# Patient Record
Sex: Female | Born: 1945
Health system: Southern US, Community
[De-identification: ages and names within clinical notes are randomized; demographics above are authoritative.]

## PROBLEM LIST (undated history)

## (undated) ENCOUNTER — Emergency Department (HOSPITAL_COMMUNITY): Admission: EM | Discharge status: Against Medical Advice | Payer: PPO | Source: Home / Self Care

## (undated) DIAGNOSIS — I7121 Aneurysm of the ascending aorta, without rupture: Secondary | ICD-10-CM

## (undated) DIAGNOSIS — E039 Hypothyroidism, unspecified: Secondary | ICD-10-CM

## (undated) DIAGNOSIS — I739 Peripheral vascular disease, unspecified: Secondary | ICD-10-CM

## (undated) DIAGNOSIS — I712 Thoracic aortic aneurysm, without rupture: Secondary | ICD-10-CM

## (undated) DIAGNOSIS — N289 Disorder of kidney and ureter, unspecified: Secondary | ICD-10-CM

## (undated) DIAGNOSIS — I2510 Atherosclerotic heart disease of native coronary artery without angina pectoris: Secondary | ICD-10-CM

## (undated) DIAGNOSIS — I779 Disorder of arteries and arterioles, unspecified: Secondary | ICD-10-CM

## (undated) DIAGNOSIS — I4510 Unspecified right bundle-branch block: Secondary | ICD-10-CM

## (undated) DIAGNOSIS — E785 Hyperlipidemia, unspecified: Secondary | ICD-10-CM

## (undated) DIAGNOSIS — Z789 Other specified health status: Secondary | ICD-10-CM

## (undated) DIAGNOSIS — I351 Nonrheumatic aortic (valve) insufficiency: Secondary | ICD-10-CM

## (undated) DIAGNOSIS — R011 Cardiac murmur, unspecified: Secondary | ICD-10-CM

## (undated) HISTORY — PX: ABDOMINAL HYSTERECTOMY: SHX81

## (undated) HISTORY — DX: Nonrheumatic aortic (valve) insufficiency: I35.1

## (undated) HISTORY — DX: Disorder of kidney and ureter, unspecified: N28.9

## (undated) HISTORY — PX: TONSILLECTOMY: SUR1361

## (undated) HISTORY — DX: Other specified health status: Z78.9

## (undated) HISTORY — PX: BREAST EXCISIONAL BIOPSY: SUR124

## (undated) HISTORY — DX: Thoracic aortic aneurysm, without rupture: I71.2

## (undated) HISTORY — DX: Disorder of arteries and arterioles, unspecified: I77.9

## (undated) HISTORY — DX: Peripheral vascular disease, unspecified: I73.9

## (undated) HISTORY — DX: Unspecified right bundle-branch block: I45.10

## (undated) HISTORY — DX: Aneurysm of the ascending aorta, without rupture: I71.21

## (undated) HISTORY — DX: Hyperlipidemia, unspecified: E78.5

---

## 1999-11-13 ENCOUNTER — Emergency Department (HOSPITAL_COMMUNITY): Admission: EM | Admit: 1999-11-13 | Discharge: 1999-11-14 | Payer: Self-pay | Admitting: Emergency Medicine

## 2000-05-26 ENCOUNTER — Other Ambulatory Visit: Admission: RE | Admit: 2000-05-26 | Discharge: 2000-05-26 | Payer: Self-pay | Admitting: Gynecology

## 2001-06-22 ENCOUNTER — Ambulatory Visit (HOSPITAL_COMMUNITY): Admission: RE | Admit: 2001-06-22 | Discharge: 2001-06-22 | Payer: Self-pay | Admitting: *Deleted

## 2001-06-22 ENCOUNTER — Encounter: Payer: Self-pay | Admitting: *Deleted

## 2002-11-07 ENCOUNTER — Encounter: Payer: Self-pay | Admitting: Family Medicine

## 2002-11-07 ENCOUNTER — Ambulatory Visit (HOSPITAL_COMMUNITY): Admission: RE | Admit: 2002-11-07 | Discharge: 2002-11-07 | Payer: Self-pay | Admitting: Family Medicine

## 2002-12-13 ENCOUNTER — Encounter: Payer: Self-pay | Admitting: Family Medicine

## 2002-12-13 ENCOUNTER — Ambulatory Visit (HOSPITAL_COMMUNITY): Admission: RE | Admit: 2002-12-13 | Discharge: 2002-12-13 | Payer: Self-pay | Admitting: Family Medicine

## 2011-01-20 ENCOUNTER — Encounter: Payer: Self-pay | Admitting: Internal Medicine

## 2011-01-21 ENCOUNTER — Encounter (HOSPITAL_COMMUNITY): Admission: RE | Admit: 2011-01-21 | Payer: Self-pay | Source: Home / Self Care | Admitting: Internal Medicine

## 2011-01-29 ENCOUNTER — Encounter: Payer: Self-pay | Admitting: Internal Medicine

## 2011-01-29 ENCOUNTER — Other Ambulatory Visit (HOSPITAL_COMMUNITY): Payer: Self-pay | Admitting: Internal Medicine

## 2011-01-29 DIAGNOSIS — E0511 Thyrotoxicosis with toxic single thyroid nodule with thyrotoxic crisis or storm: Secondary | ICD-10-CM

## 2011-02-13 ENCOUNTER — Ambulatory Visit (HOSPITAL_COMMUNITY)
Admission: RE | Admit: 2011-02-13 | Discharge: 2011-02-13 | Disposition: A | Discharge status: Home / Self Care | Payer: Medicare Other | Source: Ambulatory Visit | Attending: Internal Medicine | Admitting: Internal Medicine

## 2011-02-13 DIAGNOSIS — E0511 Thyrotoxicosis with toxic single thyroid nodule with thyrotoxic crisis or storm: Secondary | ICD-10-CM

## 2011-02-14 ENCOUNTER — Ambulatory Visit (HOSPITAL_COMMUNITY)
Admission: RE | Admit: 2011-02-14 | Discharge: 2011-02-14 | Disposition: A | Discharge status: Home / Self Care | Payer: Medicare Other | Source: Ambulatory Visit | Attending: Internal Medicine | Admitting: Internal Medicine

## 2011-02-14 DIAGNOSIS — E0501 Thyrotoxicosis with diffuse goiter with thyrotoxic crisis or storm: Secondary | ICD-10-CM | POA: Insufficient documentation

## 2011-02-14 MED ORDER — SODIUM PERTECHNETATE TC 99M INJECTION
10.0000 | Freq: Once | INTRAVENOUS | Status: AC | PRN
  Administered 2011-02-14: 10 via INTRAVENOUS

## 2011-02-14 MED ORDER — SODIUM IODIDE I 131 CAPSULE: 0.0100 | Freq: Once | INTRAVENOUS | Status: AC | PRN

## 2011-07-12 ENCOUNTER — Emergency Department (HOSPITAL_COMMUNITY)
Admission: EM | Admit: 2011-07-12 | Discharge: 2011-07-12 | Disposition: A | Discharge status: Home / Self Care | Payer: Medicare Other | Attending: Emergency Medicine | Admitting: Emergency Medicine

## 2011-07-12 ENCOUNTER — Emergency Department (HOSPITAL_COMMUNITY): Payer: Medicare Other

## 2011-07-12 ENCOUNTER — Other Ambulatory Visit (HOSPITAL_COMMUNITY): Payer: Medicare Other

## 2011-07-12 DIAGNOSIS — Z79899 Other long term (current) drug therapy: Secondary | ICD-10-CM | POA: Insufficient documentation

## 2011-07-12 DIAGNOSIS — M542 Cervicalgia: Secondary | ICD-10-CM | POA: Insufficient documentation

## 2011-07-12 DIAGNOSIS — E7800 Pure hypercholesterolemia, unspecified: Secondary | ICD-10-CM | POA: Insufficient documentation

## 2011-07-12 DIAGNOSIS — J029 Acute pharyngitis, unspecified: Secondary | ICD-10-CM | POA: Insufficient documentation

## 2011-07-12 LAB — CBC
Hemoglobin: 12.8 g/dL (ref 12.0–15.0)
MCV: 87.1 fL (ref 78.0–100.0)
Platelets: 314 10*3/uL (ref 150–400)
RBC: 4.35 MIL/uL (ref 3.87–5.11)
WBC: 9.7 10*3/uL (ref 4.0–10.5)

## 2011-07-12 LAB — DIFFERENTIAL
Eosinophils Absolute: 0.2 10*3/uL (ref 0.0–0.7)
Lymphocytes Relative: 23 % (ref 12–46)
Lymphs Abs: 2.2 10*3/uL (ref 0.7–4.0)
Neutro Abs: 6.2 10*3/uL (ref 1.7–7.7)
Neutrophils Relative %: 63 % (ref 43–77)

## 2011-07-12 LAB — POCT I-STAT, CHEM 8
Chloride: 105 mEq/L (ref 96–112)
HCT: 39 % (ref 36.0–46.0)
Potassium: 4.3 mEq/L (ref 3.5–5.1)
Sodium: 141 mEq/L (ref 135–145)

## 2011-07-12 MED ORDER — IOHEXOL 300 MG/ML  SOLN
75.0000 mL | Freq: Once | INTRAMUSCULAR | Status: AC | PRN
  Administered 2011-07-12: 75 mL via INTRAVENOUS

## 2011-07-12 MED ORDER — GADOBENATE DIMEGLUMINE 529 MG/ML IV SOLN: 20.0000 mL | Freq: Once | INTRAVENOUS | Status: DC | PRN

## 2012-07-30 ENCOUNTER — Other Ambulatory Visit: Payer: Self-pay | Admitting: Cardiology

## 2012-08-02 ENCOUNTER — Other Ambulatory Visit: Payer: Self-pay | Admitting: Cardiology

## 2012-08-02 ENCOUNTER — Encounter: Payer: Self-pay | Admitting: Cardiology

## 2012-08-02 NOTE — H&P (Signed)
Office Visit     Patient: Miranda Hutchinson, Miranda Hutchinson Provider: Armanda Magic, MD  DOB: 05-May-1946   Age: 66 Y   Sex: Female Date: 07/30/2012  Phone: [phone]  Address: [address] [place], [id number]  Pcp: DAVID SWAYNE    --------------------------------------------------------------------------------  Subjective:    CC:      1. TT/Stress Test F/u.      HPI:     General:           The patient presents today for followup of her stress test. She was having exertional chest pain and underwent nuclear stress test which showed anterolateral and inferolateral ischemia. She says she has not had any further pain but has not been exercising. She also complains of exertional fatigue..      ROS:      See HPI, A twelve system review was perfomed at today's visit. For pertinent positives and negatives see HPI.     Medical History: Hyperlipidemia, Heart Murmur - mild AI and dilated aortic root by echo 2010, Hyperthyroidism.      Family History:  Father: deceased Unknown Mother: deceased Lung Cancer, high cholesterol Paternal Grand Father: deceased Unknown Paternal Grand Mother: deceased Unknown Maternal Grand Father: deceased Unknown Maternal Grand Mother: deceased Stroke Maternal uncle: deceased Diabetes Mellitus, high cholesterol Maternal aunt: alive Diabetes Mellitus, high cholesterol Daughter(s): Hypothyroidism      Social History:      General:          History of smoking  cigarettes: Former smoker, Quit in year 1963.           no Smoking.          no Tobacco Exposure.          no Alcohol.          Caffeine: yes, very little.          no Recreational drug use.          no Exercise.          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          Marital Status: widowed.          Children: 1.      Medications: MVI 1 tablet Once a day, Biotin 5000 5 MG Capsule 1 capsule Once a day, Crestor 20 MG Tablet 1 tablet Once a day, Cod Liver Oil 1250-135 UNIT Capsule 1 capsule Once  a day, Medication List reviewed and reconciled with the patient     Allergies: Aspirin: Hurts Stomach: Side Effects.      Objective:    Vitals: Wt 217.4, Wt change -.8 lb, Ht 66, BMI 35.09, Pulse sitting 80, BP sitting 164/74.     Examination:     Cardiology, General:         GENERAL APPEARANCE: pleasant, NAD.          HEENT: unremarkable.          CAROTID UPSTROKE: normal, no bruit.          JVD: flat.          HEART SOUNDS: regular, normal S1, S2, no S3 or S4.          MURMUR: absent.          LUNGS: no rales or wheezes.          ABDOMEN: soft, non tender, positive bowel sounds, no masses felt.          EXTREMITIES: no leg edema.  PERIPHERAL PULSES: 2 plus bilateral.            Assessment:    Assessment:  1. Chest pain - 786.50 (Primary)   2. Abnormal cardiovascular function study - 794.30     Plan:    1. Chest pain   I have recommended since she is still having exertional fatigue and SOB and an abnormal nuclear stress test that we proceed with cardiac cath. , Risks and benefits of cardiac catheterization have been reviewed including risk of stroke, heart attack, death, bleeding, renal impariment and arterial damage. There was ample oppurtuny to answer questions. Alternatives were discussed. Patient understands and wishes to proceed.  The entire OV was used to discuss the results of the stress test, review recommendations and discuss heart cath.       2. Abnormal cardiovascular function study        LAB: PT (Prothrombin Time) (161096) (Ordered for 07/30/2012)  Normal     Prothrombin Time 10.8 9.1-12.0 - SEC        INR 1.0 0.8-1.2 -               Evynn Boutelle M 08/01/2012 09:33:15 PM > Corson,Danielle 08/02/2012 09:55:02 AM > PT aware Via VM per DPR.        LAB: Basic Metabolic (Ordered for 07/30/2012)  Normal     GLUCOSE 94 70-99 - mg/dL        BUN 15 0-45 - mg/dL        CREATININE 4.09 0.60-1.30 - mg/dl        eGFR (NON-AFRICAN AMERICAN) 65 >60 - calc         eGFR (AFRICAN AMERICAN) 78 >60 - calc        SODIUM 141 136-145 - mmol/L        POTASSIUM 4.3 3.5-5.5 - mmol/L        CHLORIDE 106 98-107 - mmol/L        C02 27 22-32 - mg/dL        ANION GAP 81.1 9.1-47.8 - mmol/L        CALCIUM 9.7 8.6-10.3 - mg/dL               Analis Distler M 07/30/2012 11:06:39 AM > Corson,Danielle 07/30/2012 11:54:32 AM > Pt aware Via VM Per pts DPR.        LAB: CBC with Diff (Ordered for 07/30/2012)  Normal     WBC 5.3 4.0-11.0 - K/ul        RBC 4.40 4.20-5.40 - M/uL        HGB 12.9 12.0-16.0 - g/dL        HCT 29.5 62.1-30.8 - %        MCH 29.4 27.0-33.0 - pg        MPV 9.4 7.5-10.7 - fL        MCV 88.5 81.0-99.0 - fL        MCHC 33.2 32.0-36.0 - g/dL        RDW 65.7 84.6-96.2 - %        NRBC# 0.00 -        PLT 287 150-400 - K/uL        NEUT % 47.0 43.3-71.9 - %        NRBC% 0.00 - %        LYMPH% 37.9 16.8-43.5 - %        MONO % 9.5 4.6-12.4 - %        EOS % 4.5 0.0-7.8 - %  BASO % 1.1 0.0-1.0 - % H       NEUT # 2.5 1.9-7.2 - K/uL        LYMPH# 2.00 1.10-2.70 - K/uL        MONO # 0.5 0.3-0.8 - K/uL        EOS # 0.2 0.0-0.6 - K/uL        BASO # 0.1 0.0-0.1 - K/uL               Faryn Sieg M 07/30/2012 10:38:48 AM > Corson,Danielle 07/30/2012 11:54:32 AM > Pt aware Via VM Per pts DPR        LAB: PT (Prothrombin Time) (409811) (Ordered for 07/30/2012)  Normal     Prothrombin Time 10.8 9.1-12.0 - SEC        INR 1.0 0.8-1.2 -               Laneisha Mino M 08/01/2012 09:33:15 PM > Corson,Danielle 08/02/2012 09:55:02 AM > PT aware Via VM per DPR.        LAB: Basic Metabolic (Ordered for 07/30/2012)  Normal     GLUCOSE 94 70-99 - mg/dL        BUN 15 9-14 - mg/dL        CREATININE 7.82 0.60-1.30 - mg/dl        eGFR (NON-AFRICAN AMERICAN) 65 >60 - calc        eGFR (AFRICAN AMERICAN) 78 >60 - calc        SODIUM 141 136-145 - mmol/L        POTASSIUM 4.3 3.5-5.5 - mmol/L        CHLORIDE 106 98-107 - mmol/L        C02 27 22-32 - mg/dL        ANION GAP  95.6 6.0-20.0 - mmol/L        CALCIUM 9.7 8.6-10.3 - mg/dL               Xavius Spadafore M 07/30/2012 11:06:39 AM > Corson,Danielle 07/30/2012 11:54:32 AM > Pt aware Via VM Per pts DPR.        LAB: CBC with Diff (Ordered for 07/30/2012)  Normal     WBC 5.3 4.0-11.0 - K/ul        RBC 4.40 4.20-5.40 - M/uL        HGB 12.9 12.0-16.0 - g/dL        HCT 21.3 08.6-57.8 - %        MCH 29.4 27.0-33.0 - pg        MPV 9.4 7.5-10.7 - fL        MCV 88.5 81.0-99.0 - fL        MCHC 33.2 32.0-36.0 - g/dL        RDW 46.9 62.9-52.8 - %        NRBC# 0.00 -        PLT 287 150-400 - K/uL        NEUT % 47.0 43.3-71.9 - %        NRBC% 0.00 - %        LYMPH% 37.9 16.8-43.5 - %         % 9.5 4.6-12.4 - %        EOS % 4.5 0.0-7.8 - %         BASO % 1.1 0.0-1.0 - % H       NEUT # 2.5 1.9-7.2 - K/uL        LYMPH# 2.00 1.10-2.70 - K/uL  MONO # 0.5 0.3-0.8 - K/uL        EOS # 0.2 0.0-0.6 - K/uL        BASO # 0.1 0.0-0.1 - K/uL               Kurtis Anastasia M 07/30/2012 10:38:48 AM > Corson,Danielle 07/30/2012 11:54:32 AM > Pt aware Via VM Per pts DPR         Immunizations:       Labs:      Procedure Codes: 16109 BLOOD COLLECTION ROUTINE VENIPUNCTURE, 80048 ECL BMP, 85025 ECL CBC PLATELET DIFF     Preventive:           Follow Up: cath        Provider: Armanda Magic, MD  Patient: Miranda Hutchinson, Miranda Hutchinson  DOB: September 04, 1946  Date: 07/30/2012

## 2012-08-04 ENCOUNTER — Encounter (HOSPITAL_BASED_OUTPATIENT_CLINIC_OR_DEPARTMENT_OTHER)
Admission: RE | Disposition: A | Discharge status: Hospice | Payer: Self-pay | Source: Ambulatory Visit | Attending: Cardiology

## 2012-08-04 ENCOUNTER — Inpatient Hospital Stay (HOSPITAL_BASED_OUTPATIENT_CLINIC_OR_DEPARTMENT_OTHER)
Admission: RE | Admit: 2012-08-04 | Discharge: 2012-08-04 | Disposition: A | Discharge status: Hospice | Payer: Medicare Other | Source: Ambulatory Visit | Attending: Cardiology | Admitting: Cardiology

## 2012-08-04 ENCOUNTER — Other Ambulatory Visit: Payer: Self-pay

## 2012-08-04 ENCOUNTER — Encounter (HOSPITAL_COMMUNITY): Payer: Self-pay | Admitting: General Practice

## 2012-08-04 ENCOUNTER — Inpatient Hospital Stay (HOSPITAL_COMMUNITY)
Admission: AD | Admit: 2012-08-04 | Discharge: 2012-08-17 | DRG: 217 | Disposition: A | Discharge status: Home / Self Care | Payer: Medicare Other | Source: Ambulatory Visit | Attending: Cardiothoracic Surgery | Admitting: Cardiothoracic Surgery

## 2012-08-04 DIAGNOSIS — B3749 Other urogenital candidiasis: Secondary | ICD-10-CM | POA: Diagnosis not present

## 2012-08-04 DIAGNOSIS — Z951 Presence of aortocoronary bypass graft: Secondary | ICD-10-CM

## 2012-08-04 DIAGNOSIS — I712 Thoracic aortic aneurysm, without rupture, unspecified: Secondary | ICD-10-CM | POA: Diagnosis present

## 2012-08-04 DIAGNOSIS — I2510 Atherosclerotic heart disease of native coronary artery without angina pectoris: Secondary | ICD-10-CM

## 2012-08-04 DIAGNOSIS — E785 Hyperlipidemia, unspecified: Secondary | ICD-10-CM | POA: Diagnosis present

## 2012-08-04 DIAGNOSIS — D62 Acute posthemorrhagic anemia: Secondary | ICD-10-CM | POA: Diagnosis not present

## 2012-08-04 DIAGNOSIS — I200 Unstable angina: Secondary | ICD-10-CM | POA: Diagnosis present

## 2012-08-04 DIAGNOSIS — I77810 Thoracic aortic ectasia: Secondary | ICD-10-CM | POA: Diagnosis present

## 2012-08-04 DIAGNOSIS — I10 Essential (primary) hypertension: Secondary | ICD-10-CM | POA: Diagnosis present

## 2012-08-04 DIAGNOSIS — E039 Hypothyroidism, unspecified: Secondary | ICD-10-CM | POA: Diagnosis present

## 2012-08-04 DIAGNOSIS — E7800 Pure hypercholesterolemia, unspecified: Secondary | ICD-10-CM | POA: Diagnosis present

## 2012-08-04 DIAGNOSIS — Y9210 Unspecified residential institution as the place of occurrence of the external cause: Secondary | ICD-10-CM | POA: Diagnosis not present

## 2012-08-04 DIAGNOSIS — T40605A Adverse effect of unspecified narcotics, initial encounter: Secondary | ICD-10-CM | POA: Diagnosis not present

## 2012-08-04 DIAGNOSIS — E669 Obesity, unspecified: Secondary | ICD-10-CM | POA: Diagnosis present

## 2012-08-04 DIAGNOSIS — Z952 Presence of prosthetic heart valve: Secondary | ICD-10-CM

## 2012-08-04 DIAGNOSIS — Z95828 Presence of other vascular implants and grafts: Secondary | ICD-10-CM

## 2012-08-04 DIAGNOSIS — R51 Headache: Secondary | ICD-10-CM | POA: Diagnosis not present

## 2012-08-04 DIAGNOSIS — D696 Thrombocytopenia, unspecified: Secondary | ICD-10-CM | POA: Diagnosis not present

## 2012-08-04 DIAGNOSIS — R519 Headache, unspecified: Secondary | ICD-10-CM | POA: Diagnosis not present

## 2012-08-04 DIAGNOSIS — Z7982 Long term (current) use of aspirin: Secondary | ICD-10-CM

## 2012-08-04 DIAGNOSIS — I351 Nonrheumatic aortic (valve) insufficiency: Secondary | ICD-10-CM | POA: Diagnosis present

## 2012-08-04 DIAGNOSIS — E8779 Other fluid overload: Secondary | ICD-10-CM | POA: Diagnosis not present

## 2012-08-04 DIAGNOSIS — I359 Nonrheumatic aortic valve disorder, unspecified: Principal | ICD-10-CM | POA: Diagnosis present

## 2012-08-04 DIAGNOSIS — Z79899 Other long term (current) drug therapy: Secondary | ICD-10-CM

## 2012-08-04 DIAGNOSIS — L270 Generalized skin eruption due to drugs and medicaments taken internally: Secondary | ICD-10-CM | POA: Diagnosis not present

## 2012-08-04 HISTORY — PX: CARDIAC CATHETERIZATION: SHX172

## 2012-08-04 HISTORY — DX: Hypothyroidism, unspecified: E03.9

## 2012-08-04 HISTORY — DX: Cardiac murmur, unspecified: R01.1

## 2012-08-04 LAB — COMPREHENSIVE METABOLIC PANEL
ALT: 13 U/L (ref 0–35)
AST: 15 U/L (ref 0–37)
Alkaline Phosphatase: 58 U/L (ref 39–117)
CO2: 27 mEq/L (ref 19–32)
Calcium: 9.2 mg/dL (ref 8.4–10.5)
Chloride: 107 mEq/L (ref 96–112)
GFR calc Af Amer: 73 mL/min — ABNORMAL LOW (ref >90)
GFR calc non Af Amer: 63 mL/min — ABNORMAL LOW (ref >90)
Glucose, Bld: 143 mg/dL — ABNORMAL HIGH (ref 70–99)
Sodium: 141 mEq/L (ref 135–145)
Total Bilirubin: 0.3 mg/dL (ref 0.3–1.2)

## 2012-08-04 LAB — CBC WITH DIFFERENTIAL/PLATELET
Eosinophils Absolute: 0.2 10*3/uL (ref 0.0–0.7)
Hemoglobin: 12.2 g/dL (ref 12.0–15.0)
Lymphocytes Relative: 36 % (ref 12–46)
Lymphs Abs: 2.1 10*3/uL (ref 0.7–4.0)
MCH: 28.7 pg (ref 26.0–34.0)
MCV: 87.5 fL (ref 78.0–100.0)
Monocytes Relative: 8 % (ref 3–12)
Neutrophils Relative %: 52 % (ref 43–77)
RBC: 4.25 MIL/uL (ref 3.87–5.11)
WBC: 5.9 10*3/uL (ref 4.0–10.5)

## 2012-08-04 LAB — PROTIME-INR: INR: 1.05 (ref 0.00–1.49)

## 2012-08-04 SURGERY — JV LEFT HEART CATHETERIZATION WITH CORONARY ANGIOGRAM
Anesthesia: Moderate Sedation

## 2012-08-04 MED ORDER — ONDANSETRON HCL 4 MG/2ML IJ SOLN: 4.0000 mg | Freq: Four times a day (QID) | INTRAMUSCULAR | Status: DC | PRN

## 2012-08-04 MED ORDER — ASPIRIN 81 MG PO CHEW: 324.0000 mg | CHEWABLE_TABLET | ORAL | Status: DC

## 2012-08-04 MED ORDER — ATORVASTATIN CALCIUM 40 MG PO TABS
40.0000 mg | ORAL_TABLET | Freq: Every day | ORAL | Status: DC
  Administered 2012-08-04 – 2012-08-16 (×12): 40 mg via ORAL
  Filled 2012-08-04 (×15): qty 1

## 2012-08-04 MED ORDER — NITROGLYCERIN IN D5W 200-5 MCG/ML-% IV SOLN
3.0000 ug/min | INTRAVENOUS | Status: DC
  Administered 2012-08-04: 10 ug/min via INTRAVENOUS

## 2012-08-04 MED ORDER — SODIUM CHLORIDE 0.9 % IV SOLN: 1.0000 mL/kg/h | INTRAVENOUS | Status: DC

## 2012-08-04 MED ORDER — SODIUM CHLORIDE 0.9 % IJ SOLN: 3.0000 mL | INTRAMUSCULAR | Status: DC | PRN

## 2012-08-04 MED ORDER — SODIUM CHLORIDE 0.9 % IV SOLN: INTRAVENOUS | Status: DC

## 2012-08-04 MED ORDER — SODIUM CHLORIDE 0.9 % IV SOLN: 250.0000 mL | INTRAVENOUS | Status: DC | PRN

## 2012-08-04 MED ORDER — SODIUM CHLORIDE 0.9 % IJ SOLN: 3.0000 mL | Freq: Two times a day (BID) | INTRAMUSCULAR | Status: DC

## 2012-08-04 MED ORDER — ASPIRIN EC 81 MG PO TBEC
81.0000 mg | DELAYED_RELEASE_TABLET | Freq: Every day | ORAL | Status: DC
  Administered 2012-08-05 – 2012-08-09 (×5): 81 mg via ORAL
  Filled 2012-08-04 (×6): qty 1

## 2012-08-04 MED ORDER — ACETAMINOPHEN 325 MG PO TABS
650.0000 mg | ORAL_TABLET | ORAL | Status: DC | PRN
  Administered 2012-08-04 – 2012-08-05 (×2): 650 mg via ORAL
  Filled 2012-08-04 (×2): qty 2

## 2012-08-04 MED ORDER — METOPROLOL TARTRATE 25 MG PO TABS
25.0000 mg | ORAL_TABLET | Freq: Two times a day (BID) | ORAL | Status: DC
  Administered 2012-08-04 – 2012-08-09 (×11): 25 mg via ORAL
  Filled 2012-08-04 (×13): qty 1

## 2012-08-04 MED ORDER — ACETAMINOPHEN 325 MG PO TABS: 650.0000 mg | ORAL_TABLET | ORAL | Status: DC | PRN

## 2012-08-04 MED ORDER — HEPARIN (PORCINE) IN NACL 100-0.45 UNIT/ML-% IJ SOLN
1350.0000 [IU]/h | INTRAMUSCULAR | Status: DC
  Administered 2012-08-04: 1000 [IU]/h via INTRAVENOUS
  Administered 2012-08-05: 1350 [IU]/h via INTRAVENOUS
  Filled 2012-08-04 (×2): qty 250

## 2012-08-04 MED ORDER — NITROGLYCERIN IN D5W 200-5 MCG/ML-% IV SOLN
10.0000 ug/min | INTRAVENOUS | Status: DC
  Administered 2012-08-04: 5 ug/min via INTRAVENOUS

## 2012-08-04 MED ORDER — NITROGLYCERIN 0.4 MG SL SUBL: 0.4000 mg | SUBLINGUAL_TABLET | SUBLINGUAL | Status: DC | PRN

## 2012-08-04 MED ORDER — DIAZEPAM 5 MG PO TABS
5.0000 mg | ORAL_TABLET | ORAL | Status: AC
  Administered 2012-08-04: 5 mg via ORAL

## 2012-08-04 NOTE — Progress Notes (Signed)
Patient ID: Miranda Hutchinson, female   DOB: 14-Apr-1946, 66 y.o.   MRN: [id number]                    [address]            [place] [address]          [phone]       ANNESSA SATRE [facility] Medical Record #[id number] Date of Birth: February 07, 1946  Referring: Dr Armanda Magic Primary Care: Dr Leitha Bleak Chief Complaint:   exersional chest pain positive stress test   History of Present Illness:      The patient presents today for cardiac cath as outpatient in followup of her stress test. She has  having exertional chest pain, denies rest pain. A nuclear stress test in [place] office   showed anterolateral and inferolateral ischemia. She also complains of exertional fatigue. No previous history of known CAD or MI. Has history of dilated aortic root since 2010 by echo but do not have further information about this evaluation follow up, patient unaware of DX.    Current Activity/ Functional Status: Patient is  independent with mobility/ambulation, transfers, ADL's, IADL's.   Past Medical History  Diagnosis Date  . Dyslipidemia   . Aortic insufficiency   . Hyperthyroidism     Past Surgical History: Hysterectomy  History  Smoking status   Quit smoking in 1963  Smokeless tobacco  . Not used    History  Alcohol Use: none    History   Social History  . Marital Status: widowed    Spouse Name: N/A    Number of Children: 1  . Years of Education: N/A   Occupational History  . Retired worked as Firefighter in [employer]                     Allergies  Allergen Reactions  . Asa (Aspirin) Nausea Only    Current Facility-Administered Medications  Medication Dose Route Frequency Provider Last Rate Last Dose  . acetaminophen (TYLENOL) tablet 650 mg  650 mg Oral Q4H PRN Quintella Reichert, MD   650 mg at 08/04/12 1905  . aspirin EC tablet 81 mg  81 mg Oral Daily Quintella Reichert, MD      . atorvastatin (LIPITOR) tablet 40 mg  40 mg Oral q1800 Quintella Reichert, MD   40 mg at 08/04/12 1732  . heparin ADULT infusion 100 units/mL (25000 units/250 mL)  1,000 Units/hr Intravenous Continuous Riki Rusk, PHARMD 10 mL/hr at 08/04/12 1901 1,000 Units/hr at 08/04/12 1901  . metoprolol tartrate (LOPRESSOR) tablet 25 mg  25 mg Oral BID Quintella Reichert, MD      . nitroGLYCERIN (NITROSTAT) SL tablet 0.4 mg  0.4 mg Sublingual Q5 Min x 3 PRN Quintella Reichert, MD      . nitroGLYCERIN 0.2 mg/mL in dextrose 5 % infusion  3-30 mcg/min Intravenous Titrated Quintella Reichert, MD 3 mL/hr at 08/04/12 1601 10 mcg/min at 08/04/12 1601  . ondansetron (ZOFRAN) injection 4 mg  4 mg Intravenous Q6H PRN Quintella Reichert, MD         Prescriptions prior to admission  Medication Sig Dispense Refill  . Biotin (BIOTIN 5000) 5 MG CAPS Take 1 capsule by mouth daily.      Marland Kitchen Cod Liver Oil 1250-135 UNITS CAPS Take 1 capsule by mouth daily.      . rosuvastatin (CRESTOR) 20 MG  tablet Take 20 mg by mouth every morning.        Family History: Father's medical history unknown, Mother lung cancer and DM, Daughter DM No family history of sudden death or dissection  Review of Systems:     Cardiac Review of Systems: Y or N  Chest Pain [ y   ]  Resting SOB [ n  ] Exertional SOB  [ y ]  Pollyann Kennedy Milo.Brash  ]   Pedal Edema [ y  ]    Palpitations [ n ] Syncope  [n  ]   Presyncope [n   ]  General Review of Systems: [Y] = yes [  ]=no Constitional: recent weight change [ n ]; anorexia [  ]; fatigue [ y ]; nausea [ n ]; night sweats [ n ]; fever [  n]; or chills [n  ];                                                                                                                                         Dental: poor dentition[y upper plate native teeth lower  ]; Last Dentist visit: 7 years ago, patient unaware of dental prophylaxis and need for dental care with abnormal valve  Eye : blurred vision [  ]; diplopia [   ]; vision changes [  ];  Amaurosis fugax[ n ]; Resp: cough [  ];  wheezing[n  ];   hemoptysis[  ]; shortness of breath[ n ]; paroxysmal nocturnal dyspnea[  ]; dyspnea on exertion[  ]; or orthopnea[  ];  GI:  gallstones[  ], vomiting[  ];  dysphagia[  ]; melena[  ];  hematochezia [  ]; heartburn[  ];   Hx of  Colonoscopy[ yes two years ago one small pylop ]; GU: kidney stones [  ]; hematuria[  ];   dysuria [  ];  nocturia[  ];  history of     obstruction [  ];             Skin: rash, swelling[  ];, hair loss[  ];  peripheral edema[  ];  or itching[  ]; Musculosketetal: myalgias[  ];  joint swelling[  ];  joint erythema[  ];  joint pain[  ];  back pain[  ];  Heme/Lymph: bruising[ n ];  bleeding[ n ];  anemia[n  ];  Neuro: TIA[ n ];  headaches[n  ];  stroke[n  ];  vertigo[n  ];  seizures[  ];   paresthesias[  ];  difficulty walking[  ];  Psych:depression[  ]; anxiety[  ];  Endocrine: diabetes[  ];  thyroid dysfunction[y  ];  Immunizations: Flu [ y ]; Pneumococcal[ n ];  Other:  Physical Exam: BP 161/61  Pulse 70  Temp 98.5 F (36.9 C) (Oral)  Resp 18  Ht 5\' 6"  (1.676 m)  Wt 217 lb (98.431 kg)  BMI 35.02 kg/m2  SpO2  100%  General appearance: alert, cooperative, appears stated age, no distress and moderately obese Neurologic: intact Heart: regular rate and rhythm, diastolic murmur: holodiastolic 3/6, blowing at lower left sternal border, no click and no rub Lungs: clear to auscultation bilaterally and normal percussion bilaterally Abdomen: soft, non-tender; bowel sounds normal; no masses,  no organomegaly Extremities: extremities normal, atraumatic, no cyanosis or edema and Homans sign is negative, no sign of DVT Wound: cath site ok no carotid bruits   Diagnostic Studies & Laboratory data:     Recent Radiology Findings:   No results found.  Have order CTA of chest and aortic root   Recent Lab Findings: Lab Results  Component Value Date   WBC 5.9 08/04/2012   HGB 12.2 08/04/2012   HCT 37.2 08/04/2012   PLT 289 08/04/2012   GLUCOSE 143* 08/04/2012   ALT 13 08/04/2012     AST 15 08/04/2012   NA 141 08/04/2012   K 4.1 08/04/2012   CL 107 08/04/2012   CREATININE 0.91 08/04/2012   BUN 12 08/04/2012   CO2 27 08/04/2012   INR 1.05 08/04/2012   Cath: PROCEDURE: Left heart catheterization with selective coronary angiography, left ventriculogram.  INDICATIONS:  The risks, benefits, and details of the procedure were explained to the patient. The patient verbalized understanding and wanted to proceed. Informed written consent was obtained.  PROCEDURE TECHNIQUE: After Xylocaine anesthesia a 55F sheath was placed in the right femoral artery with a single anterior needle wall stick. Left coronary angiography was done using a Judkins L4 guide catheter. Right coronary angiography was done using a Judkins R4 guide catheter. Left ventriculography was done using a pigtail catheter.  CONTRAST: Total of 140cc.  COMPLICATIONS: None.  HEMODYNAMICS: Aortic pressure was 162/30mmHg; LV pressure was 168/19mmHg; LVEDP . There was no significant gradient between the left ventricle and aorta.  ANGIOGRAPHIC DATA: The left main coronary artery is widely patent and bifurcates into an LAD and left circumflex arteries.  The left anterior descending artery is patent with a 50-60% stenosis in the mid vessel and gives rise to a first diagonal branch which has an ostial stenosis of 90% It then give rise to a second diagonal branch which is patent. This distal LAD has a 70-80% stenosis.  The left circumflex artery is is widely patent and gives rise to a large OM1 which has an 80% stenosis in the mid portion of the vessel.  The right coronary artery is diffusely diseased throughout the vessel. There is an aneurysm in the proximal portion and just distal to that there is an 80% stenosis. In the mid vessel there is a 70-80% stenosis. Distally it bifurcates in to a PDA and Pl branches which are patent.  LEFT VENTRICULOGRAM: Left ventricular angiogram was done in the 30 RAO projection and revealed normal left  ventricular wall motion and systolic function with an estimated ejection fraction of 60%. LVEDP was 22 mmHg.  ASCENDING AORTOGRAM: There is aneurysmal dilatation of the proximal aorta above the aortic valve. It measures cm in widest diameter.  IMPRESSIONS:  1. Normal left main coronary artery. 2. 90% ostial stenosis of first diagonal and 50-60% mid and 70-80% distal stenosis of the LAD 3. Normal left circumflex artery with 80% mid stenosis of large OM1 4. Diffusely diseased RCA with an aneurysm in the proximal portion followed by an 80% stenosis and a 70-80% stenosis in the mid vessel.  5. Normal left ventricular systolic function. LVEDP 22 mmHg. Ejection fraction 60%. RECOMMENDATION:  1.  Admit to telemtery bed.  2. IV Heparin gtt  3. IV NTG gtt for BP control  4. CVTS consult for 3 vessel ASCAD with ascending aortic aneurysm measuring 4.4cm by echo  5. Continue home meds    Assessment / Plan:   Symptomic  Coronary Artery Disease with positive stress test Aneurysmal  aortic root with normal appearing ascending aorta, with  moderate AI Need Cardiology to provide echo disk and load into cone system for review I have ordered a CTA/cardiac study to evaluate the size and morphology of the dilated  aortic root  Discussed with the patient CABG, aortic root replacement, possible aortic valve replacement next week  pending further workup    Delight Ovens MD  Beeper [phone] Office [phone] 08/04/2012 9:59 PM

## 2012-08-04 NOTE — OR Nursing (Signed)
Pt transported to 4738 via stretcher on monitor

## 2012-08-04 NOTE — OR Nursing (Signed)
Dr Turner at bedside to discuss results and treatment plan with pt and family 

## 2012-08-04 NOTE — Progress Notes (Signed)
ANTICOAGULATION CONSULT NOTE - Initial Consult  Pharmacy Consult for heparin Indication: chest pain/ACS  Allergies  Allergen Reactions  . Asa (Aspirin) Nausea Only    Patient Measurements: Height: 5\' 6"  (167.6 cm) Weight: 217 lb (98.431 kg) IBW/kg (Calculated) : 59.3  Heparin Dosing Weight: 82 kg  Vital Signs: BP: 184/69 mmHg (08/07 1456) Pulse Rate: 74  (08/07 1500)  Labs:  Basename 08/04/12 1619  HGB 12.2  HCT 37.2  PLT 289  APTT 35  LABPROT 13.9  INR 1.05  HEPARINUNFRC --  CREATININE 0.91  CKTOTAL --  CKMB --  TROPONINI --    Estimated Creatinine Clearance: 70 ml/min (by C-G formula based on Cr of 0.91).   Medical History: Past Medical History  Diagnosis Date  . Dyslipidemia   . Aortic insufficiency   . Hyperthyroidism     Medications:  Scheduled:    . aspirin EC  81 mg Oral Daily  . atorvastatin  40 mg Oral q1800  . metoprolol tartrate  25 mg Oral BID    Assessment: Miranda Hutchinson s/p cath with 3V CAD with ascending aortic aneurysm. Pharmacy is consulted to start heparin without bolus 5 hrs after sheath removal (1300). Pending CVTS consult. hgb 12.2, plts 289, baseline INR 1.05, aPTT 35  Goal of Therapy:  Heparin level 0.3-0.7 units/ml Monitor platelets by anticoagulation protocol: Yes   Plan:  - Start heparin infusion 1000 units/hr without bolus at 1800 - f/u 6 hr heparin level at midnight - daily heparin level and cbc with am labs.  Bayard Hugger, PharmD, BCPS  Clinical Pharmacist  Pager: [phone]  08/04/2012,5:24 PM

## 2012-08-04 NOTE — OR Nursing (Signed)
Meal served 

## 2012-08-04 NOTE — OR Nursing (Signed)
Report called to [place], RN on [phone]

## 2012-08-04 NOTE — CV Procedure (Signed)
PROCEDURE:  Left heart catheterization with selective coronary angiography, left ventriculogram.  INDICATIONS:    The risks, benefits, and details of the procedure were explained to the patient.  The patient verbalized understanding and wanted to proceed.  Informed written consent was obtained.  PROCEDURE TECHNIQUE:  After Xylocaine anesthesia a 72F sheath was placed in the right femoral artery with a single anterior needle wall stick.   Left coronary angiography was done using a Judkins L4 guide catheter.  Right coronary angiography was done using a Judkins R4 guide catheter.  Left ventriculography was done using a pigtail catheter.    CONTRAST:  Total of 140cc.  COMPLICATIONS:  None.    HEMODYNAMICS:  Aortic pressure was 162/24mmHg; LV pressure was 168/62mmHg; LVEDP .  There was no significant gradient between the left ventricle and aorta.    ANGIOGRAPHIC DATA:   The left main coronary artery is widely patent and bifurcates into an LAD and left circumflex arteries.  The left anterior descending artery is patent with a 50-60% stenosis in the mid vessel and gives rise to a first diagonal branch which has an ostial stenosis of 90%  It then give rise to a second diagonal branch which is patent.  This distal LAD has a 70-80% stenosis.    The left circumflex artery is is widely patent and gives rise to a large OM1 which has an 80% stenosis in the mid portion of the vessel.  The right coronary artery is diffusely diseased throughout the vessel.  There is an aneurysm in the proximal portion and just distal to that there is an 80% stenosis.  In the mid vessel there is a 70-80% stenosis.  Distally it bifurcates in to a PDA and Pl branches which are patent.  LEFT VENTRICULOGRAM:  Left ventricular angiogram was done in the 30 RAO projection and revealed normal left ventricular wall motion and systolic function with an estimated ejection fraction of 60%.  LVEDP was 22 mmHg.  ASCENDING AORTOGRAM:   There is aneurysmal dilatation of the proximal aorta above the aortic valve.  It measures cm in widest diameter.  IMPRESSIONS:  1. Normal left main coronary artery. 2. 90% ostial stenosis of first diagonal and 50-60% mid and 70-80% distal stenosis of the LAD 3. Normal left circumflex artery with 80% mid stenosis of large OM1 4. Diffusely diseased RCA with an aneurysm in the proximal portion followed by an 80% stenosis and a 70-80% stenosis in the mid vessel.   5. Normal left ventricular systolic function.  LVEDP 22 mmHg.  Ejection fraction 60%.  RECOMMENDATION:   1.  Admit to telemtery bed. 2.  IV Heparin gtt 3.  IV NTG gtt for BP control 4.  CVTS consult for 3 vessel ASCAD with ascending aortic aneurysm measuring 4.4cm by echo 5.  Continue home meds

## 2012-08-04 NOTE — OR Nursing (Signed)
Tegaderm dressing applied, site level 0, bedrest begins at 1320 

## 2012-08-05 ENCOUNTER — Encounter (HOSPITAL_COMMUNITY): Payer: Medicare Other

## 2012-08-05 ENCOUNTER — Inpatient Hospital Stay (HOSPITAL_COMMUNITY): Payer: Medicare Other

## 2012-08-05 DIAGNOSIS — E7800 Pure hypercholesterolemia, unspecified: Secondary | ICD-10-CM | POA: Diagnosis present

## 2012-08-05 DIAGNOSIS — E669 Obesity, unspecified: Secondary | ICD-10-CM | POA: Diagnosis present

## 2012-08-05 DIAGNOSIS — I10 Essential (primary) hypertension: Secondary | ICD-10-CM | POA: Diagnosis present

## 2012-08-05 DIAGNOSIS — Z01810 Encounter for preprocedural cardiovascular examination: Secondary | ICD-10-CM

## 2012-08-05 LAB — PULMONARY FUNCTION TEST

## 2012-08-05 LAB — CBC
HCT: 36.2 % (ref 36.0–46.0)
MCV: 88.1 fL (ref 78.0–100.0)
Platelets: 284 10*3/uL (ref 150–400)
RBC: 4.11 MIL/uL (ref 3.87–5.11)
WBC: 5.4 10*3/uL (ref 4.0–10.5)

## 2012-08-05 MED ORDER — AMLODIPINE BESYLATE 5 MG PO TABS
5.0000 mg | ORAL_TABLET | Freq: Every day | ORAL | Status: DC
  Administered 2012-08-05 – 2012-08-06 (×2): 5 mg via ORAL
  Filled 2012-08-05 (×2): qty 1

## 2012-08-05 MED ORDER — IOHEXOL 350 MG/ML SOLN
100.0000 mL | Freq: Once | INTRAVENOUS | Status: AC | PRN
  Administered 2012-08-05: 100 mL via INTRAVENOUS

## 2012-08-05 MED ORDER — TRAMADOL HCL 50 MG PO TABS
50.0000 mg | ORAL_TABLET | Freq: Four times a day (QID) | ORAL | Status: DC | PRN
  Administered 2012-08-06: 50 mg via ORAL
  Filled 2012-08-05: qty 1

## 2012-08-05 MED ORDER — HEPARIN (PORCINE) IN NACL 100-0.45 UNIT/ML-% IJ SOLN
1600.0000 [IU]/h | INTRAMUSCULAR | Status: DC
  Administered 2012-08-05 – 2012-08-06 (×2): 1600 [IU]/h via INTRAVENOUS
  Filled 2012-08-05 (×4): qty 250

## 2012-08-05 NOTE — Progress Notes (Signed)
Subjective:  Sitting comfortably in bed, no chest pain on heparin drip. No shortness of breath. Her main symptom was dyspnea on exertion which prompted cardiac catheterization which revealed three-vessel coronary artery disease. Aortic root 4.4 cm on echocardiogram. Awaiting CT scan.  Objective:  Vital Signs in the last 24 hours: Temp:  [98.2 F (36.8 C)-98.6 F (37 C)] 98.2 F (36.8 C) (08/08 0855) Pulse Rate:  [70-95] 74  (08/08 1034) Resp:  [16-20] 16  (08/08 0855) BP: (129-184)/(41-79) 162/51 mmHg (08/08 1034) SpO2:  [90 %-100 %] 98 % (08/08 0855) Weight:  [98.431 kg (217 lb)-98.44 kg (217 lb 0.3 oz)] 98.44 kg (217 lb 0.3 oz) (08/08 0500)  Intake/Output from previous day: 08/07 0701 - 08/08 0700 In: -  Out: 1350 [Urine:1350]   Physical Exam: General: Well developed, well nourished, in no acute distress. Head:  Normocephalic and atraumatic. Lungs: Clear to auscultation and percussion. Heart: Normal S1 and S2.  2/6 diastolic murmur left lower sternal border, no rubs or gallops.  Abdomen: soft, non-tender, positive bowel sounds. Obese Extremities: No clubbing or cyanosis. No edema. Neurologic: Alert and oriented x 3.    Lab Results:  Basename 08/05/12 1000 08/04/12 1619  WBC 5.4 5.9  HGB 12.1 12.2  PLT 284 289    Basename 08/04/12 1619  NA 141  K 4.1  CL 107  CO2 27  GLUCOSE 143*  BUN 12  CREATININE 0.91   No results found for this basename: TROPONINI:2,CK,MB:2 in the last 72 hours Hepatic Function Panel  Basename 08/04/12 1619  PROT 6.7  ALBUMIN 3.4*  AST 15  ALT 13  ALKPHOS 58  BILITOT 0.3  BILIDIR --  IBILI --   Telemetry: No adverse arrhythmias Personally viewed.   EKG:  Normal sinus rhythm, 65, nonspecific ST-T wave changes  Cardiac Studies:  CATH: 1. Normal left main coronary artery. 2. 90% ostial stenosis of first diagonal and 50-60% mid and 70-80% distal stenosis of the LAD 3. Normal left circumflex artery with 80% mid stenosis of large  OM1 4. Diffusely diseased RCA with an aneurysm in the proximal portion followed by an 80% stenosis and a 70-80% stenosis in the mid vessel.  5. Normal left ventricular systolic function. LVEDP 22 mmHg. Ejection fraction 60%  Previously documented mild aortic insufficiency and dilated aortic root by echocardiogram (44 mm by Dr. Norris Cross report).  Assessment/Plan:  Active Problems:  Coronary artery disease  Aortic valve insufficiency  Dilated aortic root  1. Severe coronary artery disease-currently on heparin IV, awaiting CT scan of aorta. Dr. Tyrone Sage. Bypass, aortic root replacement, possible aortic valve replacement next week pending workup. I reviewed his notes.  2. Dilated aortic root/aortic insufficiency-4.4 cm documented in Dr. Norris Cross catheterization report. We need to supply images for Dr. Dennie Maizes review.  3. HTN - currently on IV nitroglycerin. I do not see any antihypertensives on her medication list as an outpatient. Currently on metoprolol 25 mg twice a day. Starting amlodipine 5 mg a day.  4. Hyperlipidemia-continue Crestor. LDL goal 70.  5. Obesity-encourage weight loss. Low carbohydrate diet.   Andreana Klingerman 08/05/2012, 11:11 AM

## 2012-08-05 NOTE — Progress Notes (Signed)
ANTICOAGULATION CONSULT NOTE - Follow Up Consult  Pharmacy Consult for heparin Indication: chest pain/ACS  Labs:  [facility] 08/04/12 2315 08/04/12 1619  HGB -- 12.2  HCT -- 37.2  PLT -- 289  APTT -- 35  LABPROT -- 13.9  INR -- 1.05  HEPARINUNFRC <0.10* --  CREATININE -- 0.91  CKTOTAL -- --  CKMB -- --  TROPONINI -- --    Assessment: 66yo female undetectable on heparin started post cath for CAD; gtt started late and lab drawn early but likely needs higher rate.  Goal of Therapy:  Heparin level 0.3-0.7 units/ml   Plan:  Will increase heparin gtt by 4 units/kg/hr to 1350 units/hr and check level in 6hr.  Colleen Can PharmD BCPS 08/05/2012,2:10 AM

## 2012-08-05 NOTE — Progress Notes (Signed)
Pre-op Cardiac Surgery  Carotid Findings:  40-59% Distal Right ICA stenosis and 40-59% Mid Left ICA stenosis, by velocities only. Unable to clearly evaluate plaque morphology.   Pre CABG limbs still to be done.  Upper Extremity Right Left  Brachial Pressures    Radial Waveforms    Ulnar Waveforms    Palmar Arch (Allen's Test)     Findings:      Lower  Extremity Right Left  Dorsalis Pedis    Anterior Tibial    Posterior Tibial    Ankle/Brachial Indices      Findings:     Farrel Demark, RDMS, RVT

## 2012-08-05 NOTE — Progress Notes (Signed)
Heparin per Pharmacy Indication: chest pain/ACS  Heparin level = 0.34 units/hr on 1600 units/hr Goal heparin level = 0.3-0.7  Heparin level  is within desired therapeutic range. Continue the same rate. Continue daily AM heparin level and CBC.  Cardell Peach, PharmD

## 2012-08-05 NOTE — Progress Notes (Signed)
ANTICOAGULATION CONSULT NOTE - Follow-up  Pharmacy Consult for heparin Indication: chest pain/ACS  Patient Measurements: Height: 5\' 6"  (167.6 cm) Weight: 217 lb 0.3 oz (98.44 kg) (scale b) IBW/kg (Calculated) : 59.3  Heparin Dosing Weight: 82 kg  Vital Signs: Temp: 98.2 F (36.8 C) (08/08 0855) Temp src: Oral (08/08 0855) BP: 162/51 mmHg (08/08 1034) Pulse Rate: 74  (08/08 1034)  Labs:  Basename 08/05/12 1000 08/04/12 2315 08/04/12 1619  HGB 12.1 -- 12.2  HCT 36.2 -- 37.2  PLT 284 -- 289  APTT -- -- 35  LABPROT -- -- 13.9  INR -- -- 1.05  HEPARINUNFRC <0.10* <0.10* --  CREATININE -- -- 0.91  CKTOTAL -- -- --  CKMB -- -- --  TROPONINI -- -- --    Estimated Creatinine Clearance: 70 ml/min (by C-G formula based on Cr of 0.91).  Assessment: 67 YOF s/p cath with 3V CAD with ascending aortic aneurysm. Heparin gtt was started after cath yesterday. Initial heparin level was undetectable. A repeat heparin level this AM was also undetectable. Per RN, heparin has been infusing without any problems. CBC remains stable, no bleeding noted.   Goal of Therapy:  Heparin level 0.3-0.7 units/ml Monitor platelets by anticoagulation protocol: Yes   Plan:  1. Increase heparin gtt to 1600 units/hr (increase of ~3units/kg/hr) 2. Check an 8 hour heparin level 3. F/u surgery plan  Lysle Pearl, PharmD, BCPS Pager # [phone] 08/05/2012 11:03 AM

## 2012-08-05 NOTE — Progress Notes (Signed)
Nurse accompanied client to Respiratory for PFT's.  Client to go to CT after completion.  Thanks, [employer]

## 2012-08-05 NOTE — Progress Notes (Signed)
CSW provided patient with AD packet. Patient reported having no interest in filling out the paperwork but reported that she would look over it. CSW will f/u to see if patient has any further questions.   Sabino Niemann, MSW, [employer] [phone]

## 2012-08-05 NOTE — Plan of Care (Signed)
Problem: Phase I Progression Outcomes Goal: Aspirin unless contraindicated Outcome: Not Progressing Pt refused

## 2012-08-05 NOTE — Progress Notes (Signed)
Received order however noted pt needs OHS. Please specify ambulation pre-surg if warranted. Thx. Ethelda Chick CES, ACSM

## 2012-08-06 ENCOUNTER — Encounter (HOSPITAL_COMMUNITY): Payer: Medicare Other

## 2012-08-06 DIAGNOSIS — R519 Headache, unspecified: Secondary | ICD-10-CM | POA: Diagnosis not present

## 2012-08-06 DIAGNOSIS — R51 Headache: Secondary | ICD-10-CM | POA: Diagnosis not present

## 2012-08-06 LAB — CBC
Hemoglobin: 12.5 g/dL (ref 12.0–15.0)
MCHC: 34.2 g/dL (ref 30.0–36.0)
RBC: 4.22 MIL/uL (ref 3.87–5.11)

## 2012-08-06 LAB — HEPARIN LEVEL (UNFRACTIONATED): Heparin Unfractionated: 0.49 IU/mL (ref 0.30–0.70)

## 2012-08-06 MED ORDER — AMLODIPINE BESYLATE 10 MG PO TABS
10.0000 mg | ORAL_TABLET | Freq: Every day | ORAL | Status: DC
  Administered 2012-08-07 – 2012-08-09 (×3): 10 mg via ORAL
  Filled 2012-08-06 (×4): qty 1

## 2012-08-06 MED ORDER — ENOXAPARIN SODIUM 100 MG/ML ~~LOC~~ SOLN
100.0000 mg | Freq: Two times a day (BID) | SUBCUTANEOUS | Status: DC
  Administered 2012-08-06 – 2012-08-08 (×5): 100 mg via SUBCUTANEOUS
  Filled 2012-08-06 (×11): qty 1

## 2012-08-06 MED ORDER — LISINOPRIL 20 MG PO TABS
20.0000 mg | ORAL_TABLET | Freq: Every day | ORAL | Status: DC
  Administered 2012-08-06 – 2012-08-09 (×4): 20 mg via ORAL
  Filled 2012-08-06 (×6): qty 1

## 2012-08-06 MED ORDER — AMLODIPINE BESYLATE 5 MG PO TABS
5.0000 mg | ORAL_TABLET | Freq: Once | ORAL | Status: AC
  Administered 2012-08-06: 5 mg via ORAL
  Filled 2012-08-06 (×2): qty 1

## 2012-08-06 NOTE — Progress Notes (Signed)
Subjective:  NTG HA. No other neuro signs, no visual changes, weakness, speech issues.   Objective:  Vital Signs in the last 24 hours: Temp:  [97.2 F (36.2 C)-97.9 F (36.6 C)] 97.9 F (36.6 C) (08/09 1429) Pulse Rate:  [65-73] 69  (08/09 1429) Resp:  [18-19] 19  (08/09 1429) BP: (162-178)/(52-68) 172/68 mmHg (08/09 1429) SpO2:  [97 %-99 %] 97 % (08/09 1429) Weight:  [98.884 kg (218 lb)] 98.884 kg (218 lb) (08/09 1610)  Intake/Output from previous day: 08/08 0701 - 08/09 0700 In: 1309.1 [P.O.:760; I.V.:549.1] Out: 550 [Urine:550]   Physical Exam: General: Well developed, well nourished, in no acute distress. Head:  Normocephalic and atraumatic. Lungs: Clear to auscultation and percussion. Heart: Normal S1 and S2.  2/6 diastolic murmur RUSB,no rubs or gallops.  Abdomen: soft, non-tender, positive bowel sounds. Obese Extremities: No clubbing or cyanosis. No edema. Neurologic: Alert and oriented x 3. CN 2-12 intact. MAE x 4    Lab Results:  Basename 08/06/12 1100 08/05/12 1000  WBC 6.0 5.4  HGB 12.5 12.1  PLT 275 284    Basename 08/04/12 1619  NA 141  K 4.1  CL 107  CO2 27  GLUCOSE 143*  BUN 12  CREATININE 0.91   No results found for this basename: TROPONINI:2,CK,MB:2 in the last 72 hours Hepatic Function Panel  Basename 08/04/12 1619  PROT 6.7  ALBUMIN 3.4*  AST 15  ALT 13  ALKPHOS 58  BILITOT 0.3  BILIDIR --  IBILI --   No results found for this basename: CHOL in the last 72 hours No results found for this basename: PROTIME in the last 72 hours  Imaging: 47mm ascending Ao.  Personally viewed.   Telemetry: no adverse rhythms Personally viewed.    Assessment/Plan:  Active Problems:  Coronary artery disease  Aortic valve insufficiency  Dilated aortic root  Essential hypertension, benign  Pure hypercholesterolemia  Obesity, unspecified  -I will stop NTG. Likely cause of HA. If not resolved consider further eval. No evidence of neurologic  sequellae.  - Change heparin IV to Lovenox - Increase amlodipine to 10mg  (give 5 now) -Add lisinopril 20mg  for HTN, Creat 0.9 -Spoke to Dr. Tyrone Sage earlier tonight as well as Dr. Mayford Knife -Spoke to family about plan.    SKAINS, MARK 08/06/2012, 8:06 PM

## 2012-08-06 NOTE — Progress Notes (Signed)
Patient not interested in filling out AD paperwork at this time. Clinical Social Worker will sign off for now as social work intervention is no longer needed. Please consult [place] again if new need arises.   Sabino Niemann, MSW, [employer] [phone]

## 2012-08-06 NOTE — Progress Notes (Signed)
ANTICOAGULATION CONSULT NOTE - Follow Up Consult  Pharmacy Consult for lovenox Indication: CAD, awaiting surgery  Allergies  Allergen Reactions  . Asa (Aspirin) Nausea Only and Other (See Comments)    "hurts my stomach"    Patient Measurements: Height: 5\' 6"  (167.6 cm) Weight: 218 lb (98.884 kg) IBW/kg (Calculated) : 59.3   Vital Signs: Temp: 97.9 F (36.6 C) (08/09 1429) Temp src: Oral (08/09 1429) BP: 172/68 mmHg (08/09 1429) Pulse Rate: 69  (08/09 1429)  Labs:  Basename 08/06/12 1100 08/05/12 1859 08/05/12 1000 08/04/12 1619  HGB 12.5 -- 12.1 --  HCT 36.6 -- 36.2 37.2  PLT 275 -- 284 289  APTT -- -- -- 35  LABPROT -- -- -- 13.9  INR -- -- -- 1.05  HEPARINUNFRC 0.49 0.34 <0.10* --  CREATININE -- -- -- 0.91  CKTOTAL -- -- -- --  CKMB -- -- -- --  TROPONINI -- -- -- --    Estimated Creatinine Clearance: 70.1 ml/min (by C-G formula based on Cr of 0.91).   Medications:  Scheduled:     . amLODipine  5 mg Oral Daily  . aspirin EC  81 mg Oral Daily  . atorvastatin  40 mg Oral q1800  . enoxaparin (LOVENOX) injection  100 mg Subcutaneous Q12H  . metoprolol tartrate  25 mg Oral BID    Assessment: 66yo female with 3V CAD & ascending aortic aneurysm awaiting OHS next week to transition from IV heparin to lovenox tonight. CBC is stable no bleeding noted. Patient has good renal function.   Goal of Therapy:  Monitor platelets by anticoagulation protocol: Yes   Plan:  1. Lovenox 100mg  SQ Q12H - start 1 hour after heparin drip discontinued 2. CBC Q72H while on lovenox  Lysle Pearl, PharmD, BCPS Pager # [phone] 08/06/2012 8:08 PM

## 2012-08-06 NOTE — Progress Notes (Signed)
Patient complained of a headache and also refused to take tylenol per PRN order. Offered her PRN Ultram and explained how the medication worked and that it might make her a little sleepy. Patient decided to try Ultram and it was effective. Patient reassessment of pain was done and patient was asleep while grand son in the room. Lucila Maine stated that patient stated the the Ultram had worked very well. Will continue to monitor to end of shift and oncoming nurse has been notified.

## 2012-08-06 NOTE — Progress Notes (Signed)
Patient ID: Miranda Hutchinson, female   DOB: [birth year], 66 y.o.   MRN: [id number]                   [address]            [place] [address]          [phone]       Procedure(s) (LRB): CORONARY ARTERY BYPASS GRAFTING (CABG) (N/A) THORACIC ASCENDING ANEURYSM REPAIR (AAA) (N/A) AORTIC VALVE REPLACEMENT (AVR) (N/A)  LOS: 2 days   Subjective: No chest pain, co of headache  Objective: Vital signs in last 24 hours: Patient Vitals for the past 24 hrs:  BP Temp Temp src Pulse Resp SpO2 Weight  08/06/12 1429 172/68 mmHg 97.9 F (36.6 C) Oral 69  19  97 % -  08/06/12 1134 162/57 mmHg - - 70  - - -  08/06/12 0638 176/53 mmHg 97.2 F (36.2 C) Oral 65  18  99 % 218 lb (98.884 kg)  08/05/12 2159 178/52 mmHg 97.8 F (36.6 C) Oral 73  18  98 % -  08/05/12 1900 169/53 mmHg 97.8 F (36.6 C) Oral 68  18  99 % -    Filed Weights   08/04/12 1700 08/05/12 0500 08/06/12 0638  Weight: 217 lb (98.431 kg) 217 lb 0.3 oz (98.44 kg) 218 lb (98.884 kg)    Hemodynamic parameters for last 24 hours:    Intake/Output from previous day: 08/08 0701 - 08/09 0700 In: 1309.1 [P.O.:760; I.V.:549.1] Out: 550 [Urine:550] Intake/Output this shift: Total I/O In: 720 [P.O.:720] Out: 100 [Urine:100]  Scheduled Meds:   . amLODipine  5 mg Oral Daily  . aspirin EC  81 mg Oral Daily  . atorvastatin  40 mg Oral q1800  . metoprolol tartrate  25 mg Oral BID   Continuous Infusions:   . heparin 1,600 Units/hr (08/06/12 2130)  . nitroGLYCERIN 10 mcg/min (08/04/12 1601)   PRN Meds:.acetaminophen, nitroGLYCERIN, ondansetron (ZOFRAN) IV, traMADol  General appearance: alert and cooperative Neurologic: intact Heart: diastolic murmur: holodiastolic 3/6, blowing at 2nd left intercostal space Lungs: clear to auscultation bilaterally and normal percussion bilaterally Abdomen: soft, non-tender; bowel sounds normal; no masses,  no organomegaly  Lab Results: CBC: Basename 08/06/12 1100 08/05/12  1000  WBC 6.0 5.4  HGB 12.5 12.1  HCT 36.6 36.2  PLT 275 284   BMET:  Basename 08/04/12 1619  NA 141  K 4.1  CL 107  CO2 27  GLUCOSE 143*  BUN 12  CREATININE 0.91  CALCIUM 9.2    PT/INR:  Basename 08/04/12 1619  LABPROT 13.9  INR 1.05     Radiology Ct Coronary Morp W/cta Cor W/score W/ca W/cm &/or Wo/cm  08/05/2012  *RADIOLOGY REPORT*  Clinical Data: Dilated aortic root by echo and catheterization. Recent coronary Catheterization.  Evaluate aortic root size.  CT HEAR MORPH WITH CTA COR WITH SCORE WITH CA WITH CONTRAST AND OR WITHOUT CONTRAST  Contrast: OMNIPAQUE IOHEXOL 350 MG/ML SOLN  Comparison: None.  Findings: Dilated proximal and mid descending aorta extending from the sinotubular junction to the mid descending aorta.  AORTA AND PULMONARY MEASUREMENTS: Aortic root (21 - 40 mm):             23 mm  at the annulus             35 mm  at the sinuses of Valsalva             37 mm  at  the sinotubular junction Ascending aorta ( <  40 mm):  47 mm Aortic arch (< 40 mm): 29 mm Descending aorta ( <  40 mm):  25 mm Main pulmonary artery:  ( <  30 mm):  26 mm  EXTRACARDIAC FINDINGS: The great vessels are widely patent.  Calcifications in the dilated ascending aorta as well as in the aortic arch and descending thoracic aorta.  Calcifications within the coronary arteries. Recommend correlation with recent catheterization results.  There is distortion of the aortic root and sinotubular region due to the dilated proximal descending aorta.  The left main coronary artery arises from the left coronary sinus.  The right coronary artery arises from the anterior sinus.  Mild cardiomegaly.  Minimal dependent atelectasis in the visualized lungs.  Otherwise the visualized lungs are clear.  No pleural or pericardial effusion.  No adenopathy in the visualized mediastinum or hila.  IMPRESSION:  1.  Ascending aortic aneurysm beginning at the aortic root and continuing into the mid ascending aorta.  Aortic root  structures are distorted due to the dilated ascending aorta.  Great vessels normal caliber and patent.  Coronary artery calcifications.  Original Report Authenticated By: Cyndie Chime, M.D.     Assessment/Plan:  CORONARY ARTERY BYPASS GRAFTING (CABG) (N/A) THORACIC ASCENDING ANEURYSM REPAIR (AAA) (N/A) AORTIC VALVE REPLACEMENT (AVR) (N/A) poss ECHO from  [facility] office obtained and reviewed If requires valve replacement patient prefers tissue valve Plan to proceed with surgery on Tuesday Patient agreeable. She now complains of headache waiting for Dr Mayford Knife to discuss changing NTG drip    Delight Ovens MD 08/06/2012 6:50 PM

## 2012-08-06 NOTE — Progress Notes (Signed)
ANTICOAGULATION CONSULT NOTE - Follow Up Consult  Pharmacy Consult for Heparin Indication: CAD, awaiting surgery  Allergies  Allergen Reactions  . Asa (Aspirin) Nausea Only and Other (See Comments)    "hurts my stomach"    Patient Measurements: Height: 5\' 6"  (167.6 cm) Weight: 218 lb (98.884 kg) IBW/kg (Calculated) : 59.3  Heparin Dosing Weight:   Vital Signs: Temp: 97.2 F (36.2 C) (08/09 0638) Temp src: Oral (08/09 0638) BP: 162/57 mmHg (08/09 1134) Pulse Rate: 70  (08/09 1134)  Labs:  Basename 08/06/12 1100 08/05/12 1859 08/05/12 1000 08/04/12 1619  HGB 12.5 -- 12.1 --  HCT 36.6 -- 36.2 37.2  PLT 275 -- 284 289  APTT -- -- -- 35  LABPROT -- -- -- 13.9  INR -- -- -- 1.05  HEPARINUNFRC 0.49 0.34 <0.10* --  CREATININE -- -- -- 0.91  CKTOTAL -- -- -- --  CKMB -- -- -- --  TROPONINI -- -- -- --    Estimated Creatinine Clearance: 70.1 ml/min (by C-G formula based on Cr of 0.91).   Medications:  Scheduled:    . amLODipine  5 mg Oral Daily  . aspirin EC  81 mg Oral Daily  . atorvastatin  40 mg Oral q1800  . metoprolol tartrate  25 mg Oral BID    Assessment: 66yo female with 3V CAD & ascending aortic aneurysm, for surgery next week.  Heparin level is therapeutic on 1600 units/hr, with HL = 0.49, and CBC is stable.  No bleeding problems noted.  FOB is negative.  Goal of Therapy:  Heparin level 0.3-0.7 units/ml Monitor platelets by anticoagulation protocol: Yes   Plan:  1.  Continue current heparin rate 2.  F/U in AM  Marisue Humble, PharmD Clinical Pharmacist [facility]

## 2012-08-07 LAB — BASIC METABOLIC PANEL
BUN: 12 mg/dL (ref 6–23)
CO2: 26 mEq/L (ref 19–32)
Chloride: 107 mEq/L (ref 96–112)
Creatinine, Ser: 0.84 mg/dL (ref 0.50–1.10)
GFR calc Af Amer: 81 mL/min — ABNORMAL LOW (ref >90)
Glucose, Bld: 97 mg/dL (ref 70–99)
Potassium: 4.1 mEq/L (ref 3.5–5.1)

## 2012-08-07 LAB — CBC
HCT: 35.1 % — ABNORMAL LOW (ref 36.0–46.0)
Hemoglobin: 11.7 g/dL — ABNORMAL LOW (ref 12.0–15.0)
MCV: 87.8 fL (ref 78.0–100.0)
RBC: 4 MIL/uL (ref 3.87–5.11)
WBC: 5.6 10*3/uL (ref 4.0–10.5)

## 2012-08-07 LAB — OCCULT BLOOD X 1 CARD TO LAB, STOOL: Fecal Occult Bld: NEGATIVE

## 2012-08-07 NOTE — Progress Notes (Signed)
[id number] Cardiac Rehab Noted that pt still has order for bedrest. Please advice if you would like pt to ambulate.

## 2012-08-07 NOTE — Progress Notes (Signed)
CARDIAC REHAB PHASE I   PRE:  Rate/Rhythm: 67 SR  BP:  Supine:   Sitting: 146/46  Standing:    SaO2: 100 RA  MODE:  Ambulation: 460 ft   POST:  Rate/Rhythem: 70  BP:  Supine:   Sitting: 155/64  Standing:    SaO2: 99 RA 1505-1535 On arrival pt up in bathroom, she denies having any cp or SOB today. Assisted X 1 to ambulate. Gait steady pt denies any symptoms with walking. VS stable. Pt to recliner after walk with call light in reach.  Miranda Hutchinson

## 2012-08-07 NOTE — Progress Notes (Signed)
Subjective:  Doing much better now that she is off NTG. No SOB, no CP.  Objective:  In the last 24 hours: Temp:  [97.9 F (36.6 C)-98.9 F (37.2 C)] 98.1 F (36.7 C) (08/10 0459) Pulse Rate:  [62-70] 62  (08/10 0459) Resp:  [18-19] 18  (08/10 0459) BP: (109-172)/(45-68) 109/45 mmHg (08/10 0459) SpO2:  [97 %-100 %] 97 % (08/10 0459) Weight:  [96.253 kg (212 lb 3.2 oz)] 96.253 kg (212 lb 3.2 oz) (08/10 0459)  Intake/Output from previous day: 08/09 0701 - 08/10 0700 In: 1070 [P.O.:1070] Out: 600 [Urine:600]   Physical Exam: General: Well developed, well nourished, in no acute distress. Head:  Normocephalic and atraumatic. Lungs: Clear to auscultation and percussion. Heart: Normal S1 and S2.  2/6 early systolic, 2/6 mild diastolic murmur, rubs or gallops.  Abdomen: soft, non-tender, positive bowel sounds. Extremities: No clubbing or cyanosis. No edema. Neurologic: Alert and oriented x 3.    Lab Results:  Basename 08/07/12 0605 08/06/12 1100  WBC 5.6 6.0  HGB 11.7* 12.5  PLT 271 275    Basename 08/07/12 0605 08/04/12 1619  NA 143 141  K 4.1 4.1  CL 107 107  CO2 26 27  GLUCOSE 97 143*  BUN 12 12  CREATININE 0.84 0.91   No results found for this basename: TROPONINI:2,CK,MB:2 in the last 72 hours Hepatic Function Panel  Basename 08/04/12 1619  PROT 6.7  ALBUMIN 3.4*  AST 15  ALT 13  ALKPHOS 58  BILITOT 0.3  BILIDIR --  IBILI --  Telemetry: NSR Personally viewed.     Assessment/Plan:  Active Problems:  Coronary artery disease  Aortic valve insufficiency  Dilated aortic root  Essential hypertension, benign  Pure hypercholesterolemia  Obesity, unspecified  Headache   1. CAD - Severe. Await CABG. She may get out of bed, mild ambulation but nothing significant. She has had exertional angina.  2. Dilated aortic root - 47mm. Root repair pending 3. AI - moderate. TEE intraoperative, decision will be made about valve at that time.  4. HTN - stable 5. HA-  resolved.    SKAINS, MARK 08/07/2012, 10:04 AM

## 2012-08-08 LAB — CBC
HCT: 37.8 % (ref 36.0–46.0)
Hemoglobin: 12.5 g/dL (ref 12.0–15.0)
MCH: 29.1 pg (ref 26.0–34.0)
MCHC: 33.1 g/dL (ref 30.0–36.0)
MCV: 88.1 fL (ref 78.0–100.0)

## 2012-08-08 NOTE — Progress Notes (Signed)
Subjective:  Overall looks well. Sitting in chair. She had some shortness of breath with minimal to moderate activity. No angina. She is off nitroglycerin. No longer has headache. No bleeding with Lovenox.  Objective:  Vital Signs in the last 24 hours: Temp:  [97.9 F (36.6 C)-98.2 F (36.8 C)] 98.2 F (36.8 C) (08/11 0515) Pulse Rate:  [58-65] 58  (08/11 0515) Resp:  [18-20] 18  (08/11 0515) BP: (111-137)/(45-55) 130/55 mmHg (08/11 0515) SpO2:  [96 %-100 %] 96 % (08/11 0515) Weight:  [98.113 kg (216 lb 4.8 oz)] 98.113 kg (216 lb 4.8 oz) (08/11 0515)  Intake/Output from previous day: 08/10 0701 - 08/11 0700 In: 960 [P.O.:960] Out: 1651 [Urine:1650; Stool:1]   Physical Exam: General: Well developed, well nourished, in no acute distress. Head:  Normocephalic and atraumatic. Lungs: Clear to auscultation and percussion. Heart: Normal S1 and S2.  Soft diastolic murmur, brief systolic murmur.  Abdomen: soft, non-tender, positive bowel sounds. Obese Extremities: No clubbing or cyanosis. No edema. Neurologic: Alert and oriented x 3.    Lab Results:  Basename 08/08/12 0550 08/07/12 0605  WBC 5.8 5.6  HGB 12.5 11.7*  PLT 271 271    Basename 08/07/12 0605  NA 143  K 4.1  CL 107  CO2 26  GLUCOSE 97  BUN 12  CREATININE 0.84   No results found for this basename: TROPONINI:2,CK,MB:2 in the last 72 hours Hepatic Function Panel   Telemetry: Sinus rhythm Personally viewed.   Cardiac Studies:  Catheterization with triple vessel disease, aortic root 47 mm on CT scan. Moderate aortic insufficiency.  Assessment/Plan:  Active Problems:  Coronary artery disease  Aortic valve insufficiency  Dilated aortic root  Essential hypertension, benign  Pure hypercholesterolemia  Obesity, unspecified  Headache   1. Severe coronary artery disease-await bypass surgery on Tuesday. Doing well. Continue with full dose anticoagulation.  2. Aortic insufficiency-at least moderate on recent  echocardiogram reviewed by Dr. Tyrone Sage. Transesophageal echocardiogram will take place in the operating room to make full decision on bioprosthetic aortic valve.  3. Aortic root dilatation-47 mm ascending root. Root graft pending.  4. Obesity-encourage weight loss  5. Hypertension-improved control with up titration of medications. Medications reviewed.  SKAINS, MARK 08/08/2012, 9:40 AM

## 2012-08-09 ENCOUNTER — Encounter (HOSPITAL_COMMUNITY): Payer: Self-pay | Admitting: Certified Registered Nurse Anesthetist

## 2012-08-09 DIAGNOSIS — I712 Thoracic aortic aneurysm, without rupture: Secondary | ICD-10-CM

## 2012-08-09 DIAGNOSIS — I2510 Atherosclerotic heart disease of native coronary artery without angina pectoris: Secondary | ICD-10-CM

## 2012-08-09 LAB — CBC
MCH: 28.8 pg (ref 26.0–34.0)
MCHC: 33.1 g/dL (ref 30.0–36.0)
MCV: 87.2 fL (ref 78.0–100.0)
Platelets: 292 10*3/uL (ref 150–400)
RBC: 4.44 MIL/uL (ref 3.87–5.11)

## 2012-08-09 MED ORDER — POTASSIUM CHLORIDE 2 MEQ/ML IV SOLN
80.0000 meq | INTRAVENOUS | Status: DC
  Filled 2012-08-09: qty 40

## 2012-08-09 MED ORDER — TRANEXAMIC ACID 100 MG/ML IV SOLN
1.5000 mg/kg/h | INTRAVENOUS | Status: AC
  Administered 2012-08-10: 1.5 mg/kg/h via INTRAVENOUS
  Filled 2012-08-09: qty 25

## 2012-08-09 MED ORDER — VANCOMYCIN HCL 1000 MG IV SOLR
1500.0000 mg | INTRAVENOUS | Status: AC
  Administered 2012-08-10: 1500 mg via INTRAVENOUS
  Filled 2012-08-09: qty 1500

## 2012-08-09 MED ORDER — MAGNESIUM SULFATE 50 % IJ SOLN
40.0000 meq | INTRAMUSCULAR | Status: DC
  Filled 2012-08-09: qty 10

## 2012-08-09 MED ORDER — TRANEXAMIC ACID (OHS) PUMP PRIME SOLUTION
2.0000 mg/kg | INTRAVENOUS | Status: DC
  Filled 2012-08-09: qty 1.94

## 2012-08-09 MED ORDER — TEMAZEPAM 15 MG PO CAPS: 15.0000 mg | ORAL_CAPSULE | Freq: Once | ORAL | Status: AC | PRN

## 2012-08-09 MED ORDER — DEXTROSE 5 % IV SOLN
1.5000 g | INTRAVENOUS | Status: AC
  Administered 2012-08-10: .75 g via INTRAVENOUS
  Administered 2012-08-10: 1.5 g via INTRAVENOUS
  Filled 2012-08-09: qty 1.5

## 2012-08-09 MED ORDER — CHLORHEXIDINE GLUCONATE 4 % EX LIQD
60.0000 mL | Freq: Once | CUTANEOUS | Status: AC
  Administered 2012-08-10: 4 via TOPICAL
  Filled 2012-08-09 (×2): qty 60

## 2012-08-09 MED ORDER — DEXMEDETOMIDINE HCL IN NACL 400 MCG/100ML IV SOLN
0.1000 ug/kg/h | INTRAVENOUS | Status: AC
  Administered 2012-08-10: 29 ug/h via INTRAVENOUS
  Filled 2012-08-09: qty 100

## 2012-08-09 MED ORDER — DEXTROSE 5 % IV SOLN
750.0000 mg | INTRAVENOUS | Status: DC
  Filled 2012-08-09: qty 750

## 2012-08-09 MED ORDER — DOPAMINE-DEXTROSE 3.2-5 MG/ML-% IV SOLN
2.0000 ug/kg/min | INTRAVENOUS | Status: DC
  Administered 2012-08-10: 3 ug/kg/min via INTRAVENOUS
  Filled 2012-08-09: qty 250

## 2012-08-09 MED ORDER — BISACODYL 5 MG PO TBEC
5.0000 mg | DELAYED_RELEASE_TABLET | Freq: Once | ORAL | Status: AC
  Administered 2012-08-09: 5 mg via ORAL
  Filled 2012-08-09: qty 1

## 2012-08-09 MED ORDER — METOPROLOL TARTRATE 12.5 MG HALF TABLET
12.5000 mg | ORAL_TABLET | Freq: Once | ORAL | Status: AC
  Administered 2012-08-10: 12.5 mg via ORAL
  Filled 2012-08-09: qty 1

## 2012-08-09 MED ORDER — SODIUM CHLORIDE 0.9 % IV SOLN
INTRAVENOUS | Status: AC
  Administered 2012-08-10: 1.3 [IU]/h via INTRAVENOUS
  Filled 2012-08-09: qty 1

## 2012-08-09 MED ORDER — NITROGLYCERIN IN D5W 200-5 MCG/ML-% IV SOLN
2.0000 ug/min | INTRAVENOUS | Status: DC
  Filled 2012-08-09: qty 250

## 2012-08-09 MED ORDER — EPINEPHRINE HCL 1 MG/ML IJ SOLN
0.5000 ug/min | INTRAVENOUS | Status: DC
  Filled 2012-08-09: qty 4

## 2012-08-09 MED ORDER — SODIUM BICARBONATE 8.4 % IV SOLN
INTRAVENOUS | Status: AC
  Administered 2012-08-10: 10:00:00
  Filled 2012-08-09 (×2): qty 2.5

## 2012-08-09 MED ORDER — PHENYLEPHRINE HCL 10 MG/ML IJ SOLN
30.0000 ug/min | INTRAVENOUS | Status: AC
  Administered 2012-08-10: 10 ug/min via INTRAVENOUS
  Filled 2012-08-09: qty 2

## 2012-08-09 MED ORDER — TRANEXAMIC ACID (OHS) BOLUS VIA INFUSION
15.0000 mg/kg | INTRAVENOUS | Status: AC
  Administered 2012-08-10: 1455 mg via INTRAVENOUS
  Filled 2012-08-09: qty 1455

## 2012-08-09 NOTE — Progress Notes (Signed)
Subjective:  Feels good. No complaints of CP, SOB. Ambulating well.   Objective:  Vital Signs in the last 24 hours: Temp:  [97.6 F (36.4 C)-97.8 F (36.6 C)] 97.6 F (36.4 C) (08/12 0505) Pulse Rate:  [61-76] 76  (08/12 0505) Resp:  [20] 20  (08/12 0505) BP: (102-149)/(47-62) 102/49 mmHg (08/12 0505) SpO2:  [95 %-96 %] 96 % (08/12 0505) Weight:  [97 kg (213 lb 13.5 oz)] 97 kg (213 lb 13.5 oz) (08/12 0650)  Intake/Output from previous day: 08/11 0701 - 08/12 0700 In: 600 [P.O.:600] Out: 2275 [Urine:2275]   Physical Exam: General: Well developed, well nourished, in no acute distress.  Head: Normocephalic and atraumatic.  Lungs: Clear to auscultation and percussion.  Heart: Normal S1 and S2. Soft diastolic murmur, brief systolic murmur.  Abdomen: soft, non-tender, positive bowel sounds. Obese  Extremities: No clubbing or cyanosis. No edema.  Neurologic: Alert and oriented x 3.     Lab Results:  Basename 08/09/12 0550 08/08/12 0550  WBC 6.3 5.8  HGB 12.8 12.5  PLT 292 271    Basename 08/07/12 0605  NA 143  K 4.1  CL 107  CO2 26  GLUCOSE 97  BUN 12  CREATININE 0.84  Telemetry: No adverse rhythms Personally viewed.    Assessment/Plan:  Active Problems:  Coronary artery disease  Aortic valve insufficiency  Dilated aortic root  Essential hypertension, benign  Pure hypercholesterolemia  Obesity, unspecified  Headache   1. Severe coronary artery disease-await bypass surgery on Tuesday. Doing well. Continue with full dose anticoagulation.  2. Aortic insufficiency-at least moderate on recent echocardiogram reviewed by Dr. Tyrone Sage. Transesophageal echocardiogram will take place in the operating room to make full decision on bioprosthetic aortic valve.  3. Aortic root dilatation-47 mm ascending root. Root graft pending.  4. Obesity-encourage weight loss  5. Hypertension-improved control with up titration of medications. Medications reviewed.   Miranda Hutchinson,  Miranda Hutchinson 08/09/2012, 10:15 AM

## 2012-08-09 NOTE — Progress Notes (Signed)
ANTICOAGULATION CONSULT NOTE - Follow Up Consult  Pharmacy Consult for Lovenox Indication: CAD, awaiting OHS  Allergies  Allergen Reactions  . Asa (Aspirin) Nausea Only and Other (See Comments)    "hurts my stomach"   Vital Signs: Temp: 97.6 F (36.4 C) (08/12 0505) Temp src: Oral (08/12 0505) BP: 102/49 mmHg (08/12 0505) Pulse Rate: 76  (08/12 0505)  Labs:  Basename 08/09/12 0550 08/08/12 0550 08/07/12 0605 08/06/12 1100  HGB 12.8 12.5 -- --  HCT 38.7 37.8 35.1* --  PLT 292 271 271 --  APTT -- -- -- --  LABPROT -- -- -- --  INR -- -- -- --  HEPARINUNFRC -- -- -- 0.49  CREATININE -- -- 0.84 --  CKTOTAL -- -- -- --  CKMB -- -- -- --  TROPONINI -- -- -- --    Estimated Creatinine Clearance: 75.3 ml/min (by C-G formula based on Cr of 0.84).  Medications:  Lovenox 100mg  q12  Assessment: 66yof continues on full dose lovenox awaiting CABG/Ao root replacement/poss AVR tomorrow. Last sCr on 8/10 = 0.84 (stable). Weight=97kg. CBC stable. No bleeding noted.  Goal of Therapy:  Anti-Xa level 0.6-1.2 units/ml 4hrs after LMWH dose given Monitor platelets by anticoagulation protocol: Yes   Plan:  1) Continue lovenox 100mg  q12 2) Follow up after OHS 8/13  Fredrik Rigger 08/09/2012,10:16 AM

## 2012-08-09 NOTE — Progress Notes (Signed)
Patient ID: Miranda Hutchinson, female   DOB: [birth year], 66 y.o.   MRN: [id number]                    [address]            [place] [address]          [phone]       Procedure(s) (LRB): CORONARY ARTERY BYPASS GRAFTING (CABG) (N/A) THORACIC ASCENDING ANEURYSM REPAIR (AAA) (N/A) AORTIC VALVE REPLACEMENT (AVR) (N/A)  Subjective: Stable no chest pain, question about surgery discussed  Objective: Vital signs in last 24 hours: Patient Vitals for the past 24 hrs:  BP Temp Temp src Pulse Resp SpO2 Weight  08/09/12 1330 107/52 mmHg 98.3 F (36.8 C) Oral 65  18  94 % -  08/09/12 1203 136/48 mmHg 98.1 F (36.7 C) Oral 80  20  97 % -  08/09/12 0650 - - - - - - 213 lb 13.5 oz (97 kg)  08/09/12 0505 102/49 mmHg 97.6 F (36.4 C) Oral 76  20  96 % -  08/08/12 2100 142/56 mmHg 97.6 F (36.4 C) Oral 73  20  95 % -   Current Weight  08/09/12 213 lb 13.5 oz (97 kg)     Intake/Output from previous day: 08/11 0701 - 08/12 0700 In: 600 [P.O.:600] Out: 2275 [Urine:2275]    PHYSICAL EXAM: BP 107/52  Pulse 65  Temp 98.3 F (36.8 C) (Oral)  Resp 18  Ht 5\' 6"  (1.676 m)  Wt 213 lb 13.5 oz (97 kg)  BMI 34.52 kg/m2  SpO2 94%  Heart: m of ai Lungs: clear  Extremities: mo edema   Lab Results: CBC: Basename 08/09/12 0550 08/08/12 0550  WBC 6.3 5.8  HGB 12.8 12.5  HCT 38.7 37.8  PLT 292 271   BMET:  Basename 08/07/12 0605  NA 143  K 4.1  CL 107  CO2 26  GLUCOSE 97  BUN 12  CREATININE 0.84  CALCIUM 9.4    PT/INR: No results found for this basename: LABPROT,INR in the last 72 hours    Assessment/Plan: PLAN TOMORROW: Procedure(s) (LRB): CORONARY ARTERY BYPASS GRAFTING (CABG) (N/A) THORACIC ASCENDING ANEURYSM REPAIR (AAA) (N/A) AORTIC VALVE REPLACEMENT (AVR) (N/A)  The goals risks and alternatives of the planned surgical procedure cabg, REPLACE ASCENDING AORTA AND POSS AORTIC VALVE,   have been discussed with the patient in detail. The risks of  the procedure including death, infection, stroke, myocardial infarction, bleeding, blood transfusion have all been discussed specifically. she preferres NOT TO HAVE MECHANICAL VALVE   I have quoted Charmian Muff a 5% of perioperative mortality and a complication rate as high as 25 %. The patient's questions have been answered.CLOVER FEEHAN is willing  to proceed with the planned procedure.   Navjot Loera B 08/09/2012

## 2012-08-09 NOTE — Progress Notes (Signed)
Page MD about Lov(vte) b4 surg. MD stated to hold 8-12 does.

## 2012-08-09 NOTE — Progress Notes (Signed)
CARDIAC REHAB PHASE I   PRE:  Rate/Rhythm: 75 SR  BP:  Supine:   Sitting: 126/44  Standing:    SaO2: 100 RA  MODE:  Ambulation: 460 ft   POST:  Rate/Rhythem: 80  BP:  Supine:   Sitting: 127/49  Standing:    SaO2: 99 RA 0830-0900 On arrival pt denies any cp or SOB. Pt walked 460 feet without c/o. VS stable. Pt back to side of bed after walk with call light in reach. Discussed post op routine with pt. Encouraged her to watch going for OHS video today.She has going for OHS booklet and has been using IS.  Beatrix Fetters

## 2012-08-10 ENCOUNTER — Encounter (HOSPITAL_COMMUNITY): Payer: Self-pay | Admitting: Certified Registered Nurse Anesthetist

## 2012-08-10 ENCOUNTER — Encounter (HOSPITAL_COMMUNITY)
Admission: AD | Disposition: A | Discharge status: Home / Self Care | Payer: Self-pay | Source: Ambulatory Visit | Attending: Cardiothoracic Surgery

## 2012-08-10 ENCOUNTER — Inpatient Hospital Stay (HOSPITAL_COMMUNITY): Payer: Medicare Other | Admitting: Certified Registered Nurse Anesthetist

## 2012-08-10 ENCOUNTER — Inpatient Hospital Stay (HOSPITAL_COMMUNITY): Payer: Medicare Other

## 2012-08-10 DIAGNOSIS — I2510 Atherosclerotic heart disease of native coronary artery without angina pectoris: Secondary | ICD-10-CM

## 2012-08-10 DIAGNOSIS — I712 Thoracic aortic aneurysm, without rupture: Secondary | ICD-10-CM

## 2012-08-10 HISTORY — PX: THORACIC AORTIC ANEURYSM REPAIR: SHX799

## 2012-08-10 HISTORY — PX: AORTIC VALVE REPLACEMENT: SHX41

## 2012-08-10 HISTORY — PX: CORONARY ARTERY BYPASS GRAFT: SHX141

## 2012-08-10 LAB — URINALYSIS, ROUTINE W REFLEX MICROSCOPIC
Bilirubin Urine: NEGATIVE
Ketones, ur: NEGATIVE mg/dL
Nitrite: NEGATIVE
Protein, ur: NEGATIVE mg/dL
Urobilinogen, UA: 0.2 mg/dL (ref 0.0–1.0)

## 2012-08-10 LAB — COMPREHENSIVE METABOLIC PANEL
AST: 51 U/L — ABNORMAL HIGH (ref 0–37)
Albumin: 3.7 g/dL (ref 3.5–5.2)
BUN: 24 mg/dL — ABNORMAL HIGH (ref 6–23)
Calcium: 10 mg/dL (ref 8.4–10.5)
Creatinine, Ser: 1.05 mg/dL (ref 0.50–1.10)
Total Bilirubin: 0.4 mg/dL (ref 0.3–1.2)
Total Protein: 7.6 g/dL (ref 6.0–8.3)

## 2012-08-10 LAB — POCT I-STAT 3, ART BLOOD GAS (G3+)
Acid-Base Excess: 1 mmol/L (ref 0.0–2.0)
Acid-base deficit: 5 mmol/L — ABNORMAL HIGH (ref 0.0–2.0)
Acid-base deficit: 4 mmol/L — ABNORMAL HIGH (ref 0.0–2.0)
Acid-base deficit: 2 mmol/L (ref 0.0–2.0)
Bicarbonate: 20.8 mEq/L (ref 20.0–24.0)
O2 Saturation: 96 %
O2 Saturation: 94 %
Patient temperature: 36.7
Patient temperature: 36.6
TCO2: 22 mmol/L (ref 0–100)
TCO2: 22 mmol/L (ref 0–100)
pCO2 arterial: 40.7 mmHg (ref 35.0–45.0)
pCO2 arterial: 38.2 mmHg (ref 35.0–45.0)
pH, Arterial: 7.456 — ABNORMAL HIGH (ref 7.350–7.450)
pH, Arterial: 7.322 — ABNORMAL LOW (ref 7.350–7.450)
pH, Arterial: 7.34 — ABNORMAL LOW (ref 7.350–7.450)
pO2, Arterial: 58 mmHg — ABNORMAL LOW (ref 80.0–100.0)

## 2012-08-10 LAB — SURGICAL PCR SCREEN
MRSA, PCR: NEGATIVE
Staphylococcus aureus: NEGATIVE

## 2012-08-10 LAB — BLOOD GAS, ARTERIAL
Acid-base deficit: 2.4 mmol/L — ABNORMAL HIGH (ref 0.0–2.0)
Drawn by: 35849
FIO2: 0.21 %
pCO2 arterial: 37.3 mmHg (ref 35.0–45.0)
pH, Arterial: 7.385 (ref 7.350–7.450)

## 2012-08-10 LAB — CBC
HCT: 39.9 % (ref 36.0–46.0)
HCT: 34.8 % — ABNORMAL LOW (ref 36.0–46.0)
HCT: 27.3 % — ABNORMAL LOW (ref 36.0–46.0)
Hemoglobin: 9.6 g/dL — ABNORMAL LOW (ref 12.0–15.0)
MCH: 30.3 pg (ref 26.0–34.0)
MCHC: 33.6 g/dL (ref 30.0–36.0)
MCHC: 34.8 g/dL (ref 30.0–36.0)
MCHC: 35.2 g/dL (ref 30.0–36.0)
MCV: 86.1 fL (ref 78.0–100.0)
Platelets: 144 10*3/uL — ABNORMAL LOW (ref 150–400)
Platelets: 121 10*3/uL — ABNORMAL LOW (ref 150–400)
RBC: 3.17 MIL/uL — ABNORMAL LOW (ref 3.87–5.11)
RDW: 13.5 % (ref 11.5–15.5)
RDW: 13.3 % (ref 11.5–15.5)
RDW: 13.2 % (ref 11.5–15.5)
WBC: 8 10*3/uL (ref 4.0–10.5)
WBC: 23.3 10*3/uL — ABNORMAL HIGH (ref 4.0–10.5)
WBC: 13.2 10*3/uL — ABNORMAL HIGH (ref 4.0–10.5)

## 2012-08-10 LAB — CREATININE, SERUM
Creatinine, Ser: 0.75 mg/dL (ref 0.50–1.10)
GFR calc Af Amer: >90 mL/min (ref >90)
GFR calc non Af Amer: 85 mL/min — ABNORMAL LOW (ref >90)

## 2012-08-10 LAB — POCT I-STAT 4, (NA,K, GLUC, HGB,HCT)
Glucose, Bld: 92 mg/dL (ref 70–99)
Glucose, Bld: 150 mg/dL — ABNORMAL HIGH (ref 70–99)
Glucose, Bld: 155 mg/dL — ABNORMAL HIGH (ref 70–99)
Glucose, Bld: 129 mg/dL — ABNORMAL HIGH (ref 70–99)
Glucose, Bld: 140 mg/dL — ABNORMAL HIGH (ref 70–99)
Glucose, Bld: 127 mg/dL — ABNORMAL HIGH (ref 70–99)
HCT: 27 % — ABNORMAL LOW (ref 36.0–46.0)
HCT: 25 % — ABNORMAL LOW (ref 36.0–46.0)
HCT: 35 % — ABNORMAL LOW (ref 36.0–46.0)
Hemoglobin: 12.2 g/dL (ref 12.0–15.0)
Hemoglobin: 8.5 g/dL — ABNORMAL LOW (ref 12.0–15.0)
Hemoglobin: 9.2 g/dL — ABNORMAL LOW (ref 12.0–15.0)
Hemoglobin: 8.8 g/dL — ABNORMAL LOW (ref 12.0–15.0)
Hemoglobin: 11.9 g/dL — ABNORMAL LOW (ref 12.0–15.0)
Potassium: 4.6 mEq/L (ref 3.5–5.1)
Potassium: 4.2 mEq/L (ref 3.5–5.1)
Potassium: 5.5 mEq/L — ABNORMAL HIGH (ref 3.5–5.1)
Potassium: 4.4 mEq/L (ref 3.5–5.1)
Sodium: 138 mEq/L (ref 135–145)
Sodium: 134 mEq/L — ABNORMAL LOW (ref 135–145)
Sodium: 134 mEq/L — ABNORMAL LOW (ref 135–145)
Sodium: 136 mEq/L (ref 135–145)
Sodium: 141 mEq/L (ref 135–145)

## 2012-08-10 LAB — URINE MICROSCOPIC-ADD ON

## 2012-08-10 LAB — HEMOGLOBIN A1C
Hgb A1c MFr Bld: 6.1 % — ABNORMAL HIGH (ref <5.7)
Mean Plasma Glucose: 128 mg/dL — ABNORMAL HIGH (ref <117)

## 2012-08-10 LAB — HEMOGLOBIN AND HEMATOCRIT, BLOOD
HCT: 24.7 % — ABNORMAL LOW (ref 36.0–46.0)
Hemoglobin: 8.5 g/dL — ABNORMAL LOW (ref 12.0–15.0)

## 2012-08-10 LAB — PLATELET COUNT: Platelets: 163 10*3/uL (ref 150–400)

## 2012-08-10 LAB — PROTIME-INR
INR: 1.8 — ABNORMAL HIGH (ref 0.00–1.49)
Prothrombin Time: 21.2 seconds — ABNORMAL HIGH (ref 11.6–15.2)

## 2012-08-10 LAB — MAGNESIUM: Magnesium: 3.3 mg/dL — ABNORMAL HIGH (ref 1.5–2.5)

## 2012-08-10 SURGERY — CORONARY ARTERY BYPASS GRAFTING (CABG)
Anesthesia: General | Site: Chest | Wound class: Clean

## 2012-08-10 MED ORDER — SODIUM CHLORIDE 0.9 % IV SOLN
INTRAVENOUS | Status: DC
  Administered 2012-08-10: 20 mL via INTRAVENOUS

## 2012-08-10 MED ORDER — BISACODYL 5 MG PO TBEC
10.0000 mg | DELAYED_RELEASE_TABLET | Freq: Every day | ORAL | Status: DC
  Administered 2012-08-11 – 2012-08-14 (×4): 10 mg via ORAL
  Filled 2012-08-10 (×4): qty 2

## 2012-08-10 MED ORDER — LACTATED RINGERS IV SOLN
INTRAVENOUS | Status: DC
  Administered 2012-08-10: 20 mL via INTRAVENOUS

## 2012-08-10 MED ORDER — ACETAMINOPHEN 500 MG PO TABS
1000.0000 mg | ORAL_TABLET | Freq: Four times a day (QID) | ORAL | Status: DC
  Administered 2012-08-11 – 2012-08-14 (×12): 1000 mg via ORAL
  Filled 2012-08-10 (×16): qty 2

## 2012-08-10 MED ORDER — DEXMEDETOMIDINE HCL IN NACL 200 MCG/50ML IV SOLN
0.1000 ug/kg/h | INTRAVENOUS | Status: DC
  Administered 2012-08-10: 0.5 ug/kg/h via INTRAVENOUS
  Filled 2012-08-10 (×2): qty 50

## 2012-08-10 MED ORDER — PROTAMINE SULFATE 10 MG/ML IV SOLN
INTRAVENOUS | Status: DC | PRN
  Administered 2012-08-10 (×3): 100 mg via INTRAVENOUS
  Administered 2012-08-10: 10 mg via INTRAVENOUS
  Administered 2012-08-10: 40 mg via INTRAVENOUS

## 2012-08-10 MED ORDER — HEPARIN SODIUM (PORCINE) 1000 UNIT/ML IJ SOLN
INTRAMUSCULAR | Status: AC
  Filled 2012-08-10: qty 1

## 2012-08-10 MED ORDER — METOPROLOL TARTRATE 12.5 MG HALF TABLET
12.5000 mg | ORAL_TABLET | Freq: Two times a day (BID) | ORAL | Status: DC
  Filled 2012-08-10 (×3): qty 1

## 2012-08-10 MED ORDER — BISACODYL 10 MG RE SUPP: 10.0000 mg | Freq: Every day | RECTAL | Status: DC

## 2012-08-10 MED ORDER — ACETAMINOPHEN 650 MG RE SUPP
650.0000 mg | RECTAL | Status: AC
  Administered 2012-08-10: 650 mg via RECTAL

## 2012-08-10 MED ORDER — OXYCODONE HCL 5 MG PO TABS
5.0000 mg | ORAL_TABLET | ORAL | Status: DC | PRN
  Administered 2012-08-11 (×2): 5 mg via ORAL
  Administered 2012-08-12 – 2012-08-13 (×4): 10 mg via ORAL
  Administered 2012-08-14: 5 mg via ORAL
  Filled 2012-08-10: qty 2
  Filled 2012-08-10: qty 1
  Filled 2012-08-10 (×5): qty 2

## 2012-08-10 MED ORDER — SODIUM CHLORIDE 0.9 % IJ SOLN
OROMUCOSAL | Status: DC | PRN
  Administered 2012-08-10: 15:00:00 via TOPICAL

## 2012-08-10 MED ORDER — ACETAMINOPHEN 160 MG/5ML PO SOLN: 650.0000 mg | ORAL | Status: AC

## 2012-08-10 MED ORDER — POTASSIUM CHLORIDE 10 MEQ/50ML IV SOLN: 10.0000 meq | INTRAVENOUS | Status: AC

## 2012-08-10 MED ORDER — MORPHINE SULFATE 2 MG/ML IJ SOLN
1.0000 mg | INTRAMUSCULAR | Status: AC | PRN
  Administered 2012-08-10: 2 mg via INTRAVENOUS
  Filled 2012-08-10: qty 1

## 2012-08-10 MED ORDER — PHENYLEPHRINE HCL 10 MG/ML IJ SOLN
0.0000 ug/min | INTRAMUSCULAR | Status: DC
  Administered 2012-08-10: 30 ug/min via INTRAVENOUS
  Administered 2012-08-11: 40 ug/min via INTRAVENOUS
  Filled 2012-08-10 (×3): qty 2

## 2012-08-10 MED ORDER — LACTATED RINGERS IV SOLN
INTRAVENOUS | Status: DC | PRN
  Administered 2012-08-10: 06:00:00 via INTRAVENOUS

## 2012-08-10 MED ORDER — ALBUMIN HUMAN 5 % IV SOLN
250.0000 mL | INTRAVENOUS | Status: AC | PRN
  Administered 2012-08-10 – 2012-08-11 (×3): 250 mL via INTRAVENOUS
  Filled 2012-08-10 (×3): qty 250

## 2012-08-10 MED ORDER — INSULIN REGULAR BOLUS VIA INFUSION
0.0000 [IU] | Freq: Three times a day (TID) | INTRAVENOUS | Status: DC
  Filled 2012-08-10: qty 10

## 2012-08-10 MED ORDER — MIDAZOLAM HCL 2 MG/2ML IJ SOLN: 2.0000 mg | INTRAMUSCULAR | Status: DC | PRN

## 2012-08-10 MED ORDER — NITROGLYCERIN IN D5W 200-5 MCG/ML-% IV SOLN: 0.0000 ug/min | INTRAVENOUS | Status: DC

## 2012-08-10 MED ORDER — ACETAMINOPHEN 160 MG/5ML PO SOLN
975.0000 mg | Freq: Four times a day (QID) | ORAL | Status: DC
  Administered 2012-08-11: 975 mg
  Filled 2012-08-10: qty 40.6

## 2012-08-10 MED ORDER — LEVALBUTEROL HCL 0.63 MG/3ML IN NEBU
0.6300 mg | INHALATION_SOLUTION | Freq: Four times a day (QID) | RESPIRATORY_TRACT | Status: DC
  Filled 2012-08-10 (×3): qty 3

## 2012-08-10 MED ORDER — MIDAZOLAM HCL 5 MG/5ML IJ SOLN
INTRAMUSCULAR | Status: DC | PRN
  Administered 2012-08-10: 6 mg via INTRAVENOUS
  Administered 2012-08-10: 3 mg via INTRAVENOUS
  Administered 2012-08-10 (×3): 2 mg via INTRAVENOUS

## 2012-08-10 MED ORDER — METOPROLOL TARTRATE 25 MG/10 ML ORAL SUSPENSION
12.5000 mg | Freq: Two times a day (BID) | ORAL | Status: DC
  Filled 2012-08-10 (×3): qty 5

## 2012-08-10 MED ORDER — HEPARIN SODIUM (PORCINE) 1000 UNIT/ML IJ SOLN
INTRAMUSCULAR | Status: DC | PRN
  Administered 2012-08-10: 40000 [IU] via INTRAVENOUS

## 2012-08-10 MED ORDER — NITROGLYCERIN IN D5W 200-5 MCG/ML-% IV SOLN
INTRAVENOUS | Status: DC | PRN
  Administered 2012-08-10: 5 ug/min via INTRAVENOUS

## 2012-08-10 MED ORDER — HEMOSTATIC AGENTS (NO CHARGE) OPTIME
TOPICAL | Status: DC | PRN
  Administered 2012-08-10: 1 via TOPICAL

## 2012-08-10 MED ORDER — ROCURONIUM BROMIDE 100 MG/10ML IV SOLN
INTRAVENOUS | Status: DC | PRN
  Administered 2012-08-10: 30 mg via INTRAVENOUS
  Administered 2012-08-10: 70 mg via INTRAVENOUS
  Administered 2012-08-10: 50 mg via INTRAVENOUS
  Administered 2012-08-10: 30 mg via INTRAVENOUS
  Administered 2012-08-10: 50 mg via INTRAVENOUS
  Administered 2012-08-10: 30 mg via INTRAVENOUS

## 2012-08-10 MED ORDER — VANCOMYCIN HCL IN DEXTROSE 1-5 GM/200ML-% IV SOLN
1000.0000 mg | Freq: Once | INTRAVENOUS | Status: AC
  Administered 2012-08-10: 1000 mg via INTRAVENOUS
  Filled 2012-08-10: qty 200

## 2012-08-10 MED ORDER — LACTATED RINGERS IV SOLN
500.0000 mL | Freq: Once | INTRAVENOUS | Status: AC | PRN
  Administered 2012-08-10: 500 mL via INTRAVENOUS

## 2012-08-10 MED ORDER — ONDANSETRON HCL 4 MG/2ML IJ SOLN: 4.0000 mg | Freq: Four times a day (QID) | INTRAMUSCULAR | Status: DC | PRN

## 2012-08-10 MED ORDER — SODIUM CHLORIDE 0.9 % IV SOLN: 250.0000 mL | INTRAVENOUS | Status: DC

## 2012-08-10 MED ORDER — MORPHINE SULFATE 2 MG/ML IJ SOLN
2.0000 mg | INTRAMUSCULAR | Status: DC | PRN
  Administered 2012-08-11 (×2): 2 mg via INTRAVENOUS
  Filled 2012-08-10 (×2): qty 1

## 2012-08-10 MED ORDER — LIDOCAINE HCL (CARDIAC) 20 MG/ML IV SOLN
INTRAVENOUS | Status: DC | PRN
  Administered 2012-08-10: 100 mg via INTRAVENOUS

## 2012-08-10 MED ORDER — ASPIRIN 81 MG PO CHEW: 324.0000 mg | CHEWABLE_TABLET | Freq: Every day | ORAL | Status: DC

## 2012-08-10 MED ORDER — DEXMEDETOMIDINE HCL IN NACL 200 MCG/50ML IV SOLN
0.4000 ug/kg/h | INTRAVENOUS | Status: DC
  Filled 2012-08-10: qty 50

## 2012-08-10 MED ORDER — METOPROLOL TARTRATE 1 MG/ML IV SOLN: 2.5000 mg | INTRAVENOUS | Status: DC | PRN

## 2012-08-10 MED ORDER — SODIUM CHLORIDE 0.9 % IJ SOLN
3.0000 mL | Freq: Two times a day (BID) | INTRAMUSCULAR | Status: DC
  Administered 2012-08-11 – 2012-08-14 (×7): 3 mL via INTRAVENOUS

## 2012-08-10 MED ORDER — ALBUMIN HUMAN 5 % IV SOLN
INTRAVENOUS | Status: AC
  Filled 2012-08-10: qty 250

## 2012-08-10 MED ORDER — SODIUM BICARBONATE 8.4 % IV SOLN
INTRAVENOUS | Status: AC
  Filled 2012-08-10: qty 50

## 2012-08-10 MED ORDER — DOCUSATE SODIUM 100 MG PO CAPS
200.0000 mg | ORAL_CAPSULE | Freq: Every day | ORAL | Status: DC
  Administered 2012-08-11 – 2012-08-14 (×4): 200 mg via ORAL
  Filled 2012-08-10 (×4): qty 2

## 2012-08-10 MED ORDER — DEXTROSE 5 % IV SOLN
1.5000 g | Freq: Two times a day (BID) | INTRAVENOUS | Status: AC
  Administered 2012-08-10 – 2012-08-12 (×4): 1.5 g via INTRAVENOUS
  Filled 2012-08-10 (×5): qty 1.5

## 2012-08-10 MED ORDER — PROPOFOL 10 MG/ML IV EMUL
INTRAVENOUS | Status: DC | PRN
  Administered 2012-08-10: 30 mg via INTRAVENOUS

## 2012-08-10 MED ORDER — LACTATED RINGERS IV SOLN
INTRAVENOUS | Status: DC | PRN
  Administered 2012-08-10 (×2): via INTRAVENOUS

## 2012-08-10 MED ORDER — FENTANYL CITRATE 0.05 MG/ML IJ SOLN
INTRAMUSCULAR | Status: DC | PRN
  Administered 2012-08-10: 50 ug via INTRAVENOUS
  Administered 2012-08-10 (×2): 500 ug via INTRAVENOUS
  Administered 2012-08-10: 100 ug via INTRAVENOUS
  Administered 2012-08-10 (×2): 50 ug via INTRAVENOUS
  Administered 2012-08-10: 150 ug via INTRAVENOUS
  Administered 2012-08-10: 250 ug via INTRAVENOUS
  Administered 2012-08-10 (×2): 50 ug via INTRAVENOUS

## 2012-08-10 MED ORDER — SODIUM CHLORIDE 0.45 % IV SOLN: INTRAVENOUS | Status: DC

## 2012-08-10 MED ORDER — MAGNESIUM SULFATE 40 MG/ML IJ SOLN
4.0000 g | Freq: Once | INTRAMUSCULAR | Status: AC
Start: 2012-08-10 — End: 2012-08-10
  Administered 2012-08-10: 4 g via INTRAVENOUS
  Filled 2012-08-10: qty 100

## 2012-08-10 MED ORDER — LEVALBUTEROL HCL 0.63 MG/3ML IN NEBU
0.6300 mg | INHALATION_SOLUTION | Freq: Four times a day (QID) | RESPIRATORY_TRACT | Status: DC
  Administered 2012-08-10 – 2012-08-12 (×9): 0.63 mg via RESPIRATORY_TRACT
  Filled 2012-08-10 (×11): qty 3

## 2012-08-10 MED ORDER — FAMOTIDINE IN NACL 20-0.9 MG/50ML-% IV SOLN
20.0000 mg | Freq: Two times a day (BID) | INTRAVENOUS | Status: AC
  Administered 2012-08-10 (×2): 20 mg via INTRAVENOUS
  Filled 2012-08-10: qty 50

## 2012-08-10 MED ORDER — DOPAMINE-DEXTROSE 3.2-5 MG/ML-% IV SOLN
2.0000 ug/kg/min | INTRAVENOUS | Status: DC
Start: 2012-08-10 — End: 2012-08-11
  Administered 2012-08-10: 3 ug/kg/min via INTRAVENOUS
  Filled 2012-08-10: qty 250

## 2012-08-10 MED ORDER — ASPIRIN EC 325 MG PO TBEC
325.0000 mg | DELAYED_RELEASE_TABLET | Freq: Every day | ORAL | Status: DC
  Administered 2012-08-11: 325 mg via ORAL
  Filled 2012-08-10 (×2): qty 1

## 2012-08-10 MED ORDER — PANTOPRAZOLE SODIUM 40 MG PO TBEC
40.0000 mg | DELAYED_RELEASE_TABLET | Freq: Every day | ORAL | Status: DC
  Administered 2012-08-12 – 2012-08-14 (×3): 40 mg via ORAL
  Filled 2012-08-10 (×3): qty 1

## 2012-08-10 MED ORDER — LIDOCAINE HCL (CARDIAC) 20 MG/ML IV SOLN
INTRAVENOUS | Status: AC
  Filled 2012-08-10: qty 5

## 2012-08-10 MED ORDER — SODIUM CHLORIDE 0.9 % IV SOLN
INTRAVENOUS | Status: DC
  Administered 2012-08-11: 3.9 [IU]/h via INTRAVENOUS
  Filled 2012-08-10 (×2): qty 1

## 2012-08-10 MED ORDER — ALBUMIN HUMAN 5 % IV SOLN
INTRAVENOUS | Status: DC | PRN
  Administered 2012-08-10 (×2): via INTRAVENOUS

## 2012-08-10 MED ORDER — SODIUM CHLORIDE 0.9 % IJ SOLN: 3.0000 mL | INTRAMUSCULAR | Status: DC | PRN

## 2012-08-10 SURGICAL SUPPLY — 181 items
ADAPTER CARDIO PERF ANTE/RETRO (ADAPTER) ×2 IMPLANT
APPLICATOR COTTON TIP 6IN STRL (MISCELLANEOUS) IMPLANT
ATTRACTOMAT 16X20 MAGNETIC DRP (DRAPES) ×2 IMPLANT
BAG DECANTER FOR FLEXI CONT (MISCELLANEOUS) ×2 IMPLANT
BANDAGE ELASTIC 4 VELCRO ST LF (GAUZE/BANDAGES/DRESSINGS) ×2 IMPLANT
BANDAGE ELASTIC 6 VELCRO ST LF (GAUZE/BANDAGES/DRESSINGS) ×2 IMPLANT
BANDAGE GAUZE ELAST BULKY 4 IN (GAUZE/BANDAGES/DRESSINGS) ×2 IMPLANT
BENZOIN TINCTURE PRP APPL 2/3 (GAUZE/BANDAGES/DRESSINGS) IMPLANT
BLADE STERNUM SYSTEM 6 (BLADE) ×2 IMPLANT
BLADE SURG 11 STRL SS (BLADE) ×2 IMPLANT
BLADE SURG 15 STRL LF DISP TIS (BLADE) ×1 IMPLANT
BLADE SURG 15 STRL SS (BLADE) ×1
BLADE SURG ROTATE 9660 (MISCELLANEOUS) ×2 IMPLANT
BOOT SUTURE AID YELLOW STND (SUTURE) ×2 IMPLANT
CANISTER SUCTION 2500CC (MISCELLANEOUS) ×2 IMPLANT
CANN PRFSN .5XCNCT 15X34-48 (MISCELLANEOUS) ×1
CANNULA AORTIC HI-FLOW 6.5M20F (CANNULA) ×2 IMPLANT
CANNULA ARTERIAL 007325 (MISCELLANEOUS) IMPLANT
CANNULA ARTERIAL 14F 007324 (MISCELLANEOUS) IMPLANT
CANNULA ARTERIAL 18F 007308 (MISCELLANEOUS) IMPLANT
CANNULA ARTERIAL 20F L7309 (MISCELLANEOUS) IMPLANT
CANNULA ARTERIAL 22F 007310 (MISCELLANEOUS) IMPLANT
CANNULA ARTERIAL 24F 007311 (MISCELLANEOUS) IMPLANT
CANNULA GUNDRY RCSP 15FR (MISCELLANEOUS) ×2 IMPLANT
CANNULA PRFSN .5XCNCT 15X34-48 (MISCELLANEOUS) ×1 IMPLANT
CANNULA VEN 2 STAGE (MISCELLANEOUS) ×3 IMPLANT
CANNULA VENOUS DUAL 32/40 (CANNULA) IMPLANT
CATH CPB KIT GERHARDT (MISCELLANEOUS) ×2 IMPLANT
CATH HEART VENT LEFT (CATHETERS) ×1 IMPLANT
CATH RETROPLEGIA CORONARY 14FR (CATHETERS) ×2 IMPLANT
CATH THORACIC 28FR (CATHETERS) ×2 IMPLANT
CATH THORACIC 36FR (CATHETERS) IMPLANT
CATH THORACIC 36FR RT ANG (CATHETERS) IMPLANT
CATH/SQUID NICHOLS JEHLE COR (CATHETERS) IMPLANT
CAUTERY EYE LOW TEMP 1300F FIN (OPHTHALMIC RELATED) ×2 IMPLANT
CLIP RETRACTION 3.0MM CORONARY (MISCELLANEOUS) ×2 IMPLANT
CLIP TI MEDIUM 24 (CLIP) IMPLANT
CLIP TI MEDIUM 6 (CLIP) ×2 IMPLANT
CLIP TI WIDE RED SMALL 24 (CLIP) IMPLANT
CLIP TI WIDE RED SMALL 6 (CLIP) IMPLANT
CLOTH BEACON ORANGE TIMEOUT ST (SAFETY) ×2 IMPLANT
CONN 1/2X1/2X1/2  BEN (MISCELLANEOUS)
CONN 1/2X1/2X1/2 BEN (MISCELLANEOUS) IMPLANT
CONN 3/8X3/8 GISH STERILE (MISCELLANEOUS) IMPLANT
CONN ST 1/4X3/8  BEN (MISCELLANEOUS) ×1
CONN ST 1/4X3/8 BEN (MISCELLANEOUS) ×1 IMPLANT
CONN Y 3/8X3/8X3/8  BEN (MISCELLANEOUS)
CONN Y 3/8X3/8X3/8 BEN (MISCELLANEOUS) IMPLANT
CONT SPEC STER OR (MISCELLANEOUS) ×2 IMPLANT
COVER SURGICAL LIGHT HANDLE (MISCELLANEOUS) ×4 IMPLANT
CRADLE DONUT ADULT HEAD (MISCELLANEOUS) ×2 IMPLANT
DRAIN CHANNEL 15F RND FF W/TCR (WOUND CARE) IMPLANT
DRAIN CHANNEL 19F RND (DRAIN) IMPLANT
DRAIN CHANNEL 28F RND 3/8 FF (WOUND CARE) ×2 IMPLANT
DRAIN CHANNEL 32F RND 10.7 FF (WOUND CARE) IMPLANT
DRAIN SNY 10X20 3/4 PERF (WOUND CARE) IMPLANT
DRAIN WOUND SNY 15 RND (WOUND CARE) IMPLANT
DRAPE CARDIOVASCULAR INCISE (DRAPES) ×1
DRAPE SLUSH MACHINE 52X66 (DRAPES) IMPLANT
DRAPE SLUSH/WARMER DISC (DRAPES) IMPLANT
DRAPE SRG 135X102X78XABS (DRAPES) ×1 IMPLANT
DRSG COVADERM 4X14 (GAUZE/BANDAGES/DRESSINGS) ×2 IMPLANT
ELECT BLADE 4.0 EZ CLEAN MEGAD (MISCELLANEOUS) ×2
ELECT CAUTERY BLADE 6.4 (BLADE) ×2 IMPLANT
ELECT REM PT RETURN 9FT ADLT (ELECTROSURGICAL) ×4
ELECTRODE BLDE 4.0 EZ CLN MEGD (MISCELLANEOUS) ×1 IMPLANT
ELECTRODE REM PT RTRN 9FT ADLT (ELECTROSURGICAL) ×2 IMPLANT
EVACUATOR SILICONE 100CC (DRAIN) IMPLANT
FELT TEFLON 6X6 (MISCELLANEOUS) ×2 IMPLANT
GLOVE BIO SURGEON STRL SZ 6 (GLOVE) ×6 IMPLANT
GLOVE BIO SURGEON STRL SZ 6.5 (GLOVE) ×6 IMPLANT
GLOVE BIO SURGEON STRL SZ7 (GLOVE) ×4 IMPLANT
GLOVE BIO SURGEON STRL SZ7.5 (GLOVE) IMPLANT
GLOVE BIOGEL M STER SZ 6 (GLOVE) ×2 IMPLANT
GLOVE BIOGEL PI IND STRL 6 (GLOVE) IMPLANT
GLOVE BIOGEL PI IND STRL 6.5 (GLOVE) IMPLANT
GLOVE BIOGEL PI IND STRL 7.0 (GLOVE) ×4 IMPLANT
GLOVE BIOGEL PI INDICATOR 6 (GLOVE)
GLOVE BIOGEL PI INDICATOR 6.5 (GLOVE)
GLOVE BIOGEL PI INDICATOR 7.0 (GLOVE) ×4
GLOVE EUDERMIC 7 POWDERFREE (GLOVE) IMPLANT
GLOVE ORTHO TXT STRL SZ7.5 (GLOVE) IMPLANT
GOWN STRL NON-REIN LRG LVL3 (GOWN DISPOSABLE) ×14 IMPLANT
GRAFT GELWEAVE VALSALVA 26CM (Prosthesis & Implant Heart) ×2 IMPLANT
HEMOSTAT POWDER SURGIFOAM 1G (HEMOSTASIS) ×6 IMPLANT
HEMOSTAT SURGICEL 2X14 (HEMOSTASIS) ×2 IMPLANT
INSERT FOGARTY 61MM (MISCELLANEOUS) IMPLANT
INSERT FOGARTY SM (MISCELLANEOUS) ×2 IMPLANT
INSERT FOGARTY XLG (MISCELLANEOUS) IMPLANT
KIT BASIN OR (CUSTOM PROCEDURE TRAY) ×2 IMPLANT
KIT PAIN CUSTOM (MISCELLANEOUS) IMPLANT
KIT ROOM TURNOVER OR (KITS) ×2 IMPLANT
KIT SUCTION CATH 14FR (SUCTIONS) ×4 IMPLANT
KIT VASOVIEW W/TROCAR VH 2000 (KITS) ×2 IMPLANT
LEAD PACING MYOCARDI (MISCELLANEOUS) ×2 IMPLANT
LINE VENT (MISCELLANEOUS) ×2 IMPLANT
LOOP VESSEL SUPERMAXI WHITE (MISCELLANEOUS) ×2 IMPLANT
MARKER GRAFT CORONARY BYPASS (MISCELLANEOUS) ×6 IMPLANT
NEEDLE 18GX1X1/2 (RX/OR ONLY) (NEEDLE) ×2 IMPLANT
NEEDLE AORTIC AIR ASPIRATING (NEEDLE) IMPLANT
NS IRRIG 1000ML POUR BTL (IV SOLUTION) ×12 IMPLANT
PACK OPEN HEART (CUSTOM PROCEDURE TRAY) ×2 IMPLANT
PAD ARMBOARD 7.5X6 YLW CONV (MISCELLANEOUS) ×4 IMPLANT
PENCIL BUTTON HOLSTER BLD 10FT (ELECTRODE) ×2 IMPLANT
PUNCH AORTIC ROTATE 4.0MM (MISCELLANEOUS) ×2 IMPLANT
PUNCH AORTIC ROTATE 4.5MM 8IN (MISCELLANEOUS) IMPLANT
PUNCH AORTIC ROTATE 5MM 8IN (MISCELLANEOUS) IMPLANT
SEALANT SURG COSEAL 8ML (VASCULAR PRODUCTS) ×2 IMPLANT
SET CARDIOPLEGIA MPS 5001102 (MISCELLANEOUS) ×2 IMPLANT
SOLUTION ANTI FOG 6CC (MISCELLANEOUS) ×2 IMPLANT
SPONGE GAUZE 4X4 12PLY (GAUZE/BANDAGES/DRESSINGS) ×4 IMPLANT
SPONGE LAP 18X18 X RAY DECT (DISPOSABLE) IMPLANT
SPONGE LAP 4X18 X RAY DECT (DISPOSABLE) IMPLANT
STAPLER VISISTAT 35W (STAPLE) IMPLANT
STOPCOCK 4 WAY LG BORE MALE ST (IV SETS) IMPLANT
STRIP CLOSURE SKIN 1/2X4 (GAUZE/BANDAGES/DRESSINGS) IMPLANT
SUT BONE WAX W31G (SUTURE) ×2 IMPLANT
SUT ETHIBON 2 0 V 52N 30 (SUTURE) ×6 IMPLANT
SUT ETHIBOND 2 0 SH (SUTURE) ×4
SUT ETHIBOND 2 0 SH 36X2 (SUTURE) ×4 IMPLANT
SUT ETHIBOND NAB MH 2-0 36IN (SUTURE) ×2 IMPLANT
SUT MNCRL AB 3-0 PS2 18 (SUTURE) IMPLANT
SUT MNCRL AB 4-0 PS2 18 (SUTURE) ×4 IMPLANT
SUT PROLENE 3 0 RB 1 (SUTURE) ×4 IMPLANT
SUT PROLENE 3 0 SH 1 (SUTURE) ×10 IMPLANT
SUT PROLENE 3 0 SH DA (SUTURE) ×2 IMPLANT
SUT PROLENE 3 0 SH1 36 (SUTURE) ×2 IMPLANT
SUT PROLENE 4 0 RB 1 (SUTURE) ×4
SUT PROLENE 4 0 SH DA (SUTURE) IMPLANT
SUT PROLENE 4 0 TF (SUTURE) ×4 IMPLANT
SUT PROLENE 4-0 RB1 .5 CRCL 36 (SUTURE) ×4 IMPLANT
SUT PROLENE 5 0 C 1 36 (SUTURE) ×14 IMPLANT
SUT PROLENE 6 0 C 1 30 (SUTURE) ×4 IMPLANT
SUT PROLENE 6 0 CC (SUTURE) ×6 IMPLANT
SUT PROLENE 7 0 BV 1 (SUTURE) IMPLANT
SUT PROLENE 7 0 BV1 MDA (SUTURE) ×4 IMPLANT
SUT PROLENE 7.0 RB 3 (SUTURE) IMPLANT
SUT PROLENE 8 0 BV175 6 (SUTURE) ×8 IMPLANT
SUT SILK  1 MH (SUTURE)
SUT SILK 1 MH (SUTURE) IMPLANT
SUT SILK 1 TIES 10X30 (SUTURE) ×2 IMPLANT
SUT SILK 2 0 SH CR/8 (SUTURE) ×4 IMPLANT
SUT SILK 2 0 TIES 17X18 (SUTURE) ×1
SUT SILK 2-0 18XBRD TIE BLK (SUTURE) ×1 IMPLANT
SUT SILK 3 0 SH CR/8 (SUTURE) ×2 IMPLANT
SUT SILK 4 0 TIES 17X18 (SUTURE) ×2 IMPLANT
SUT STEEL 6MS V (SUTURE) ×2 IMPLANT
SUT STEEL STERNAL CCS#1 18IN (SUTURE) IMPLANT
SUT STEEL SZ 6 DBL 3X14 BALL (SUTURE) ×2 IMPLANT
SUT TEM PAC WIRE 2 0 SH (SUTURE) ×4 IMPLANT
SUT VIC AB 1 CT1 18XCR BRD 8 (SUTURE) IMPLANT
SUT VIC AB 1 CT1 8-18 (SUTURE)
SUT VIC AB 1 CTX 18 (SUTURE) ×4 IMPLANT
SUT VIC AB 1 CTX 27 (SUTURE) ×4 IMPLANT
SUT VIC AB 1 CTX 36 (SUTURE)
SUT VIC AB 1 CTX36XBRD ANBCTR (SUTURE) IMPLANT
SUT VIC AB 2-0 CT1 27 (SUTURE) ×2
SUT VIC AB 2-0 CT1 TAPERPNT 27 (SUTURE) ×2 IMPLANT
SUT VIC AB 2-0 CTX 27 (SUTURE) IMPLANT
SUT VIC AB 2-0 CTX 36 (SUTURE) ×2 IMPLANT
SUT VIC AB 3-0 SH 27 (SUTURE)
SUT VIC AB 3-0 SH 27X BRD (SUTURE) IMPLANT
SUT VIC AB 3-0 X1 27 (SUTURE) IMPLANT
SUT VICRYL 4-0 PS2 18IN ABS (SUTURE) IMPLANT
SUTURE E-PAK OPEN HEART (SUTURE) ×4 IMPLANT
SYR 10ML KIT SKIN ADHESIVE (MISCELLANEOUS) IMPLANT
SYSTEM SAHARA CHEST DRAIN ATS (WOUND CARE) ×2 IMPLANT
TAPE CLOTH SOFT 2X10 (GAUZE/BANDAGES/DRESSINGS) ×2 IMPLANT
TOWEL OR 17X24 6PK STRL BLUE (TOWEL DISPOSABLE) ×4 IMPLANT
TOWEL OR 17X26 10 PK STRL BLUE (TOWEL DISPOSABLE) ×4 IMPLANT
TRAY CATH LUMEN 1 20CM STRL (SET/KITS/TRAYS/PACK) IMPLANT
TRAY FOLEY IC TEMP SENS 14FR (CATHETERS) ×2 IMPLANT
TUBE FEEDING 8FR 16IN STR KANG (MISCELLANEOUS) ×2 IMPLANT
TUBE SUCT ARGYLE STRL (TUBING) ×2 IMPLANT
TUBE SUCT INTRACARD DLP 20F (MISCELLANEOUS) ×2 IMPLANT
TUBING INSUFFLATION 10FT LAP (TUBING) ×2 IMPLANT
UNDERPAD 30X30 INCONTINENT (UNDERPADS AND DIAPERS) ×2 IMPLANT
VALVE AORTIC MAGNA 21MM (Prosthesis & Implant Heart) ×2 IMPLANT
VENT LEFT HEART 12002 (CATHETERS) ×2
WATER STERILE IRR 1000ML POUR (IV SOLUTION) ×4 IMPLANT
YANKAUER SUCT BULB TIP NO VENT (SUCTIONS) ×2 IMPLANT

## 2012-08-10 NOTE — Anesthesia Preprocedure Evaluation (Addendum)
Anesthesia Evaluation  Patient identified by MRN, date of birth, ID band Patient awake    Reviewed: Allergy & Precautions, H&P , NPO status , Patient's Chart, lab work & pertinent test results  History of Anesthesia Complications Negative for: history of anesthetic complications  Airway Mallampati: II TM Distance: >3 FB Neck ROM: Full    Dental  (+) Edentulous Upper and Dental Advisory Given   Pulmonary shortness of breath and with exertion,    Pulmonary exam normal + decreased breath sounds      Cardiovascular Exercise Tolerance: Poor hypertension, Pt. on medications + CAD + Valvular Problems/Murmurs AI Rhythm:Regular Rate:Normal + Diastolic murmurs    Neuro/Psych negative neurological ROS  negative psych ROS   GI/Hepatic negative GI ROS, Neg liver ROS,   Endo/Other  negative endocrine ROSHypothyroidism   Renal/GU      Musculoskeletal   Abdominal (+) + obese,  Abdomen: soft.    Peds  Hematology negative hematology ROS (+)   Anesthesia Other Findings   Reproductive/Obstetrics                          Anesthesia Physical Anesthesia Plan  ASA: III  Anesthesia Plan: General   Post-op Pain Management:    Induction: Intravenous  Airway Management Planned: Oral ETT  Additional Equipment: Arterial line, 3D TEE, CVP, PA Cath, TEE and Ultrasound Guidance Line Placement  Intra-op Plan: Utilization Of Total Body Hypothermia per surgeon request and Delibrate Circulatory arrest per surgeon request  Post-operative Plan: Post-operative intubation/ventilation  Informed Consent: I have reviewed the patients History and Physical, chart, labs and discussed the procedure including the risks, benefits and alternatives for the proposed anesthesia with the patient or authorized representative who has indicated his/her understanding and acceptance.   Dental advisory given  Plan Discussed with: CRNA  and Surgeon  Anesthesia Plan Comments:        Anesthesia Quick Evaluation

## 2012-08-10 NOTE — Anesthesia Postprocedure Evaluation (Signed)
  Anesthesia Post-op Note  Patient: Miranda Hutchinson  Procedure(s) Performed: Procedure(s) (LRB): CORONARY ARTERY BYPASS GRAFTING (CABG) (N/A) THORACIC ASCENDING ANEURYSM REPAIR (AAA) (N/A) AORTIC VALVE REPLACEMENT (AVR) (N/A)  Patient Location: ICU  Anesthesia Type: General  Level of Consciousness: Patient remains intubated per anesthesia plan  Airway and Oxygen Therapy: Patient remains intubated per anesthesia plan  Post-op Pain: none  Post-op Assessment: Post-op Vital signs reviewed, Patient's Cardiovascular Status Stable, Respiratory Function Stable, Patent Airway, No signs of Nausea or vomiting and Pain level controlled  Post-op Vital Signs: stable  Complications: No apparent anesthesia complications

## 2012-08-10 NOTE — Transfer of Care (Signed)
Immediate Anesthesia Transfer of Care Note  Patient: Miranda Hutchinson  Procedure(s) Performed: Procedure(s) (LRB): CORONARY ARTERY BYPASS GRAFTING (CABG) (N/A) THORACIC ASCENDING ANEURYSM REPAIR (AAA) (N/A) AORTIC VALVE REPLACEMENT (AVR) (N/A)  Patient Location: SICU  Anesthesia Type: General  Level of Consciousness: Patient remains intubated per anesthesia plan  Airway & Oxygen Therapy: Patient remains intubated per anesthesia plan and Patient placed on Ventilator (see vital sign flow sheet for setting)  Post-op Assessment: Report given to PACU RN  Post vital signs: Reviewed and stable  Complications: No apparent anesthesia complications

## 2012-08-10 NOTE — Addendum Note (Signed)
Addendum  created 08/10/12 2007 by Sharlet Salina, CRNA   Modules edited:Charges VN

## 2012-08-10 NOTE — Progress Notes (Signed)
  Echocardiogram Echocardiogram Transesophageal has been performed.  Georgian Co 08/10/2012, 9:54 AM

## 2012-08-10 NOTE — Progress Notes (Signed)
[facility] BRIEF SICU PROGRESS NOTE  Day of Surgery  S/P Procedure(s) (LRB): CORONARY ARTERY BYPASS GRAFTING (CABG) (N/A) THORACIC ASCENDING ANEURYSM REPAIR (AAA) (N/A) AORTIC VALVE REPLACEMENT (AVR) (N/A)   Sedated on vent AV paced, BP stable PA pressures low, C.I. 1.6 Chest tube output low UOP excellent Labs okay  Plan: Continue routine early postop  Momo Braun H 08/10/2012 8:13 PM

## 2012-08-10 NOTE — Preoperative (Signed)
Beta Blockers   Reason not to administer Beta Blockers:Not Applicable 

## 2012-08-10 NOTE — Anesthesia Procedure Notes (Signed)
Procedures Procedures: Right IJ Miranda Hutchinson Catheter Insertion: [id number]: The patient was identified and consent obtained.  TO was performed, and full barrier precautions were used.  The skin was anesthetized with lidocaine-4cc plain with 25g needle.  Once the vein was located with the 22 ga. needle using ultrasound guidance , the wire was inserted into the vein.  The wire location was confirmed with ultrasound.  The tissue was dilated and the 8.5 [place] cordis catheter was carefully inserted. Afterwards Miranda Hutchinson catheter was inserted. PA catheter at 47cm.  The patient tolerated the procedure well. CE

## 2012-08-10 NOTE — Brief Op Note (Addendum)
08/04/2012 - 08/10/2012  2:10 PM  PATIENT:  Miranda Hutchinson  66 y.o. female  PRE-OPERATIVE DIAGNOSIS:  CAD; aortic root aneurysm; aortic insufficiency  POST-OPERATIVE DIAGNOSIS:  CAD; aortic root aneurysm; aortic insufficiency, question of old healed aortic dissection  PROCEDURE:  Procedure(s) (LRB):  CORONARY ARTERY BYPASS GRAFTING x3  LIMA to LAD  SVG to OM1  SVG to PDA   Biologic BENTALL PROCEDURE -21mm Edwards Pericardial Tissue Valve 26mm Valsalva tube graft  ENDOSCOPIC SAPHENOUS VEIN HARVEST RIGHT LEG  SURGEON:  Surgeon(s) and Role:    * Delight Ovens, MD - Primary  PHYSICIAN ASSISTANT: Erin Barrett PA-C  ANESTHESIA:   general  EBL:  Total I/O In: 2000 [I.V.:2000] Out: 250 [Urine:250]  BLOOD ADMINISTERED: CC CELLSAVER  DRAINS: Left pleural chest tube, Mediastinal drains   LOCAL MEDICATIONS USED:  NONE  SPECIMEN:  Source of Specimen:  Ascending Aorta, Aortic Valve Leaflets  DISPOSITION OF SPECIMEN:  PATHOLOGY  COUNTS:  YES   DICTATION: .Dragon Dictation  PLAN OF CARE: Admit to inpatient   PATIENT DISPOSITION:  ICU - intubated and hemodynamically stable.   Delay start of Pharmacological VTE agent (>24hrs) due to surgical blood loss or risk of bleeding: yes

## 2012-08-10 NOTE — Progress Notes (Signed)
Post ABG rate increased to 14 and fio2 increased to 70%

## 2012-08-11 ENCOUNTER — Inpatient Hospital Stay (HOSPITAL_COMMUNITY): Payer: Medicare Other

## 2012-08-11 ENCOUNTER — Encounter (HOSPITAL_COMMUNITY): Payer: Self-pay | Admitting: Cardiothoracic Surgery

## 2012-08-11 LAB — BASIC METABOLIC PANEL
BUN: 14 mg/dL (ref 6–23)
CO2: 22 mEq/L (ref 19–32)
Calcium: 7.8 mg/dL — ABNORMAL LOW (ref 8.4–10.5)
Calcium: 7.9 mg/dL — ABNORMAL LOW (ref 8.4–10.5)
Chloride: 106 mEq/L (ref 96–112)
Creatinine, Ser: 0.84 mg/dL (ref 0.50–1.10)
GFR calc Af Amer: 81 mL/min — ABNORMAL LOW (ref >90)
GFR calc non Af Amer: 69 mL/min — ABNORMAL LOW (ref >90)
GFR calc non Af Amer: 70 mL/min — ABNORMAL LOW (ref >90)
Glucose, Bld: 118 mg/dL — ABNORMAL HIGH (ref 70–99)
Glucose, Bld: 176 mg/dL — ABNORMAL HIGH (ref 70–99)
Potassium: 4.8 mEq/L (ref 3.5–5.1)
Potassium: 4.9 mEq/L (ref 3.5–5.1)
Sodium: 140 mEq/L (ref 135–145)
Sodium: 138 mEq/L (ref 135–145)

## 2012-08-11 LAB — POCT I-STAT 3, ART BLOOD GAS (G3+)
Acid-base deficit: 2 mmol/L (ref 0.0–2.0)
Acid-base deficit: 5 mmol/L — ABNORMAL HIGH (ref 0.0–2.0)
Bicarbonate: 22.5 mEq/L (ref 20.0–24.0)
O2 Saturation: 89 %
O2 Saturation: 94 %
Patient temperature: 37.4
Patient temperature: 37.7
Patient temperature: 37.7
TCO2: 23 mmol/L (ref 0–100)
pCO2 arterial: 39.4 mmHg (ref 35.0–45.0)
pH, Arterial: 7.352 (ref 7.350–7.450)
pH, Arterial: 7.364 (ref 7.350–7.450)

## 2012-08-11 LAB — POCT I-STAT, CHEM 8
BUN: 13 mg/dL (ref 6–23)
Calcium, Ion: 1.11 mmol/L — ABNORMAL LOW (ref 1.13–1.30)
Chloride: 106 mEq/L (ref 96–112)
Creatinine, Ser: 0.9 mg/dL (ref 0.50–1.10)
Creatinine, Ser: 0.9 mg/dL (ref 0.50–1.10)
Glucose, Bld: 157 mg/dL — ABNORMAL HIGH (ref 70–99)
Glucose, Bld: 165 mg/dL — ABNORMAL HIGH (ref 70–99)
HCT: 26 % — ABNORMAL LOW (ref 36.0–46.0)
Hemoglobin: 8.8 g/dL — ABNORMAL LOW (ref 12.0–15.0)
Hemoglobin: 7.5 g/dL — ABNORMAL LOW (ref 12.0–15.0)
Potassium: 4.5 mEq/L (ref 3.5–5.1)
Sodium: 135 mEq/L (ref 135–145)

## 2012-08-11 LAB — CBC
HCT: 25.9 % — ABNORMAL LOW (ref 36.0–46.0)
Hemoglobin: 9 g/dL — ABNORMAL LOW (ref 12.0–15.0)
Hemoglobin: 8.7 g/dL — ABNORMAL LOW (ref 12.0–15.0)
Hemoglobin: 8.5 g/dL — ABNORMAL LOW (ref 12.0–15.0)
MCH: 29.8 pg (ref 26.0–34.0)
MCH: 28.8 pg (ref 26.0–34.0)
MCH: 29.6 pg (ref 26.0–34.0)
MCHC: 34.9 g/dL (ref 30.0–36.0)
MCHC: 33.6 g/dL (ref 30.0–36.0)
MCV: 85.8 fL (ref 78.0–100.0)
Platelets: 114 10*3/uL — ABNORMAL LOW (ref 150–400)
Platelets: 93 10*3/uL — ABNORMAL LOW (ref 150–400)
Platelets: 77 10*3/uL — ABNORMAL LOW (ref 150–400)
RBC: 3.02 MIL/uL — ABNORMAL LOW (ref 3.87–5.11)
RBC: 3.02 MIL/uL — ABNORMAL LOW (ref 3.87–5.11)
RBC: 2.87 MIL/uL — ABNORMAL LOW (ref 3.87–5.11)
RDW: 13.4 % (ref 11.5–15.5)
WBC: 12.6 10*3/uL — ABNORMAL HIGH (ref 4.0–10.5)
WBC: 11.4 10*3/uL — ABNORMAL HIGH (ref 4.0–10.5)

## 2012-08-11 LAB — MAGNESIUM
Magnesium: 2.9 mg/dL — ABNORMAL HIGH (ref 1.5–2.5)
Magnesium: 2.7 mg/dL — ABNORMAL HIGH (ref 1.5–2.5)

## 2012-08-11 LAB — GLUCOSE, CAPILLARY
Glucose-Capillary: 124 mg/dL — ABNORMAL HIGH (ref 70–99)
Glucose-Capillary: 165 mg/dL — ABNORMAL HIGH (ref 70–99)
Glucose-Capillary: 155 mg/dL — ABNORMAL HIGH (ref 70–99)
Glucose-Capillary: 116 mg/dL — ABNORMAL HIGH (ref 70–99)

## 2012-08-11 LAB — CREATININE, SERUM
Creatinine, Ser: 0.88 mg/dL (ref 0.50–1.10)
GFR calc non Af Amer: 66 mL/min — ABNORMAL LOW (ref >90)

## 2012-08-11 MED ORDER — INSULIN ASPART 100 UNIT/ML ~~LOC~~ SOLN
0.0000 [IU] | SUBCUTANEOUS | Status: DC
  Administered 2012-08-11: 2 [IU] via SUBCUTANEOUS
  Administered 2012-08-11: 4 [IU] via SUBCUTANEOUS
  Administered 2012-08-11: 2 [IU] via SUBCUTANEOUS
  Administered 2012-08-12: 4 [IU] via SUBCUTANEOUS
  Administered 2012-08-12 – 2012-08-13 (×4): 2 [IU] via SUBCUTANEOUS

## 2012-08-11 MED ORDER — BOOST / RESOURCE BREEZE PO LIQD
1.0000 | Freq: Every day | ORAL | Status: DC
  Administered 2012-08-11 – 2012-08-15 (×4): 1 via ORAL

## 2012-08-11 MED ORDER — METOPROLOL TARTRATE 25 MG/10 ML ORAL SUSPENSION
12.5000 mg | Freq: Two times a day (BID) | ORAL | Status: DC
  Administered 2012-08-13: 12.5 mg
  Filled 2012-08-11 (×6): qty 5

## 2012-08-11 MED ORDER — FUROSEMIDE 10 MG/ML IJ SOLN
20.0000 mg | Freq: Once | INTRAMUSCULAR | Status: AC
  Administered 2012-08-11: 20 mg via INTRAVENOUS

## 2012-08-11 MED ORDER — METOPROLOL TARTRATE 12.5 MG HALF TABLET
12.5000 mg | ORAL_TABLET | Freq: Two times a day (BID) | ORAL | Status: DC
  Administered 2012-08-12 – 2012-08-14 (×4): 12.5 mg via ORAL
  Filled 2012-08-11 (×6): qty 1

## 2012-08-11 NOTE — Procedures (Signed)
Extubation Procedure Note  Patient Details:   Name: Miranda Hutchinson DOB: December 18, 1946 MRN: [id number]   Airway Documentation:  Airway 8 mm (Active)  Secured at (cm) 22 cm 08/11/2012  2:00 AM  Measured From Lips 08/11/2012  2:00 AM  Secured Location Right 08/11/2012  2:00 AM  Secured By Pink Tape 08/11/2012  2:00 AM  Cuff Pressure (cm H2O) 30 cm H2O 08/10/2012  4:35 PM    Evaluation  O2 sats: stable throughout Complications: No apparent complications Patient did tolerate procedure well. Bilateral Breath Sounds: Clear   Yes   Juleon Narang 08/11/2012, 3:18 AM

## 2012-08-11 NOTE — Progress Notes (Signed)
Rapid wean criteria met.  Patient weaned on SIMV 4 and FIO2 at 40% and met criteria for CPAP.  After 20 minutes CPAP, ABG drawn and parameters assessed.    ABG:  pH, Arterial 7.355 pCO2 arterial 36.0 mmHg  pO2, Arterial 61.0 mmHg L  Bicarbonate 20.0 mEq/L  TCO2 21 mmol/L  O2 Saturation 89.0 %  Acid-base deficit 5.0 mmol/L H   Patient did not pass mechanics per RT, with vital capacity = 550 and NIF=30.  Unable to extubate d/t oxygen saturation 89% and failure of mechanics.  Will continue to monitor and re-attempt extubation per protocol.   Flinch[place], Fonda Kinder., RN

## 2012-08-11 NOTE — Progress Notes (Addendum)
1 Day Post-Op Procedure(s) (LRB): CORONARY ARTERY BYPASS GRAFTING (CABG) (N/A) THORACIC ASCENDING ANEURYSM REPAIR (AAA) (N/A) AORTIC VALVE REPLACEMENT (AVR) (N/A)  Subjective: Patient awake and alert. No complaints.  Objective: Vital signs in last 24 hours: Patient Vitals for the past 24 hrs:  BP Temp Temp src Pulse Resp SpO2 Weight  08/11/12 0700 103/40 mmHg 99.9 F (37.7 C) - 85  - 96 % -  08/11/12 0645 - 99.9 F (37.7 C) - 85  - 96 % -  08/11/12 0630 99/49 mmHg 99.9 F (37.7 C) - 85  25  93 % -  08/11/12 0615 - 99.9 F (37.7 C) - 84  25  96 % -  08/11/12 0600 105/45 mmHg 99.9 F (37.7 C) Core 85  24  96 % -  08/11/12 0530 83/33 mmHg 99.7 F (37.6 C) - 81  24  96 % -  08/11/12 0500 83/39 mmHg - - 80  26  96 % 227 lb 1.2 oz (103 kg)  08/11/12 0430 88/65 mmHg 99.9 F (37.7 C) - 77  25  98 % -  08/11/12 0400 105/42 mmHg 99.9 F (37.7 C) Core 77  23  99 % -  08/11/12 0330 102/44 mmHg 99.9 F (37.7 C) - 74  22  100 % -  08/11/12 0300 101/45 mmHg 99.9 F (37.7 C) - 72  24  98 % -  08/11/12 0230 99/44 mmHg 99.9 F (37.7 C) - 71  24  97 % -  08/11/12 0200 98/43 mmHg 99.7 F (37.6 C) Core 70  17  97 % -  08/11/12 0130 96/39 mmHg 99.7 F (37.6 C) - 70  19  97 % -  08/11/12 0100 95/39 mmHg 99.5 F (37.5 C) - 74  20  95 % -  08/11/12 0045 - 99.5 F (37.5 C) - 72  19  96 % -  08/11/12 0030 84/37 mmHg 99.5 F (37.5 C) - 73  20  95 % -  08/11/12 0015 - 99.3 F (37.4 C) - 79  20  93 % -  08/11/12 0000 81/44 mmHg 99.3 F (37.4 C) Core 82  23  95 % -  08/10/12 2345 - 99.1 F (37.3 C) - 81  24  95 % -  08/10/12 2330 101/42 mmHg 99 F (37.2 C) - 83  24  96 % -  08/10/12 2315 - 98.8 F (37.1 C) - 82  20  95 % -  08/10/12 2300 106/44 mmHg 98.4 F (36.9 C) - 82  17  96 % -  08/10/12 2245 - 98.4 F (36.9 C) - 77  14  96 % -  08/10/12 2230 102/43 mmHg 98.2 F (36.8 C) - 77  14  96 % -  08/10/12 2215 - 98.2 F (36.8 C) - 75  14  98 % -  08/10/12 2200 106/44 mmHg 98.1 F (36.7  C) Core 75  19  98 % -  08/10/12 2145 - 97.9 F (36.6 C) - 73  14  99 % -  08/10/12 2130 102/44 mmHg 97.9 F (36.6 C) - 72  14  98 % -  08/10/12 2115 - 96.8 F (36 C) - 74  14  98 % -  08/10/12 2100 106/43 mmHg 97.7 F (36.5 C) - 74  14  98 % -  08/10/12 2045 - 97.9 F (36.6 C) - 74  14  98 % -  08/10/12 2030 96/42 mmHg 97.9 F (36.6 C) - 77  14  98 % -  08/10/12 2015 - 98.1 F (36.7 C) - 81  14  99 % -  08/10/12 2006 - - - - - 99 % -  08/10/12 2000 91/46 mmHg 98.2 F (36.8 C) Core 82  14  99 % -  08/10/12 1945 - 98.2 F (36.8 C) - 85  14  99 % -  08/10/12 1930 - 98.2 F (36.8 C) - 88  14  98 % -  08/10/12 1915 - 98.2 F (36.8 C) - 90  14  98 % -  08/10/12 1900 87/42 mmHg 98.1 F (36.7 C) Core 96  14  98 % -  08/10/12 1845 - 98.1 F (36.7 C) Core 102  14  97 % -  08/10/12 1830 - 97.9 F (36.6 C) Core 105  18  96 % -  08/10/12 1815 - 97.9 F (36.6 C) Core 100  16  97 % -  08/10/12 1800 100/61 mmHg 97.7 F (36.5 C) Core 96  20  97 % -  08/10/12 1745 - 97.7 F (36.5 C) Core 100  29  97 % -  08/10/12 1730 - 97.5 F (36.4 C) Core 102  26  97 % -  08/10/12 1715 - 97.3 F (36.3 C) Core 99  23  97 % -  08/10/12 1700 - 97.3 F (36.3 C) Core 103  20  96 % -  08/10/12 1645 - 97.5 F (36.4 C) Core 105  17  91 % -   Pre op weight 96.2 kg Current Weight  08/11/12 227 lb 1.2 oz (103 kg)    Hemodynamic parameters for last 24 hours: PAP: (26-49)/(12-23) 38/13 mmHg CO:  [2.5 L/min-4.8 L/min] 4.8 L/min CI:  [1.2 L/min/m2-2.4 L/min/m2] 2.4 L/min/m2  Intake/Output from previous day: 08/13 0701 - 08/14 0700 In: 6167.5 [I.V.:4188.5; Blood:419; NG/GT:60; IV Piggyback:1500] Out: 3020 [Urine:2530; Emesis/NG output:150; Chest Tube:340]   Physical Exam:  Cardiovascular: RRR;rub with chest tubes in place. Pulmonary: Diminished at bases bilaterally; no rales, wheezes, or rhonchi. Abdomen: Soft, non tender, bowel sounds present. Extremities: Mild bilateral lower extremity  edema.SCD LLE. Wounds: Dressings are clean and dry.    Lab Results: CBC: Basename 08/11/12 0411 08/10/12 2232 08/10/12 2221  WBC 12.2* -- 13.2*  HGB 9.0* 8.8* --  HCT 25.8* 26.0* --  PLT 114* -- 121*   BMET:  Basename 08/11/12 0411 08/10/12 2232 08/10/12 0500  NA 140 139 --  K 4.8 4.6 --  CL 109 107 --  CO2 23 -- 21  GLUCOSE 118* 157* --  BUN 14 13 --  CREATININE 0.85 0.90 --  CALCIUM 7.8* -- 10.0    PT/INR:  Lab Results  Component Value Date   INR 1.80* 08/10/2012   INR 1.05 08/04/2012   ABG:  INR: Will add last result for INR, ABG once components are confirmed Will add last 4 CBG results once components are confirmed  Assessment/Plan:  1. CV - SR.Wean neo synephrine at  and then dopamine as tolerates.Hold Lopressor 12.5 bid. 2.  Pulmonary - Chest tubes with 340 cc. No air leak.Encourage incentive spirometer.CXR this am shows no pneumothorax, low lung volumes, cardiomegaly, some pulmonary vascular congestion, atelectasis at right base, and small bilateral pleural effusions. 3. Volume Overload - Gentle diuresis. 4. expected Acute blood loss anemia - H and H 9 and 25.8 this am. No Blood transfusion so far 5.Mild thrombocytopenia-platelets 114,000. 6.DM-CBGs 171/165/155.Pre op HGA1C 6.1. Will need follow up as outpatient. 7.Remove Swan, a line,  mediastinal chest tube.  ZIMMERMAN,DONIELLE MPA-C 08/11/2012  Patient has had asa intolerance, but no allergy will use low dose asa with tissue valve  I have seen and examined Miranda Hutchinson and agree with the above assessment  and plan.  Delight Ovens MD Beeper [phone] Office [phone] 08/11/2012 8:22 AM

## 2012-08-11 NOTE — Progress Notes (Signed)
Compression wrap removed.  3 vein harvest sites noted.   Small site most distal and site lateral to knee both very oozy, but approximated; gauze and tape dressings applied.  Site in right femoral area not approximated.  Cleansed with sterile solution and 3 steri strips applied, then covered with gauze.  Will alert MD in AM rounds.  Will continue to monitor.

## 2012-08-11 NOTE — Op Note (Signed)
NAME:  Miranda Hutchinson, Miranda Hutchinson NO.:  [id number]  MEDICAL RECORD NO.:  [id number]  LOCATION:                                 FACILITY:  PHYSICIAN:  Bedelia Person, M.D.        DATE OF BIRTH:  01/19/1946  DATE OF PROCEDURE:  08/10/2012 DATE OF DISCHARGE:                              OPERATIVE REPORT   Intraoperative Transesophageal Echocardiography Report  Miranda Hutchinson is scheduled for coronary artery bypass grafting as well as possible aortic valve replacement and ascending aortic aneurysm repair. She has no history of esophageal or gastric pathology.  A 3D transesophageal probe will be used intraoperatively to assess the extent of the aneurysm as well as the successful repair and left ventricular function after coronary artery bypass grafting.  After induction with general anesthesia, the airway was secured with an oral endotracheal tube, oral gastric tube was placed to drain gastric secretions.  The tube was then removed and the transesophageal probe was heavily lubricated, placed blindly into the oropharynx with no resistance.  It remained at the approximate 40 cm mark throughout the case.  It was in the neutral and flexed position during the bypass.  At the completion of the case, the probe was removed.  There was no evidence of oral or pharyngeal damage.  Prebypass examination revealed left ventricle to be concentrically thickened.  There were no segmental defects.  Mitral valve, 2 leaflets opening and closing appropriately, no masses or vegetations were noted, and color Doppler revealed no mitral insufficiency.  The aorta at the left atrium was normal in size and appearance, the inner atrial septum was intact, the appendage was clean.  Tricuspid valve, 2 leaflets opened and closed appropriately, there was trace tricuspid regurgitation on color Doppler with the Swan-Ganz catheter across the valve, and right- sided contractility appeared normal.  The aortic valve had 3  leaflets, all opening and closing appropriately, no calcification was noted, the valve measured 3 cm in diameter, and the color Doppler revealed moderate aortic insufficiency.  The ascending aorta appeared dilated for a short distance, maximum measurement was 4.5 cm.  The descending aorta appeared normal.  The patient was placed on cardiopulmonary bypass and underwent aortic valve replacement and conduit to the aorta to the pathologic portion of the aorta as well as coronary artery bypass grafting.  At the completion of the procedure, there were numerous air bubbles, which were cleared with a left ventricular vent.  The postbypass examination revealed the left ventricle to be somewhat sluggish especially in the anterolateral regions, this improved with a little inotropic dopamine support and time after bypass.  By the completion of the procedure and the patient leaving the room, left ventricular function was somewhat dynamic with no noted segmental defects.  The mitral valve was unchanged in appearance from the prebypass examination.  There was trace central mitral regurgitation. Interatrial septum was intact.  The prosthetic aortic valve was difficult to visualize; however, leaflets could be noted to be all opening and closing appropriately.  Color Doppler revealed no aortic insufficiency. The aorta repair could not be visualized, but no perivalvular leaks around the aortic valve were seen.  The right heart examination  was essentially unchanged from the prebypass examination.          ______________________________ Bedelia Person, M.D.     LK/MEDQ  D:  08/10/2012  T:  08/11/2012  Job:  [id number]

## 2012-08-11 NOTE — Progress Notes (Signed)
Patient ID: Miranda Hutchinson, female   DOB: May 03, 1946, 66 y.o.   MRN: [id number]                   [address]            [place] [address]          [phone]     1 Day Post-Op Procedure(s) (LRB): CORONARY ARTERY BYPASS GRAFTING (CABG) (N/A) THORACIC ASCENDING ANEURYSM REPAIR (AAA) (N/A) AORTIC VALVE REPLACEMENT (AVR) (N/A)  Total Length of Stay:  LOS: 7 days  BP 106/42  Pulse 88  Temp 98.6 F (37 C) (Oral)  Resp 33  Ht 5\' 6"  (1.676 m)  Wt 227 lb 1.2 oz (103 kg)  BMI 36.65 kg/m2  SpO2 95%     . sodium chloride    . sodium chloride 20 mL (08/10/12 1645)  . sodium chloride    . dexmedetomidine Stopped (08/10/12 2315)  . DOPamine 5 mcg/kg/min (08/10/12 2100)  . insulin (NOVOLIN-R) infusion 4.2 mL/hr at 08/11/12 0600  . lactated ringers 20 mL (08/10/12 1645)  . nitroGLYCERIN    . phenylephrine (NEO-SYNEPHRINE) Adult infusion Stopped (08/11/12 1000)     Lab Results  Component Value Date   WBC 11.4* 08/11/2012   HGB 8.5* 08/11/2012   HCT 25.0* 08/11/2012   PLT 77* 08/11/2012   GLUCOSE 165* 08/11/2012   ALT 61* 08/10/2012   AST 51* 08/10/2012   NA 135 08/11/2012   K 4.5 08/11/2012   CL 106 08/11/2012   CREATININE 0.88 08/11/2012   BUN 13 08/11/2012   CO2 22 08/11/2012   TSH 0.288* 08/04/2012   INR 1.80* 08/10/2012   HGBA1C 6.1* 08/10/2012  still on dopamine, weaning off     Delight Ovens MD  Beeper [phone] Office [phone] 08/11/2012 8:25 PM

## 2012-08-11 NOTE — Care Management Note (Unsigned)
    Page 1 of 1   08/11/2012     3:57:51 PM   CARE MANAGEMENT NOTE 08/11/2012  Patient:  Miranda Hutchinson, Miranda Hutchinson   Account Number:  [id number]  Date Initiated:  08/09/2012  Documentation initiated by:  Tera Mater  Subjective/Objective Assessment:   66yo female admitted with Chest Pain.     Action/Plan:   Discharge planning   Anticipated DC Date:  08/14/2012   Anticipated DC Plan:  HOME W HOME HEALTH SERVICES      DC Planning Services  CM consult      Choice offered to / List presented to:             Status of service:  In process, will continue to follow Medicare Important Message given?   (If response is "NO", the following Medicare IM given date fields will be blank) Date Medicare IM given:   Date Additional Medicare IM given:    Discharge Disposition:    Per UR Regulation:  Reviewed for med. necessity/level of care/duration of stay  If discussed at Long Length of Stay Meetings, dates discussed:    Comments:  08/11/12 Marleni Gallardo,RN,BSN [phone] PT S/P CABG X 3, AVR AND TAA ON 08/10/12.  PTA, PT INDEPENDENT, LIVES ALONE.  PT STATES THAT HER AUNT WILL PROVIDE 24HR CARE AT DISCHARGE.  WILL FOLLOW FOR HOME NEEDS AS PT PROGRESSES.   08/09/12 1440 Tera Mater, RN, BSN NCM [phone] Pt. underwent cardiac catheterization on 8/7 which revealed 3V CAD requiring CABG.  Scheduled for CABG 8/13.

## 2012-08-11 NOTE — Progress Notes (Signed)
INITIAL ADULT NUTRITION ASSESSMENT Date: 08/11/2012   Time: 3:29 PM  INTERVENTION:  Resource Breeze supplement daily (250 kcals, 9 gm protein per 8 fl oz carton) RD to follow for nutrition care plan  Reason for Assessment: Low Braden, poor PO intake  ASSESSMENT: Female 66 y.o.  Dx: CAD, aortic root aneurysm, aortic insufficiency    Hx:  Past Medical History  Diagnosis Date  . Dyslipidemia   . Aortic insufficiency   . Hyperthyroidism   . Heart murmur   . Exertional dyspnea   . Hypothyroidism     Related Meds:     . acetaminophen (TYLENOL) oral liquid 160 mg/5 mL  650 mg Per Tube NOW   Or  . acetaminophen  650 mg Rectal NOW  . acetaminophen  1,000 mg Oral Q6H   Or  . acetaminophen (TYLENOL) oral liquid 160 mg/5 mL  975 mg Per Tube Q6H  . aspirin EC  325 mg Oral Daily   Or  . aspirin  324 mg Per Tube Daily  . atorvastatin  40 mg Oral q1800  . bisacodyl  10 mg Oral Daily   Or  . bisacodyl  10 mg Rectal Daily  . cefUROXime (ZINACEF)  IV  1.5 g Intravenous Q12H  . docusate sodium  200 mg Oral Daily  . famotidine (PEPCID) IV  20 mg Intravenous Q12H  . furosemide  20 mg Intravenous Once  . insulin aspart  0-24 Units Subcutaneous Q4H  . insulin regular  0-10 Units Intravenous TID WC  . levalbuterol  0.63 mg Nebulization Q6H  . magnesium sulfate  4 g Intravenous Once  . metoprolol tartrate  12.5 mg Oral BID   Or  . metoprolol tartrate  12.5 mg Per Tube BID  . pantoprazole  40 mg Oral Q1200  . potassium chloride  10 mEq Intravenous Q1 Hr x 3  . sodium chloride  3 mL Intravenous Q12H  . tranexamic acid (CYKLOKAPRON) infusion ([facility])  1.5 mg/kg/hr Intravenous To OR  . vancomycin  1,000 mg Intravenous Once  . DISCONTD: amLODipine  10 mg Oral Daily  . DISCONTD: aspirin EC  81 mg Oral Daily  . DISCONTD: dexmedetomidine  0.4-1.2 mcg/kg/hr Intravenous To OR  . DISCONTD: enoxaparin (LOVENOX) injection  100 mg Subcutaneous Q12H  . DISCONTD: levalbuterol  0.63 mg Nebulization  Q6H  . DISCONTD: lisinopril  20 mg Oral Daily  . DISCONTD: metoprolol tartrate  12.5 mg Per Tube BID  . DISCONTD: metoprolol tartrate  12.5 mg Oral BID  . DISCONTD: metoprolol tartrate  25 mg Oral BID    Ht: 5\' 6"  (167.6 cm)  Wt: 227 lb 1.2 oz (103 kg)  Ideal Wt: 59 kg % Ideal Wt: 175%  Usual Wt: --- % Usual Wt: ---  Body mass index is 36.65 kg/(m^2).  Food/Nutrition Related Hx: no triggers per admission nutrition screen  Labs:  CMP     Component Value Date/Time   NA 138 08/11/2012 0900   K 4.9 08/11/2012 0900   CL 106 08/11/2012 0900   CO2 22 08/11/2012 0900   GLUCOSE 176* 08/11/2012 0900   BUN 14 08/11/2012 0900   CREATININE 0.84 08/11/2012 0900   CALCIUM 7.9* 08/11/2012 0900   PROT 7.6 08/10/2012 0500   ALBUMIN 3.7 08/10/2012 0500   AST 51* 08/10/2012 0500   ALT 61* 08/10/2012 0500   ALKPHOS 69 08/10/2012 0500   BILITOT 0.4 08/10/2012 0500   GFRNONAA 70* 08/11/2012 0900   GFRAA 81* 08/11/2012 0900  Intake/Output Summary (Last 24 hours) at 08/11/12 1530 Last data filed at 08/11/12 1400  Gross per 24 hour  Intake 3792.55 ml  Output   3995 ml  Net -202.45 ml    CBG (last 3)   Basename 08/11/12 1152 08/11/12 0820 08/11/12 0729  GLUCAP 133* 104* 116*    Diet Order: Clear Liquid  Supplements/Tube Feeding: N/A  IVF:    sodium chloride   sodium chloride Last Rate: 20 mL (08/10/12 1645)  sodium chloride   dexmedetomidine Last Rate: Stopped (08/10/12 2315)  DOPamine Last Rate: 5 mcg/kg/min (08/10/12 2100)  insulin (NOVOLIN-R) infusion Last Rate: 4.2 mL/hr at 08/11/12 0600  lactated ringers Last Rate: 20 mL (08/10/12 1645)  nitroGLYCERIN   phenylephrine (NEO-SYNEPHRINE) Adult infusion Last Rate: Stopped (08/11/12 1000)    Estimated Nutritional Needs:   Kcal: 2000-2200 Protein: 100-110 gm Fluid: 2.0-2.2 L  Patient s/p CABG, thoracic AAA repair and aortic valve replacement 8/13; extubated at 0318 AM; mild bilateral lower extremity edema; noted patient with low  braden score 8/13; states her appetite is rather poor and that she "doesn't want anything;" reports eating well prior to admission; she is amenable to trying Resource Breeze supplement to help meet kcal, protein needs for healing -- RD to order.  NUTRITION DIAGNOSIS: -Increased nutrient needs (NI-5.1).  Status: Ongoing  RELATED TO: post-op healing  AS EVIDENCE BY: estimated nutrition needs  MONITORING/EVALUATION(Goals): Goal: Oral intake with meals & supplements to meet >/= 90% of estimated nutrition needs Monitor: PO & supplemental intake, weight, labs, I/O's  EDUCATION NEEDS: -No education needs identified at this time  Dietitian #: [phone]  DOCUMENTATION CODES Per approved criteria  -Obesity Unspecified    Alger Memos 08/11/2012, 3:29 PM

## 2012-08-12 ENCOUNTER — Inpatient Hospital Stay (HOSPITAL_COMMUNITY): Payer: Medicare Other

## 2012-08-12 LAB — CBC
HCT: 21.6 % — ABNORMAL LOW (ref 36.0–46.0)
Hemoglobin: 7.3 g/dL — ABNORMAL LOW (ref 12.0–15.0)
MCH: 29.3 pg (ref 26.0–34.0)
MCHC: 33.8 g/dL (ref 30.0–36.0)
MCV: 86.7 fL (ref 78.0–100.0)
RDW: 14 % (ref 11.5–15.5)

## 2012-08-12 LAB — GLUCOSE, CAPILLARY
Glucose-Capillary: 106 mg/dL — ABNORMAL HIGH (ref 70–99)
Glucose-Capillary: 109 mg/dL — ABNORMAL HIGH (ref 70–99)
Glucose-Capillary: 113 mg/dL — ABNORMAL HIGH (ref 70–99)
Glucose-Capillary: 113 mg/dL — ABNORMAL HIGH (ref 70–99)
Glucose-Capillary: 117 mg/dL — ABNORMAL HIGH (ref 70–99)
Glucose-Capillary: 135 mg/dL — ABNORMAL HIGH (ref 70–99)
Glucose-Capillary: 135 mg/dL — ABNORMAL HIGH (ref 70–99)
Glucose-Capillary: 90 mg/dL (ref 70–99)
Glucose-Capillary: 99 mg/dL (ref 70–99)

## 2012-08-12 LAB — BASIC METABOLIC PANEL
BUN: 18 mg/dL (ref 6–23)
CO2: 23 mEq/L (ref 19–32)
Chloride: 104 mEq/L (ref 96–112)
Creatinine, Ser: 1.03 mg/dL (ref 0.50–1.10)
Glucose, Bld: 139 mg/dL — ABNORMAL HIGH (ref 70–99)
Potassium: 4.4 mEq/L (ref 3.5–5.1)

## 2012-08-12 MED ORDER — ASPIRIN EC 81 MG PO TBEC
81.0000 mg | DELAYED_RELEASE_TABLET | Freq: Every day | ORAL | Status: DC
  Administered 2012-08-12 – 2012-08-14 (×3): 81 mg via ORAL
  Filled 2012-08-12 (×3): qty 1

## 2012-08-12 MED ORDER — ASPIRIN 81 MG PO CHEW: 324.0000 mg | CHEWABLE_TABLET | Freq: Every day | ORAL | Status: DC

## 2012-08-12 MED ORDER — LEVALBUTEROL HCL 0.63 MG/3ML IN NEBU
0.6300 mg | INHALATION_SOLUTION | Freq: Four times a day (QID) | RESPIRATORY_TRACT | Status: DC | PRN
  Administered 2012-08-13: 0.63 mg via RESPIRATORY_TRACT
  Filled 2012-08-12 (×2): qty 3

## 2012-08-12 MED ORDER — FUROSEMIDE 10 MG/ML IJ SOLN
40.0000 mg | Freq: Once | INTRAMUSCULAR | Status: AC
  Administered 2012-08-12: 40 mg via INTRAVENOUS
  Filled 2012-08-12: qty 4

## 2012-08-12 MED FILL — Sodium Bicarbonate IV Soln 8.4%: INTRAVENOUS | Qty: 50 | Status: AC

## 2012-08-12 MED FILL — Mannitol IV Soln 20%: INTRAVENOUS | Qty: 500 | Status: AC

## 2012-08-12 MED FILL — Electrolyte-R (PH 7.4) Solution: INTRAVENOUS | Qty: 5000 | Status: AC

## 2012-08-12 MED FILL — Sodium Chloride IV Soln 0.9%: INTRAVENOUS | Qty: 1000 | Status: AC

## 2012-08-12 MED FILL — Heparin Sodium (Porcine) Inj 1000 Unit/ML: INTRAMUSCULAR | Qty: 30 | Status: AC

## 2012-08-12 MED FILL — Potassium Chloride Inj 2 mEq/ML: INTRAVENOUS | Qty: 40 | Status: AC

## 2012-08-12 MED FILL — Heparin Sodium (Porcine) Inj 1000 Unit/ML: INTRAMUSCULAR | Qty: 4 | Status: AC

## 2012-08-12 MED FILL — Sodium Chloride Irrigation Soln 0.9%: Qty: 3000 | Status: AC

## 2012-08-12 MED FILL — Lidocaine HCl IV Inj 20 MG/ML: INTRAVENOUS | Qty: 5 | Status: AC

## 2012-08-12 MED FILL — Magnesium Sulfate Inj 50%: INTRAMUSCULAR | Qty: 2 | Status: AC

## 2012-08-12 NOTE — Progress Notes (Addendum)
Patient ID: Miranda Hutchinson, female   DOB: Apr 13, 1946, 66 y.o.   MRN: [id number] TCTS DAILY PROGRESS NOTE                   [address]            [place] [address]          [phone]      2 Days Post-Op Procedure(s) (LRB): CORONARY ARTERY BYPASS GRAFTING (CABG) (N/A) THORACIC ASCENDING ANEURYSM REPAIR (AAA) (N/A) AORTIC VALVE REPLACEMENT (AVR) (N/A)  Total Length of Stay:  LOS: 8 days   Subjective: Feels better today, clearer with out confusion this am  Objective: Vital signs in last 24 hours: Temp:  [98.2 F (36.8 C)-98.6 F (37 C)] 98.2 F (36.8 C) (08/15 0740) Pulse Rate:  [31-100] 78  (08/15 0800) Cardiac Rhythm:  [-] Normal sinus rhythm;Bundle branch block (08/15 0800) Resp:  [16-36] 17  (08/15 0800) BP: (79-116)/(33-73) 108/43 mmHg (08/15 0800) SpO2:  [79 %-98 %] 98 % (08/15 0825) Arterial Line BP: (79-142)/(33-59) 113/46 mmHg (08/15 0800) Weight:  [230 lb 6.1 oz (104.5 kg)] 230 lb 6.1 oz (104.5 kg) (08/15 0500)  Filed Weights   08/10/12 0444 08/11/12 0500 08/12/12 0500  Weight: 212 lb 4.9 oz (96.3 kg) 227 lb 1.2 oz (103 kg) 230 lb 6.1 oz (104.5 kg)    Weight change: 3 lb 4.9 oz (1.5 kg)   Hemodynamic parameters for last 24 hours:    Intake/Output from previous day: 08/14 0701 - 08/15 0700 In: 1699.9 [P.O.:960; I.V.:637.9; IV Piggyback:102] Out: 2130 [Urine:1925; Chest Tube:205]  Intake/Output this shift: Total I/O In: 20 [I.V.:20] Out: 20 [Urine:20]  Current Meds: Scheduled Meds:   . acetaminophen  1,000 mg Oral Q6H   Or  . acetaminophen (TYLENOL) oral liquid 160 mg/5 mL  975 mg Per Tube Q6H  . aspirin EC  325 mg Oral Daily   Or  . aspirin  324 mg Per Tube Daily  . atorvastatin  40 mg Oral q1800  . bisacodyl  10 mg Oral Daily   Or  . bisacodyl  10 mg Rectal Daily  . cefUROXime (ZINACEF)  IV  1.5 g Intravenous Q12H  . docusate sodium  200 mg Oral Daily  . feeding supplement  1 Container Oral Q1500  . insulin aspart  0-24  Units Subcutaneous Q4H  . insulin regular  0-10 Units Intravenous TID WC  . levalbuterol  0.63 mg Nebulization Q6H  . metoprolol tartrate  12.5 mg Oral BID   Or  . metoprolol tartrate  12.5 mg Per Tube BID  . pantoprazole  40 mg Oral Q1200  . sodium chloride  3 mL Intravenous Q12H   Continuous Infusions:   . sodium chloride    . sodium chloride 20 mL (08/10/12 1645)  . sodium chloride    . dexmedetomidine Stopped (08/10/12 2315)  . insulin (NOVOLIN-R) infusion 4.2 mL/hr at 08/11/12 0600  . lactated ringers 20 mL (08/10/12 1645)  . nitroGLYCERIN    . phenylephrine (NEO-SYNEPHRINE) Adult infusion Stopped (08/11/12 1000)  . DISCONTD: DOPamine 1 mcg/kg/min (08/11/12 2300)   PRN Meds:.albumin human, metoprolol, midazolam, morphine injection, ondansetron (ZOFRAN) IV, oxyCODONE, sodium chloride  General appearance: alert and cooperative Neurologic: intact Heart: regular rate and rhythm, S1, S2 normal, no murmur, click, rub or gallop and normal apical impulse Lungs: clear to auscultation bilaterally and normal percussion bilaterally Abdomen: soft, non-tender; bowel sounds normal; no masses,  no organomegaly Extremities: extremities normal, atraumatic, no  cyanosis or edema and Homans sign is negative, no sign of DVT Wound: sternum stable  Lab Results: CBC: Basename 08/12/12 0420 08/11/12 1645  WBC 11.6* 11.4*  HGB 7.3* 8.5*  HCT 21.6* 25.0*  PLT 66* 77*   BMET:  Basename 08/12/12 0420 08/11/12 1645 08/11/12 1643 08/11/12 0900  NA 135 -- 135 --  K 4.4 -- 4.5 --  CL 104 -- 106 --  CO2 23 -- -- 22  GLUCOSE 139* -- 165* --  BUN 18 -- 13 --  CREATININE 1.03 0.88 -- --  CALCIUM 8.1* -- -- 7.9*    PT/INR:  Basename 08/10/12 1630  LABPROT 21.2*  INR 1.80*   Radiology: Dg Chest Port 1 View  08/12/2012  *RADIOLOGY REPORT*  Clinical Data: Aortic aneurysm  PORTABLE CHEST - 1 VIEW  Comparison: Yesterday  Findings: Swan-Ganz catheter removed with the introducer left in place.  Stable  left chest tube without pneumothorax.  Low volumes. Bibasilar opacities stable.  Cardiac silhouette remains prominent likely due to low volumes. Stable mild vascular congestion.  IMPRESSION: No pneumothorax.  No significant change in bibasilar atelectasis. Stable mild vascular congestion.  Original Report Authenticated By: Donavan Burnet, M.D.   Dg Chest Portable 1 View In Am  08/11/2012  *RADIOLOGY REPORT*  Clinical Data: CABG.  Aneurysm repair.  PORTABLE CHEST - 1 VIEW  Comparison: 08/10/2012.  Findings: Endotracheal tube and nasogastric tube have been removed. Right IJ Swan-Ganz catheter tip projects over the right pulmonary artery.  Mediastinal drains and left chest tube are in place. Lungs are low in volume with mild residual interstitial prominence and bibasilar air space disease.  Overall aeration has improved somewhat from 08/10/2012.  No pneumothorax.  No definite pleural fluid.  IMPRESSION: Low lung volumes with mild diffuse interstitial prominence and bibasilar air space disease, favoring improving edema with bibasilar atelectasis.  Original Report Authenticated By: Reyes Ivan, M.D.   Dg Chest Portable 1 View  08/10/2012  *RADIOLOGY REPORT*  Clinical Data: Post coronary bypass grafting  PORTABLE CHEST - 1 VIEW  Comparison: Earlier film of the same day  Findings: Changes of interval CABG.  Endotracheal tube has been placed, tip approximately 3 cm above carina.  Nasogastric tube extends to the stomach.  Left chest tube without pneumothorax. Right IJ Swan-Ganz extends to the right lower lobe branch of the pulmonary artery.  Mediastinal drain is in place.  Low lung volumes with moderate patchy perihilar and bibasilar atelectasis/consolidation, left greater than right.  Possible layering left pleural effusion.  Stable cardiomegaly.  IMPRESSION:  1.  Interval CABG with support hardware in expected location. 2.  No pneumothorax. 3.  Low volumes with bilateral infiltrates/atelectasis/edema as above.   Original Report Authenticated By: Osa Craver, M.D.     Assessment/Plan: S/P Procedure(s) (LRB): CORONARY ARTERY BYPASS GRAFTING (CABG) (N/A) THORACIC ASCENDING ANEURYSM REPAIR (AAA) (N/A) AORTIC VALVE REPLACEMENT (AVR) (N/A) Mobilize Diuresis d/c tubes/lines Expected Acute  Blood - loss Anemia, may need blood if drops lower so far has not had transfusion D/c foley and chest tube Thrombocytopenia, avoid heparin for now   Delight Ovens MD  Beeper [phone] Office [phone] 08/12/2012 8:43 AM

## 2012-08-13 ENCOUNTER — Inpatient Hospital Stay (HOSPITAL_COMMUNITY): Payer: Medicare Other

## 2012-08-13 LAB — TYPE AND SCREEN
ABO/RH(D): A NEG
Antibody Screen: POSITIVE
DAT, IgG: NEGATIVE
Donor AG Type: NEGATIVE
Donor AG Type: NEGATIVE
Donor AG Type: NEGATIVE
Donor AG Type: NEGATIVE
PT AG Type: NEGATIVE
Unit division: 0
Unit division: 0
Unit division: 0
Unit division: 0
Unit division: 0

## 2012-08-13 LAB — BASIC METABOLIC PANEL
BUN: 22 mg/dL (ref 6–23)
CO2: 26 mEq/L (ref 19–32)
Calcium: 8.2 mg/dL — ABNORMAL LOW (ref 8.4–10.5)
Chloride: 103 mEq/L (ref 96–112)
Creatinine, Ser: 1.03 mg/dL (ref 0.50–1.10)
GFR calc Af Amer: 63 mL/min — ABNORMAL LOW (ref >90)
GFR calc non Af Amer: 55 mL/min — ABNORMAL LOW (ref >90)
Glucose, Bld: 133 mg/dL — ABNORMAL HIGH (ref 70–99)
Potassium: 4.4 mEq/L (ref 3.5–5.1)
Sodium: 135 mEq/L (ref 135–145)

## 2012-08-13 LAB — CBC
HCT: 20.3 % — ABNORMAL LOW (ref 36.0–46.0)
HCT: 24.2 % — ABNORMAL LOW (ref 36.0–46.0)
Hemoglobin: 6.9 g/dL — CL (ref 12.0–15.0)
Hemoglobin: 8.2 g/dL — ABNORMAL LOW (ref 12.0–15.0)
MCH: 29.9 pg (ref 26.0–34.0)
MCH: 29 pg (ref 26.0–34.0)
MCHC: 34 g/dL (ref 30.0–36.0)
MCHC: 33.9 g/dL (ref 30.0–36.0)
MCV: 87.9 fL (ref 78.0–100.0)
MCV: 85.5 fL (ref 78.0–100.0)
Platelets: 75 10*3/uL — ABNORMAL LOW (ref 150–400)
Platelets: 105 10*3/uL — ABNORMAL LOW (ref 150–400)
RBC: 2.31 MIL/uL — ABNORMAL LOW (ref 3.87–5.11)
RBC: 2.83 MIL/uL — ABNORMAL LOW (ref 3.87–5.11)
RDW: 14.1 % (ref 11.5–15.5)
RDW: 13.8 % (ref 11.5–15.5)
WBC: 9.9 10*3/uL (ref 4.0–10.5)
WBC: 11.9 10*3/uL — ABNORMAL HIGH (ref 4.0–10.5)

## 2012-08-13 LAB — GLUCOSE, CAPILLARY
Glucose-Capillary: 109 mg/dL — ABNORMAL HIGH (ref 70–99)
Glucose-Capillary: 107 mg/dL — ABNORMAL HIGH (ref 70–99)
Glucose-Capillary: 159 mg/dL — ABNORMAL HIGH (ref 70–99)
Glucose-Capillary: 115 mg/dL — ABNORMAL HIGH (ref 70–99)

## 2012-08-13 LAB — PREPARE RBC (CROSSMATCH)

## 2012-08-13 MED ORDER — NYSTATIN 100000 UNIT/GM EX POWD
Freq: Two times a day (BID) | CUTANEOUS | Status: DC
  Administered 2012-08-14 – 2012-08-16 (×4): via TOPICAL
  Filled 2012-08-13: qty 15

## 2012-08-13 MED ORDER — FUROSEMIDE 10 MG/ML IJ SOLN
40.0000 mg | Freq: Once | INTRAMUSCULAR | Status: AC
  Administered 2012-08-13: 40 mg via INTRAVENOUS
  Filled 2012-08-13: qty 4

## 2012-08-13 MED ORDER — INSULIN ASPART 100 UNIT/ML ~~LOC~~ SOLN: 0.0000 [IU] | Freq: Three times a day (TID) | SUBCUTANEOUS | Status: DC

## 2012-08-13 NOTE — Progress Notes (Signed)
Pt ambulated 300 feet on 4L [place].  Became SOB halfway through; sat fr ~2 minutes, then oxygen saturation was 100% and pt ambulated at a fair pace back to room.  Positioned comfortably in chair.  Pt states that she feels more tired than yesterday.  Will continue to monitor.

## 2012-08-13 NOTE — Progress Notes (Signed)
Itchy rash noted on right upper leg.  Area cleaned thoroughly with soap and water.  Antifungal powder applied to area.  Pt states itching is relieved.  Will continue to monitor.

## 2012-08-13 NOTE — Evaluation (Signed)
Physical Therapy Evaluation Patient Details Name: Miranda Hutchinson MRN: [id number] DOB: 01/15/1946 Today's Date: 08/13/2012 Time: 4098-1191 PT Time Calculation (min): 22 min  PT Assessment / Plan / Recommendation Clinical Impression  Pt underwent CABG. Pt weak and lightheaded with amb.  Pt for blood transfusion today.  Pt should make good progress and be able to return home with aunt's assistance.    PT Assessment  Patient needs continued PT services    Follow Up Recommendations  Home health PT    Barriers to Discharge        Equipment Recommendations  Rolling walker with 5" wheels    Recommendations for Other Services     Frequency Min 3X/week    Precautions / Restrictions Precautions Precautions: Sternal   Pertinent Vitals/Pain SaO2 decr to 88% on RA.  Replaced O2 at 1L for amb with Sao2 in 90's.      Mobility  Bed Mobility Bed Mobility: Supine to Sit;Sitting - Scoot to Edge of Bed Supine to Sit: 4: Min assist;HOB elevated Sitting - Scoot to [employer] of Bed: 4: Min assist Details for Bed Mobility Assistance: assist to bring trunk up Transfers Transfers: Sit to Stand;Stand to [employer] Transfers Sit to Stand: 4: Min assist;Without upper extremity assist;From bed;From chair/3-in-1 Stand to Sit: 4: Min assist;Without upper extremity assist;To chair/3-in-1 Stand Pivot Transfers: 4: Min assist Details for Transfer Assistance: slight assist to raise hips Ambulation/Gait Ambulation/Gait Assistance: 4: Min guard Ambulation Distance (Feet): 40 Feet Assistive device: Other (Comment) (pushing w/c) Ambulation/Gait Assistance Details: Pt felt weak and tired with amb.  Brought chair to pt. Gait Pattern: Step-through pattern;Decreased stride length Gait velocity: slow cadence    Exercises     PT Diagnosis: Difficulty walking;Generalized weakness  PT Problem List: Decreased activity tolerance;Decreased strength;Decreased mobility;Decreased balance;Decreased knowledge of  precautions PT Treatment Interventions: DME instruction;Gait training;Functional mobility training;Therapeutic activities;Therapeutic exercise;Balance training;Patient/family education   PT Goals Acute Rehab PT Goals PT Goal Formulation: With patient Time For Goal Achievement: 08/20/12 Potential to Achieve Goals: Good Pt will go Supine/Side to Sit: with modified independence PT Goal: Supine/Side to Sit - Progress: Goal set today Pt will go Sit to Supine/Side: with modified independence PT Goal: Sit to Supine/Side - Progress: Goal set today Pt will go Sit to Stand: with modified independence PT Goal: Sit to Stand - Progress: Goal set today Pt will go Stand to Sit: with modified independence PT Goal: Stand to Sit - Progress: Goal set today Pt will Ambulate: >150 feet;with modified independence;with least restrictive assistive device PT Goal: Ambulate - Progress: Goal set today  Visit Information  Last PT Received On: 08/13/12 Assistance Needed: +1    Subjective Data  Subjective: "I don't know why I am so tired today." Patient Stated Goal: Return to prior level of function   Prior Functioning  Home Living Lives With: Alone Available Help at Discharge: Family;Available 24 hours/day (aunt is going to stay with her) Type of Home: House Home Access: Stairs to enter [employer] of Steps: 1 Entrance Stairs-Rails: None Home Layout: One level Bathroom Shower/Tub: Network engineer: None Prior Function Level of Independence: Independent Able to Take Stairs?: Yes Driving: Yes Vocation: Retired Musician: No difficulties    Cognition  Overall Cognitive Status: Appears within functional limits for tasks assessed/performed Arousal/Alertness: Awake/alert Orientation Level: Appears intact for tasks assessed Behavior During Session: [facility] for tasks performed    Extremity/Trunk Assessment Right Lower Extremity  Assessment RLE ROM/Strength/Tone: Deficits RLE ROM/Strength/Tone Deficits:  grossly 4/5 Left Lower Extremity Assessment LLE ROM/Strength/Tone: Deficits LLE ROM/Strength/Tone Deficits: grossly 4/5   Balance Static Standing Balance Static Standing - Balance Support: Bilateral upper extremity supported Static Standing - Level of Assistance: 5: Stand by assistance  End of Session PT - End of Session Equipment Utilized During Treatment: Oxygen Activity Tolerance: Patient limited by fatigue Patient left: in chair;with call bell/phone within reach;with nursing in room Nurse Communication: Mobility status  GP     [facility] 08/13/2012, 12:36 PM  [facility] PT [phone]

## 2012-08-13 NOTE — Progress Notes (Signed)
Patient ID: Miranda Hutchinson, female   DOB: 1946-07-19, 66 y.o.   MRN: [id number] TCTS DAILY PROGRESS NOTE                   [address]            [place] [address]          [phone]      3 Days Post-Op Procedure(s) (LRB): CORONARY ARTERY BYPASS GRAFTING (CABG) (N/A) THORACIC ASCENDING ANEURYSM REPAIR (AAA) (N/A) AORTIC VALVE REPLACEMENT (AVR) (N/A)  Total Length of Stay:  LOS: 9 days   Subjective: Feels weak but walked 300 feet this am  Objective: Vital signs in last 24 hours: Temp:  [97.9 F (36.6 C)-99 F (37.2 C)] 97.9 F (36.6 C) (08/16 0720) Pulse Rate:  [78-96] 92  (08/16 0700) Cardiac Rhythm:  [-] Heart block;Bundle branch block (08/16 0600) Resp:  [17-34] 19  (08/16 0700) BP: (80-134)/(37-104) 102/50 mmHg (08/16 0700) SpO2:  [84 %-99 %] 93 % (08/16 0700) Arterial Line BP: (113-149)/(46-53) 149/53 mmHg (08/15 1000) Weight:  [229 lb 8 oz (104.1 kg)] 229 lb 8 oz (104.1 kg) (08/16 0500)  Filed Weights   08/11/12 0500 08/12/12 0500 08/13/12 0500  Weight: 227 lb 1.2 oz (103 kg) 230 lb 6.1 oz (104.5 kg) 229 lb 8 oz (104.1 kg)    Weight change: -14.1 oz (-0.4 kg)   Hemodynamic parameters for last 24 hours:    Intake/Output from previous day: 08/15 0701 - 08/16 0700 In: 1260 [P.O.:800; I.V.:460] Out: 1720 [Urine:1720]  Intake/Output this shift:    Current Meds: Scheduled Meds:   . acetaminophen  1,000 mg Oral Q6H   Or  . acetaminophen (TYLENOL) oral liquid 160 mg/5 mL  975 mg Per Tube Q6H  . aspirin EC  81 mg Oral Daily   Or  . aspirin  324 mg Per Tube Daily  . atorvastatin  40 mg Oral q1800  . bisacodyl  10 mg Oral Daily   Or  . bisacodyl  10 mg Rectal Daily  . cefUROXime (ZINACEF)  IV  1.5 g Intravenous Q12H  . docusate sodium  200 mg Oral Daily  . feeding supplement  1 Container Oral Q1500  . furosemide  40 mg Intravenous Once  . furosemide  40 mg Intravenous Once  . insulin aspart  0-24 Units Subcutaneous Q4H  . insulin  regular  0-10 Units Intravenous TID WC  . metoprolol tartrate  12.5 mg Oral BID   Or  . metoprolol tartrate  12.5 mg Per Tube BID  . pantoprazole  40 mg Oral Q1200  . sodium chloride  3 mL Intravenous Q12H  . DISCONTD: aspirin  324 mg Per Tube Daily  . DISCONTD: aspirin EC  325 mg Oral Daily  . DISCONTD: levalbuterol  0.63 mg Nebulization Q6H   Continuous Infusions:   . sodium chloride    . sodium chloride 20 mL (08/10/12 1645)  . sodium chloride    . dexmedetomidine Stopped (08/10/12 2315)  . insulin (NOVOLIN-R) infusion 4.2 mL/hr at 08/11/12 0600  . lactated ringers 20 mL (08/10/12 1645)  . nitroGLYCERIN    . phenylephrine (NEO-SYNEPHRINE) Adult infusion Stopped (08/11/12 1000)   PRN Meds:.levalbuterol, metoprolol, midazolam, morphine injection, ondansetron (ZOFRAN) IV, oxyCODONE, sodium chloride  General appearance: alert and cooperative Neurologic: intact Heart: regular rate and rhythm, S1, S2 normal, no murmur, click, rub or gallop and normal apical impulse Lungs: diminished breath sounds bibasilar Abdomen: soft, non-tender; bowel sounds normal;  no masses,  no organomegaly Extremities: extremities normal, atraumatic, no cyanosis or edema and Homans sign is negative, no sign of DVT  Lab Results: CBC: Basename 08/13/12 0400 08/12/12 0420  WBC 9.9 11.6*  HGB 6.9* 7.3*  HCT 20.3* 21.6*  PLT 75* 66*   BMET:  Basename 08/13/12 0400 08/12/12 0420  NA 135 135  K 4.4 4.4  CL 103 104  CO2 26 23  GLUCOSE 133* 139*  BUN 22 18  CREATININE 1.03 1.03  CALCIUM 8.2* 8.1*    PT/INR:  Basename 08/10/12 1630  LABPROT 21.2*  INR 1.80*   Radiology: Dg Chest Port 1 View  08/13/2012  *RADIOLOGY REPORT*  Clinical Data: Coronary artery disease, thoracic aortic aneurysm, and aortic insufficiency.  Postop.  PORTABLE CHEST - 1 VIEW  Comparison: 08/12/2012  Findings: Left chest tube has been removed.  No pneumothorax identified.  Right jugular Cordis remains in place.  Low lung volumes  are again seen.  Small bilateral pleural effusions and bibasilar atelectasis show no significant change.  Cardiomegaly stable.  IMPRESSION:  1.  No pneumothorax identified following chest tube removal. 2.  No significant change in bibasilar atelectasis and small bilateral pleural effusions.  Original Report Authenticated By: Danae Orleans, M.D.   Dg Chest Port 1 View  08/12/2012  *RADIOLOGY REPORT*  Clinical Data: Aortic aneurysm  PORTABLE CHEST - 1 VIEW  Comparison: Yesterday  Findings: Swan-Ganz catheter removed with the introducer left in place.  Stable left chest tube without pneumothorax.  Low volumes. Bibasilar opacities stable.  Cardiac silhouette remains prominent likely due to low volumes. Stable mild vascular congestion.  IMPRESSION: No pneumothorax.  No significant change in bibasilar atelectasis. Stable mild vascular congestion.  Original Report Authenticated By: Donavan Burnet, M.D.     Assessment/Plan: S/P Procedure(s) (LRB): CORONARY ARTERY BYPASS GRAFTING (CABG) (N/A) THORACIC ASCENDING ANEURYSM REPAIR (AAA) (N/A) AORTIC VALVE REPLACEMENT (AVR) (N/A) Expected Acute  Blood - loss Anemia, hct now to 20% will transfuse one unit PRBC this am Diuresis after blood in Renal function stable Thrombocytopenia improved but still avoid heparin     Delight Ovens MD  Beeper [phone] Office [phone] 08/13/2012 7:54 AM

## 2012-08-13 NOTE — Progress Notes (Signed)
Patient ID: FAJR FIFE, female   DOB: December 25, 1946, 66 y.o.   MRN: [id number]  Filed Vitals:   08/13/12 1800 08/13/12 1900 08/13/12 2000 08/13/12 2007  BP: 131/61 113/42 117/62   Pulse: 93 97 95   Temp:    98.7 F (37.1 C)  TempSrc:    Oral  Resp: 21 25 25    Height:      Weight:      SpO2: 96% 93% 90%    Transfused today CBC    Component Value Date/Time   WBC 11.9* 08/13/2012 1435   RBC 2.83* 08/13/2012 1435   HGB 8.2* 08/13/2012 1435   HCT 24.2* 08/13/2012 1435   PLT 105* 08/13/2012 1435   MCV 85.5 08/13/2012 1435   MCH 29.0 08/13/2012 1435   MCHC 33.9 08/13/2012 1435   RDW 13.8 08/13/2012 1435   LYMPHSABS 2.1 08/04/2012 1619   MONOABS 0.5 08/04/2012 1619   EOSABS 0.2 08/04/2012 1619   BASOSABS 0.0 08/04/2012 1619    Urine output ok  ambulated

## 2012-08-13 NOTE — Significant Event (Signed)
1350pm-pt completed a unit of pRBC. No reactions noted. VS stable duration of transfusion and post transfusion.  Sent post transfusion CBC. Will monitor. Kamill Fulbright, Charity fundraiser.

## 2012-08-13 NOTE — Progress Notes (Signed)
CRITICAL VALUE ALERT  Critical value received:  Hemoglobin 6.9  Date of notification:  08/13/2012  Time of notification:  05:05  Critical value read back:yes  Nurse who received alert:  Flinchum, Fonda Kinder., RN  MD notified (1st page):  Notification not required per MD order.   Time of first page:  N/A  Responding MD:  N/A  Time MD responded:  N/A  Note:  MD order states DO NOT notify physician unless Hgb less than 6.  Will discuss hemoglobin=6.9 with MD in morning rounds.  Pt's vital signs stable, breathing comfortably and resting in chair. Will continue to monitor.   Flinchum, Fonda Kinder., RN

## 2012-08-14 ENCOUNTER — Inpatient Hospital Stay (HOSPITAL_COMMUNITY): Payer: Medicare Other

## 2012-08-14 DIAGNOSIS — I2510 Atherosclerotic heart disease of native coronary artery without angina pectoris: Secondary | ICD-10-CM

## 2012-08-14 LAB — CBC
HCT: 22.3 % — ABNORMAL LOW (ref 36.0–46.0)
Hemoglobin: 7.6 g/dL — ABNORMAL LOW (ref 12.0–15.0)
MCH: 29.2 pg (ref 26.0–34.0)
MCHC: 34.1 g/dL (ref 30.0–36.0)
MCV: 85.8 fL (ref 78.0–100.0)
Platelets: 128 10*3/uL — ABNORMAL LOW (ref 150–400)
RBC: 2.6 MIL/uL — ABNORMAL LOW (ref 3.87–5.11)
RDW: 14.5 % (ref 11.5–15.5)
WBC: 9.5 10*3/uL (ref 4.0–10.5)

## 2012-08-14 LAB — BASIC METABOLIC PANEL
BUN: 24 mg/dL — ABNORMAL HIGH (ref 6–23)
CO2: 26 mEq/L (ref 19–32)
Calcium: 8.4 mg/dL (ref 8.4–10.5)
Chloride: 102 mEq/L (ref 96–112)
Creatinine, Ser: 0.96 mg/dL (ref 0.50–1.10)
GFR calc Af Amer: 69 mL/min — ABNORMAL LOW (ref >90)
GFR calc non Af Amer: 59 mL/min — ABNORMAL LOW (ref >90)
Glucose, Bld: 106 mg/dL — ABNORMAL HIGH (ref 70–99)
Potassium: 4 mEq/L (ref 3.5–5.1)
Sodium: 136 mEq/L (ref 135–145)

## 2012-08-14 LAB — GLUCOSE, CAPILLARY
Glucose-Capillary: 114 mg/dL — ABNORMAL HIGH (ref 70–99)
Glucose-Capillary: 111 mg/dL — ABNORMAL HIGH (ref 70–99)

## 2012-08-14 MED ORDER — DOCUSATE SODIUM 100 MG PO CAPS
200.0000 mg | ORAL_CAPSULE | Freq: Every day | ORAL | Status: DC
  Administered 2012-08-15 – 2012-08-16 (×2): 200 mg via ORAL
  Filled 2012-08-14 (×3): qty 2

## 2012-08-14 MED ORDER — OXYCODONE HCL 5 MG PO TABS
5.0000 mg | ORAL_TABLET | ORAL | Status: DC | PRN
  Administered 2012-08-14: 5 mg via ORAL
  Filled 2012-08-14 (×2): qty 2

## 2012-08-14 MED ORDER — SODIUM CHLORIDE 0.9 % IV SOLN: 250.0000 mL | INTRAVENOUS | Status: DC | PRN

## 2012-08-14 MED ORDER — METOPROLOL TARTRATE 12.5 MG HALF TABLET
12.5000 mg | ORAL_TABLET | Freq: Two times a day (BID) | ORAL | Status: DC
Start: 2012-08-14 — End: 2012-08-15
  Administered 2012-08-14 – 2012-08-15 (×2): 12.5 mg via ORAL
  Filled 2012-08-14 (×3): qty 1

## 2012-08-14 MED ORDER — ONDANSETRON HCL 4 MG PO TABS: 4.0000 mg | ORAL_TABLET | Freq: Four times a day (QID) | ORAL | Status: DC | PRN

## 2012-08-14 MED ORDER — TRAMADOL HCL 50 MG PO TABS
50.0000 mg | ORAL_TABLET | ORAL | Status: DC | PRN
  Administered 2012-08-16 – 2012-08-17 (×2): 100 mg via ORAL
  Filled 2012-08-14 (×2): qty 2

## 2012-08-14 MED ORDER — SODIUM CHLORIDE 0.9 % IJ SOLN: 3.0000 mL | INTRAMUSCULAR | Status: DC | PRN

## 2012-08-14 MED ORDER — POTASSIUM CHLORIDE CRYS ER 20 MEQ PO TBCR
20.0000 meq | EXTENDED_RELEASE_TABLET | Freq: Two times a day (BID) | ORAL | Status: DC
  Administered 2012-08-14 – 2012-08-15 (×2): 20 meq via ORAL
  Filled 2012-08-14 (×3): qty 1

## 2012-08-14 MED ORDER — BISACODYL 5 MG PO TBEC: 10.0000 mg | DELAYED_RELEASE_TABLET | Freq: Every day | ORAL | Status: DC | PRN

## 2012-08-14 MED ORDER — ONDANSETRON HCL 4 MG/2ML IJ SOLN: 4.0000 mg | Freq: Four times a day (QID) | INTRAMUSCULAR | Status: DC | PRN

## 2012-08-14 MED ORDER — BISACODYL 10 MG RE SUPP: 10.0000 mg | Freq: Every day | RECTAL | Status: DC | PRN

## 2012-08-14 MED ORDER — FLUCONAZOLE 100 MG PO TABS
100.0000 mg | ORAL_TABLET | Freq: Every day | ORAL | Status: DC
  Administered 2012-08-14 – 2012-08-16 (×3): 100 mg via ORAL
  Filled 2012-08-14 (×4): qty 1

## 2012-08-14 MED ORDER — PANTOPRAZOLE SODIUM 40 MG PO TBEC
40.0000 mg | DELAYED_RELEASE_TABLET | Freq: Every day | ORAL | Status: DC
  Administered 2012-08-15 – 2012-08-17 (×2): 40 mg via ORAL
  Filled 2012-08-14 (×2): qty 1

## 2012-08-14 MED ORDER — FERROUS GLUCONATE 324 (38 FE) MG PO TABS
324.0000 mg | ORAL_TABLET | Freq: Two times a day (BID) | ORAL | Status: DC
  Administered 2012-08-14 – 2012-08-17 (×6): 324 mg via ORAL
  Filled 2012-08-14 (×8): qty 1

## 2012-08-14 MED ORDER — MOVING RIGHT ALONG BOOK
Freq: Once | Status: AC
  Administered 2012-08-14: 20:00:00
  Filled 2012-08-14: qty 1

## 2012-08-14 MED ORDER — SODIUM CHLORIDE 0.9 % IJ SOLN
3.0000 mL | Freq: Two times a day (BID) | INTRAMUSCULAR | Status: DC
  Administered 2012-08-14 – 2012-08-16 (×5): 3 mL via INTRAVENOUS

## 2012-08-14 NOTE — Plan of Care (Signed)
Problem: Phase III Progression Outcomes Goal: Time patient transferred to PCTU/Telemetry POD Outcome: Completed/Met Date Met:  08/14/12 1600

## 2012-08-14 NOTE — Plan of Care (Signed)
Problem: Phase III Progression Outcomes Goal: No anginal pain Outcome: Completed/Met Date Met:  08/14/12 Denies chest pain. Will continue to monitor. Goal: Vascular site scale level 0 - I Vascular Site Scale Level 0: No bruising/bleeding/hematoma Level I (Mild): Bruising/Ecchymosis, minimal bleeding/ooozing, palpable hematoma < 3 cm Level II (Moderate): Bleeding not affecting hemodynamic parameters, pseudoaneurysm, palpable hematoma > 3 cm  Outcome: Completed/Met Date Met:  08/14/12 Level 0 per assessments. Goal: Discharge plan remains appropriate-arrangements made Outcome: Progressing Pt will be DC'd home with family.

## 2012-08-14 NOTE — Progress Notes (Signed)
4 Days Post-Op Procedure(s) (LRB): CORONARY ARTERY BYPASS GRAFTING (CABG) (N/A) THORACIC ASCENDING ANEURYSM REPAIR (AAA) (N/A) AORTIC VALVE REPLACEMENT (AVR) (N/A) Subjective: Complain of itching and burning in groins from rash.  Objective: Vital signs in last 24 hours: Temp:  [98.1 F (36.7 C)-98.7 F (37.1 C)] 98.1 F (36.7 C) (08/17 0400) Pulse Rate:  [89-101] 95  (08/17 1200) Cardiac Rhythm:  [-] Bundle branch block;Normal sinus rhythm (08/17 1200) Resp:  [12-27] 20  (08/17 1200) BP: (91-133)/(40-67) 119/53 mmHg (08/17 1200) SpO2:  [90 %-99 %] 95 % (08/17 1200) Weight:  [103.9 kg (229 lb 0.9 oz)] 103.9 kg (229 lb 0.9 oz) (08/17 0400)  Hemodynamic parameters for last 24 hours:    Intake/Output from previous day: 08/16 0701 - 08/17 0700 In: 1557 [P.O.:960; I.V.:243; Blood:350; IV Piggyback:4] Out: 2225 [Urine:2225] Intake/Output this shift: Total I/O In: 240 [P.O.:240] Out: 700 [Urine:700]  General appearance: alert and cooperative Neurologic: intact Heart: regular rate and rhythm, S1, S2 normal, no murmur, click, rub or gallop Lungs: clear to auscultation bilaterally Extremities: extremities normal, atraumatic, no cyanosis or edema.  Candida rash in groins Wound: incision ok  Lab Results:  Basename 08/14/12 0300 08/13/12 1435  WBC 9.5 11.9*  HGB 7.6* 8.2*  HCT 22.3* 24.2*  PLT 128* 105*   BMET:  Basename 08/14/12 0300 08/13/12 0400  NA 136 135  K 4.0 4.4  CL 102 103  CO2 26 26  GLUCOSE 106* 133*  BUN 24* 22  CREATININE 0.96 1.03  CALCIUM 8.4 8.2*    PT/INR: No results found for this basename: LABPROT,INR in the last 72 hours ABG    Component Value Date/Time   PHART 7.364 08/11/2012 0413   HCO3 22.5 08/11/2012 0413   TCO2 19 08/11/2012 1643   ACIDBASEDEF 2.0 08/11/2012 0413   O2SAT 97.0 08/11/2012 0413   CBG (last 3)   Basename 08/14/12 1150 08/14/12 0758 08/13/12 2202  GLUCAP 106* 114* 115*   CXR: bibasilar atelectasis  Assessment/Plan: S/P  Procedure(s) (LRB): CORONARY ARTERY BYPASS GRAFTING (CABG) (N/A) THORACIC ASCENDING ANEURYSM REPAIR (AAA) (N/A) AORTIC VALVE REPLACEMENT (AVR) (N/A) Acute blood loss anemia:  Hgb increased to 7.6 after transfusion yesterday but still weak so I will transfuse another unit. Discussed with patient. Start Fe2+. Start Diflucan for groin candida and continue mycostatin powder. Mobilize Diuresis Plan for transfer to step-down: see transfer orders   LOS: 10 days    Thais Silberstein K 08/14/2012

## 2012-08-15 LAB — BASIC METABOLIC PANEL
BUN: 17 mg/dL (ref 6–23)
CO2: 26 mEq/L (ref 19–32)
GFR calc non Af Amer: 71 mL/min — ABNORMAL LOW (ref >90)
Glucose, Bld: 106 mg/dL — ABNORMAL HIGH (ref 70–99)
Potassium: 4 mEq/L (ref 3.5–5.1)

## 2012-08-15 LAB — TYPE AND SCREEN
ABO/RH(D): A NEG
Antibody Screen: POSITIVE
DAT, IgG: NEGATIVE
Donor AG Type: NEGATIVE
Donor AG Type: NEGATIVE
Unit division: 0
Unit division: 0

## 2012-08-15 LAB — CBC
HCT: 28.6 % — ABNORMAL LOW (ref 36.0–46.0)
Hemoglobin: 9.8 g/dL — ABNORMAL LOW (ref 12.0–15.0)
MCH: 29.9 pg (ref 26.0–34.0)
MCHC: 34.3 g/dL (ref 30.0–36.0)
RBC: 3.28 MIL/uL — ABNORMAL LOW (ref 3.87–5.11)

## 2012-08-15 LAB — GLUCOSE, CAPILLARY
Glucose-Capillary: 83 mg/dL (ref 70–99)
Glucose-Capillary: 103 mg/dL — ABNORMAL HIGH (ref 70–99)

## 2012-08-15 MED ORDER — POTASSIUM CHLORIDE CRYS ER 20 MEQ PO TBCR
20.0000 meq | EXTENDED_RELEASE_TABLET | Freq: Every day | ORAL | Status: DC
  Administered 2012-08-15 – 2012-08-16 (×3): 20 meq via ORAL
  Filled 2012-08-15 (×2): qty 1

## 2012-08-15 MED ORDER — FUROSEMIDE 40 MG PO TABS
40.0000 mg | ORAL_TABLET | Freq: Every day | ORAL | Status: DC
  Administered 2012-08-15 – 2012-08-16 (×2): 40 mg via ORAL
  Filled 2012-08-15 (×3): qty 1

## 2012-08-15 MED ORDER — METOPROLOL TARTRATE 25 MG PO TABS
25.0000 mg | ORAL_TABLET | Freq: Two times a day (BID) | ORAL | Status: DC
  Administered 2012-08-15 – 2012-08-16 (×3): 25 mg via ORAL
  Filled 2012-08-15 (×5): qty 1

## 2012-08-15 MED ORDER — DIPHENHYDRAMINE HCL 25 MG PO CAPS
25.0000 mg | ORAL_CAPSULE | Freq: Four times a day (QID) | ORAL | Status: DC | PRN
Start: 2012-08-15 — End: 2012-08-17
  Administered 2012-08-16: 25 mg via ORAL
  Filled 2012-08-15: qty 1

## 2012-08-15 MED ORDER — POTASSIUM CHLORIDE CRYS ER 20 MEQ PO TBCR
EXTENDED_RELEASE_TABLET | ORAL | Status: AC
  Administered 2012-08-15: 16:00:00
  Filled 2012-08-15: qty 1

## 2012-08-15 NOTE — Progress Notes (Addendum)
[address]            [place] [address]          [phone]     5 Days Post-Op  Procedure(s) (LRB): CORONARY ARTERY BYPASS GRAFTING (CABG) (N/A) THORACIC ASCENDING ANEURYSM REPAIR (AAA) (N/A) AORTIC VALVE REPLACEMENT (AVR) (N/A) Subjective: Doing well, new pruritic rash on lower legs and arms  Objective  Telemetry sinus rhythm and sinus tachy  Temp:  [98.4 F (36.9 C)-99.7 F (37.6 C)] 99.7 F (37.6 C) (08/18 0535) Pulse Rate:  [93-105] 101  (08/18 0535) Resp:  [18-24] 18  (08/18 0535) BP: (99-144)/(53-80) 126/65 mmHg (08/18 0535) SpO2:  [92 %-96 %] 92 % (08/18 0535) Weight:  [226 lb (102.513 kg)] 226 lb (102.513 kg) (08/18 0535)   Intake/Output Summary (Last 24 hours) at 08/15/12 1146 Last data filed at 08/14/12 1514  Gross per 24 hour  Intake 509.17 ml  Output    650 ml  Net -140.83 ml       General appearance: alert, cooperative and no distress Heart: regular rate and rhythm and S1, S2 normal Lungs: mildly diminished in bases Abdomen: soft, nontender Extremities: + BLE edema Wound: insisions healing well skin- pruritic rash lower legs and forearms  Lab Results:  Basename 08/15/12 0615 08/14/12 0300  NA 139 136  K 4.0 4.0  CL 103 102  CO2 26 26  GLUCOSE 106* 106*  BUN 17 24*  CREATININE 0.83 0.96  CALCIUM 9.2 8.4  MG -- --  PHOS -- --   No results found for this basename: AST:2,ALT:2,ALKPHOS:2,BILITOT:2,PROT:2,ALBUMIN:2 in the last 72 hours No results found for this basename: LIPASE:2,AMYLASE:2 in the last 72 hours  Basename 08/15/12 0615 08/14/12 0300  WBC 9.1 9.5  NEUTROABS -- --  HGB 9.8* 7.6*  HCT 28.6* 22.3*  MCV 87.2 85.8  PLT 170 128*   No results found for this basename: CKTOTAL:4,CKMB:4,TROPONINI:4 in the last 72 hours No components found with this basename: POCBNP:3 No results found for this basename: DDIMER in the last 72 hours No results found for this basename: HGBA1C in the last 72  hours No results found for this basename: CHOL,HDL,LDLCALC,TRIG,CHOLHDL in the last 72 hours No results found for this basename: TSH,T4TOTAL,FREET3,T3FREE,THYROIDAB in the last 72 hours No results found for this basename: VITAMINB12,FOLATE,FERRITIN,TIBC,IRON,RETICCTPCT in the last 72 hours  Medications: Scheduled    . atorvastatin  40 mg Oral q1800  . docusate sodium  200 mg Oral Daily  . feeding supplement  1 Container Oral Q1500  . ferrous gluconate  324 mg Oral BID WC  . fluconazole  100 mg Oral Daily  . metoprolol tartrate  12.5 mg Oral BID  . moving right along book   Does not apply Once  . nystatin   Topical BID  . pantoprazole  40 mg Oral QAC breakfast  . potassium chloride  20 mEq Oral BID  . sodium chloride  3 mL Intravenous Q12H  . DISCONTD: acetaminophen (TYLENOL) oral liquid 160 mg/5 mL  975 mg Per Tube Q6H  . DISCONTD: acetaminophen  1,000 mg Oral Q6H  . DISCONTD: aspirin  324 mg Per Tube Daily  . DISCONTD: aspirin EC  81 mg Oral Daily  . DISCONTD: bisacodyl  10 mg Oral Daily  . DISCONTD: bisacodyl  10 mg Rectal Daily  . DISCONTD: docusate sodium  200 mg Oral Daily  . DISCONTD: insulin aspart  0-24 Units Subcutaneous TID AC & HS  . DISCONTD: metoprolol tartrate  12.5 mg Per Tube BID  . DISCONTD: metoprolol tartrate  12.5 mg Oral BID  . DISCONTD: pantoprazole  40 mg Oral Q1200  . DISCONTD: sodium chloride  3 mL Intravenous Q12H     Radiology/Studies:  Dg Chest Port 1 View  08/14/2012  *RADIOLOGY REPORT*  Clinical Data: Coronary artery disease, thoracic aortic aneurysm and aortic insufficiency.  Postoperative film.  PORTABLE CHEST - 1 VIEW  Comparison: Plain films of the chest 08/13/2012 and 08/12/2012.  Findings: Right IJ sheath remains in place.  Small bilateral pleural effusions and basilar atelectasis are again seen.  There is cardiomegaly but no edema.  No pneumothorax.  IMPRESSION: No change in small bilateral pleural effusions and basilar atelectasis.  Original  Report Authenticated By: Bernadene Bell. Maricela Curet, M.D.    INR: Will add last result for INR, ABG once components are confirmed Will add last 4 CBG results once components are confirmed  Assessment/Plan: S/P Procedure(s) (LRB): CORONARY ARTERY BYPASS GRAFTING (CABG) (N/A) THORACIC ASCENDING ANEURYSM REPAIR (AAA) (N/A) AORTIC VALVE REPLACEMENT (AVR) (N/A)  1. Doing well overall 2 D/c oxycodone as may be source of rash, add benedryl, use ultram for pain 3 increase beta blocker dose 4 labs stable, H/H improved 5 add diuretic  LOS: 11 days    GOLD,WAYNE E 8/18/201311:46 AM     Chart reviewed, patient examined, agree with above.

## 2012-08-16 DIAGNOSIS — Z952 Presence of prosthetic heart valve: Secondary | ICD-10-CM

## 2012-08-16 DIAGNOSIS — Z95828 Presence of other vascular implants and grafts: Secondary | ICD-10-CM

## 2012-08-16 DIAGNOSIS — Z951 Presence of aortocoronary bypass graft: Secondary | ICD-10-CM

## 2012-08-16 LAB — GLUCOSE, CAPILLARY
Glucose-Capillary: 99 mg/dL (ref 70–99)
Glucose-Capillary: 95 mg/dL (ref 70–99)

## 2012-08-16 MED ORDER — FLUCONAZOLE 100 MG PO TABS: 100.0000 mg | ORAL_TABLET | Freq: Every day | ORAL | Status: AC

## 2012-08-16 MED ORDER — METOPROLOL TARTRATE 25 MG PO TABS: 25.0000 mg | ORAL_TABLET | Freq: Two times a day (BID) | ORAL | Status: DC

## 2012-08-16 MED ORDER — FUROSEMIDE 40 MG PO TABS: 40.0000 mg | ORAL_TABLET | Freq: Every day | ORAL | Status: DC

## 2012-08-16 MED ORDER — NYSTATIN 100000 UNIT/GM EX POWD: Freq: Four times a day (QID) | CUTANEOUS | Status: DC

## 2012-08-16 MED ORDER — POTASSIUM CHLORIDE CRYS ER 20 MEQ PO TBCR: 20.0000 meq | EXTENDED_RELEASE_TABLET | Freq: Every day | ORAL | Status: DC

## 2012-08-16 MED ORDER — FERROUS GLUCONATE 324 (38 FE) MG PO TABS: 324.0000 mg | ORAL_TABLET | Freq: Two times a day (BID) | ORAL | Status: DC

## 2012-08-16 MED ORDER — TRAMADOL HCL 50 MG PO TABS: 50.0000 mg | ORAL_TABLET | ORAL | Status: AC | PRN

## 2012-08-16 NOTE — Progress Notes (Addendum)
6 Days Post-Op Procedure(s) (LRB): CORONARY ARTERY BYPASS GRAFTING (CABG) (N/A) THORACIC ASCENDING ANEURYSM REPAIR (AAA) (N/A) AORTIC VALVE REPLACEMENT (AVR) (N/A)  Subjective:  Ms. Flitton is without complaints this morning.  Her rash has improved.  She is ambulating +BM  Objective: Vital signs in last 24 hours: Temp:  [98.8 F (37.1 C)-99.7 F (37.6 C)] 99.3 F (37.4 C) (08/19 0444) Pulse Rate:  [103-109] 103  (08/19 0444) Cardiac Rhythm:  [-] Sinus tachycardia (08/18 1945) Resp:  [18-20] 18  (08/19 0444) BP: (101-137)/(57-74) 119/69 mmHg (08/19 0444) SpO2:  [91 %-100 %] 95 % (08/19 0444)  Intake/Output from previous day: 08/18 0701 - 08/19 0700 In: -  Out: 300 [Urine:300]  General appearance: alert, cooperative and no distress Heart: regular rate and rhythm Lungs: clear to auscultation bilaterally Abdomen: soft, non-tender; bowel sounds normal; no masses,  no organomegaly Extremities: edema trace Wound: clean and dry  Lab Results:  Basename 08/15/12 0615 08/14/12 0300  WBC 9.1 9.5  HGB 9.8* 7.6*  HCT 28.6* 22.3*  PLT 170 128*   BMET:  Basename 08/15/12 0615 08/14/12 0300  NA 139 136  K 4.0 4.0  CL 103 102  CO2 26 26  GLUCOSE 106* 106*  BUN 17 24*  CREATININE 0.83 0.96  CALCIUM 9.2 8.4    PT/INR: No results found for this basename: LABPROT,INR in the last 72 hours ABG    Component Value Date/Time   PHART 7.364 08/11/2012 0413   HCO3 22.5 08/11/2012 0413   TCO2 19 08/11/2012 1643   ACIDBASEDEF 2.0 08/11/2012 0413   O2SAT 97.0 08/11/2012 0413   CBG (last 3)   Basename 08/16/12 0615 08/15/12 2050 08/15/12 1618  GLUCAP 99 97 103*    Assessment/Plan: S/P Procedure(s) (LRB): CORONARY ARTERY BYPASS GRAFTING (CABG) (N/A) THORACIC ASCENDING ANEURYSM REPAIR (AAA) (N/A) AORTIC VALVE REPLACEMENT (AVR) (N/A)  1. CV- NSR on Lopressor 2. Pulm- no acute issues, continue IS 3. Rash- resolved, continue to hold oxycodone 4. DM- preop A1c 6.1, patient not on  agent at home sugars controlled 5. Dispo- d/c EPW, d/c in am   LOS: 12 days    BARRETT, ERIN 08/16/2012   Feels better today,  Plan d/c in am I have seen and examined Charmian Muff and agree with the above assessment  and plan.  Delight Ovens MD Beeper [phone] Office [phone] 08/16/2012 5:19 PM

## 2012-08-16 NOTE — Progress Notes (Signed)
CARDIAC REHAB PHASE I   PRE:  Rate/Rhythm: 104ST  BP:  Supine:   Sitting: 132/60  Standing:    SaO2: 97%RA  MODE:  Ambulation: 400 ft   POST:  Rate/Rhythem: 120ST  BP:  Supine:   Sitting: 130/60  Standing:    SaO2: 93-95%RA 0910-0938 Pt walked 400 ft on RA with asst x 1 and pt holding to siderail. Pt did not want to use walker. [place] Spaniel it would trip her up. Wobbly at times. Wanted to cut walk short. Said she did not rest well last night due to coughing. Stopped several times to rest due to SOB but sats stable. To recliner with call bell. Told pt to walk with someone and not alone.  Duanne Limerick

## 2012-08-16 NOTE — Progress Notes (Signed)
EPW x 2 DC'd per MD order and protocol.  No bleeding or ectopy noted.  Pt tolerated well and instructed to remain in bed x 1 hr.  Will continue to monitor closely. Ave Filter

## 2012-08-16 NOTE — Discharge Summary (Signed)
Physician Discharge Summary  Patient ID: Miranda Hutchinson MRN: [id number] DOB/AGE: 08-20-1946 66 y.o.  Admit date: 08/04/2012 Discharge date: 08/16/2012  Admission Diagnoses:  Patient Active Problem List  Diagnosis  . Coronary artery disease  . Aortic valve insufficiency  . Dilated aortic root  . Essential hypertension, benign  . Pure hypercholesterolemia  . Obesity, unspecified   Discharge Diagnoses:   Patient Active Problem List  Diagnosis  . Coronary artery disease  . Aortic valve insufficiency  . Dilated aortic root  . Essential hypertension, benign  . Pure hypercholesterolemia  . Obesity, unspecified   S/P CABG x3 and Bentall Procedure   Discharged Condition: good  History of Present Illness:   Miranda Hutchinson is a 66 yo morbidly obese African American female who presented to Dr. Norris Cross office with a complaint of exertional chest pain and dyspnea.  She underwent a nuclear stress test which showed anterolateral and inferolateral ischemia.  Due to the these findings it was felt the patient would benefit from cardiac catheterization which was performed on 08/04/2012 and revealed multivessel CAD with a preserved EF and a dilated ascending aorta.  The patient was subsequently admitted to the hospital and placed on IV heparin and nitroglycerin for hypertension.      Hospital Course:   After admission to the hospital [facility] consult was obtained for possible coronary bypass.  Dr. Tyrone Sage evaluated the patient and recommended the patient undergo CTA to evaluate the size and morphology of her dilated aortic root.  He also requested the patient's previous ECHO be available for review. HD #1 patient continued to have hypertension.  She remained on Nitroglycerin drip and was started on oral medications.  HD #2 patient developed headache likely due to Nitroglycerin drip.  This medication was discontinued and the patient was controlled with oral medications.  Her films were reviewed by Dr.  Tyrone Sage who felt the patient would benefit from surgery.  The risks and benefits were explained to the patient and she was agreeable to proceed.  HD #4 patient with exertional dyspnea while ambulating hallways.  HD #6 the patient was taken to the operating room.  She underwent CABG x 3 utilizing LIMA to LAD, SVG to OM1 and SVG to PDA, Biologic Bentall Procedure with a 21mm Edwards Pericardial tissue valve and 26mm Valsalva tube graft, and endoscopic saphenous vein harvest of the right leg.  She tolerated the procedure well and was taken to the SICU in stable condition.  POD #1 the patient was extubated.  The patient was weaned off Neo Synephrine and Dopamine as tolerated.  Her SWAN, and arterial line were removed without difficulty.  POD #2 her chest tubes were removed without difficulty.  POD #3 patient was transfused 1 unit of packed red blood cells for acute post operative blood loss anemia.  Her chest xray did not reveal any evidence of pneumothorax.  POD #4 patient developed pruritic rash in groins.  Felt most likely to be candida.  She was started on Diflucan and Nystatin powder.  She was medically stable and transferred to the step down unit.  POD #5 patient with continued rash now on legs and arms.  Her Oxycodone was discontinued.  POD #6 the patient's external pacing wires were removed without difficulty.  Her rash had resolved.  She is progressing well.  Should no further issues arise we will plan to discharge home in the next 24 hours.  She will need to follow up at [facility] on 09/06/2012 at 1:30 with a chest  xray prior to her appointment at [facility].  She will also need to contact Dr. Malachy Mood office to set up a follow up appointment.             Significant Diagnostic Studies: cardiac catheterization  HEMODYNAMICS: Aortic pressure was 162/40mmHg; LV pressure was 168/52mmHg; LVEDP . There was no significant gradient between the left ventricle and aorta.  ANGIOGRAPHIC DATA: The left main  coronary artery is widely patent and bifurcates into an LAD and left circumflex arteries.  The left anterior descending artery is patent with a 50-60% stenosis in the mid vessel and gives rise to a first diagonal branch which has an ostial stenosis of 90% It then give rise to a second diagonal branch which is patent. This distal LAD has a 70-80% stenosis.  The left circumflex artery is widely patent and gives rise to a large OM1 which has an 80% stenosis in the mid portion of the vessel.  The right coronary artery is diffusely diseased throughout the vessel. There is an aneurysm in the proximal portion and just distal to that there is an 80% stenosis. In the mid vessel there is a 70-80% stenosis. Distally it bifurcates in to a PDA and Pl branches which are patent.  LEFT VENTRICULOGRAM: Left ventricular angiogram was done in the 30 RAO projection and revealed normal left ventricular wall motion and systolic function with an estimated ejection fraction of 60%. LVEDP was 22 mmHg.  ASCENDING AORTOGRAM: There is aneurysmal dilatation of the proximal aorta above the aortic valve.   CT Cardiac Morphology Dilated proximal and mid descending aorta extending from the sinotubular junction to the mid descending aorta.   AORTA AND PULMONARY MEASUREMENTS:  Aortic root (21 - 40 mm):  23 mm at the annulus  35 mm at the sinuses of Valsalva  37 mm at the sinotubular junction  Ascending aorta ( < 40 mm): 47 mm  Aortic arch (< 40 mm): 29 mm  Descending aorta ( < 40 mm): 25 mm  Main pulmonary artery: ( < 30 mm): 26 mm  Treatments: surgery:   Biologic Bentall procedure with a bioprosthetic aortic  valve and 26 mm Valsalva graft and coronary artery bypass grafting x3 with left internal mammary to the left anterior descending coronary artery, reverse saphenous vein graft to the obtuse marginal coronary artery, reverse saphenous vein graft to the posterior descending coronary artery.  Disposition:  Home  Discharge Orders    Future Appointments: Provider: Department: Dept Phone: Center:   09/06/2012 1:30 PM [facility] [phone] [facility]     Medication List  As of 08/16/2012 11:48 AM   TAKE these medications         BIOTIN 5000 5 MG Caps   Generic drug: Biotin   Take 1 capsule by mouth daily.      Cod Liver Oil 1250-135 UNITS Caps   Take 1 capsule by mouth daily.      ferrous gluconate 324 MG tablet   Commonly known as: FERGON   Take 1 tablet (324 mg total) by mouth 2 (two) times daily with a meal.      fluconazole 100 MG tablet   Commonly known as: DIFLUCAN   Take 1 tablet (100 mg total) by mouth daily. For 7 days      furosemide 40 MG tablet   Commonly known as: LASIX   Take 1 tablet (40 mg total) by mouth daily. For 5 days      metoprolol tartrate 25  MG tablet   Commonly known as: LOPRESSOR   Take 1 tablet (25 mg total) by mouth 2 (two) times daily.      potassium chloride SA 20 MEQ tablet   Commonly known as: K-DUR,KLOR-CON   Take 1 tablet (20 mEq total) by mouth daily. For 5 days      rosuvastatin 20 MG tablet   Commonly known as: CRESTOR   Take 20 mg by mouth every morning.      traMADol 50 MG tablet   Commonly known as: ULTRAM   Take 1-2 tablets (50-100 mg total) by mouth every 4 (four) hours as needed.           Follow-up Information    Follow up with Delight Ovens, MD.   Contact information:   [address] [place] [address] [phone]       Follow up with [facility]. (Please get chest xray 1 hour prior to your appointment with Dr. Tyrone Sage)    Contact information:   [address], [address]      Follow up with Quintella Reichert, MD in 2 weeks. (Please contact office to set up appoitnment for 2 weeks from hospital discharge)    Contact information:   [address] [place] [address] [phone]          Signed: Lowella Dandy 08/16/2012,  11:48 AM

## 2012-08-16 NOTE — Op Note (Signed)
NAME:  Miranda Hutchinson, Miranda Hutchinson NO.:  [id number]  MEDICAL RECORD NO.:  [id number]  LOCATION:  2004                         FACILITY:  [facility]  PHYSICIAN:  Sheliah Plane, MD    DATE OF BIRTH:  September 09, 1946  DATE OF PROCEDURE:  08/10/2012 DATE OF DISCHARGE:                              OPERATIVE REPORT   PREOPERATIVE DIAGNOSIS:  Dilated aortic root with aortic insufficiency and severe 3-vessel coronary artery disease.  POSTOPERATIVE DIAGNOSIS:  Dilated aortic root with aortic insufficiency and severe 3-vessel coronary artery disease.  PROCEDURE PERFORMED:  Bental procedure with a bioprosthetic aortic valve and 26 mm Valsalva graft and coronary artery bypass grafting x3 with left internal mammary to the left anterior descending coronary artery, reverse saphenous vein graft to the obtuse marginal coronary artery, reverse saphenous vein graft to the posterior descending coronary artery.  SURGEON:  Sheliah Plane, MD  FIRST ASSISTANT:  Lowella Dandy, PA  BRIEF HISTORY:  The patient is a 66 year old female, who presented with new onset of progressive anginal symptoms and was incidentally found to have a significantly dilated aortic root with moderate aortic insufficiency.  Evaluation of her aortic root was performed with both cardiac catheterization, also a CT scan.  Bental  aortic root replacement and possible aortic valve placement was discussed with the patient.  She did not wish to take Coumadin, wishing to have tissue valve if needed.  Risks and options were discussed with her in detail.  She agreed and signed informed consent.  DESCRIPTION OF PROCEDURE:  With Swan-Ganz and arterial line monitors placed, the patient underwent a general endotracheal anesthesia without incidence.  Skin of chest and leg was prepped with Betadine and draped in usual sterile manner.  Using the Guidant endovein harvesting system, vein was harvested from the right thigh and calf, and was of  good quality and caliber.  Median sternotomy was performed.  Left internal mammary artery was dissected down as pedicle graft.  Distal artery was divided, had good free flow.  Pericardium was opened. Proximal portion of the aortic root was dilated and by the mid ascending aorta, was  Normal in size.  In addition to being dilated almost over 5 cm at the aortic root,  the lateral wall was very thinned.  There was a significant Room on  the ascending aorta to cannulate.  The patient systemically heparinized.  Ascending aorta was cannulated.  The right atrium was cannulated.  An aortic root vent cardioplegia needle was introduced to the ascending aorta.  In addition, a transesophageal echo probe was placed and confirmed a dilated aortic root with moderate aortic insufficiency, and this is dictated under separate note by Anesthesia.  The patient was placed on cardiopulmonary bypass at 2.4 l/min/meter squared. Sites for  anastomosis was selected and dissected out  Of epicardium.  The patient's body temperature was cooled to 30 degrees.  Aortic crossclamp was applied and 800 mL cold blood potassium cardioplegia was administered for initially antegrade until there was a cardiac arrest and the remainder via the retrograde catheter.  Palpable cold solution was also used.  Attention was turned 1st to the coronary anastomoses.  The heart was elevated in 1st obtuse marginal, the primary  supplier of the lateral wall.  The heart was opened and admitted a 1.5 mm probe.  Using a running 7-0 Prolene, distal anastomosis was performed.  Additional cold blood cardioplegia was administered down the vein graft.  Attention was then turned to the posterior descending coronary artery, which was opened and was of somewhat small, diffusely diseased vessel, but was admitted 1 mm probe.  Using running 7-0 Prolene, distal anastomosis was performed. Attention was then turned to the left anterior descending coronary artery  which was opened in the distal 3rd of the vessel and was approximately 1.2-1.3 mm in size.  Using a running 8-0 Prolene, the left internal mammary artery was anastomosed to left anterior descending. The fascia tacked to the epicardium.  Additional cold blood cardioplegia was administered down the veins.  Attention was then turned to the aortic root, where the area that was thinned out just before the normal aorta was opened.  This gave good exposure of the dilated aortic root. A partially calcified aortic leaflet of noncoronary leaflet and  the left non- coronary leaflet.  The leaflet is somewhat thickened and fixed, resulting in aortic insufficiency.  In addition, the aortic root was dilated as noted.  In addition, it appeared that the etiology of the dilated aortic root may have been old localized aortic dissection that resulted dilated aortic root.  A 21 pericardial tissue valve was selected and a 26 mm Valsalva graft.  Yan Okray  pericardial tissue valve, serial [id number], model #3300TFX, 21 mm,was selected.  #2 Tycron pledgeted sutures were placed circumferentially around the aortic anulus with the pledgets on the exterior surface of the aorta.  With these pledgets, sutures all in place, the aortic graft was then seated in place with Tycron sutures and the  graft seated together into the aortic root. The root was then pressurized without any evidence of_ leak. Attention was then turned to the left coronary button, which was dissected to allow attachment to the graft. The posterolateral aspect of the graft was opened to accommodate button.  Using running 6-0 Prolene, the left coronary button was anastomosed to the Valsalva portion of the aortic graft.  The graft itself was then trimmed to length and the distal anastomosis of the graft was performed with ascending aorta, which was relatively normal in size_.  The right coronary button was then developed, graft opened and the 6-0 Prolene was  then anastomosed to the ascending aorta. Heart was allowed to passively fill _bull dog on the mammary artery  Was removed with prompt rise in myocardial septal temperature.  Aortic crossclamp was left in place .  The bulldog was placed back on the mammary artery.  Two proximal venous anastomoses were then created with the Valsalva graft, each was sewn on with a running 6-0 Prolene.  Air was evacuated from the grafts and aortotomies were completed.  Aortic crossclamp was then removed with total cross-clamp time of 235 minutes.  A 16-gauge needle was introduced into the left ventricular apex.  Atrial and ventricular pacing wires were applied and the patient was atrially paced in increased rate.  She appeared to have normal conduction with body temperature rewarmed.  The patient was then ventilated and weaned from cardiopulmonary bypass without difficulty with the total pump time of 298 minutes.  Sites of anastomoses were inspected.  The proximal and distal anastomosis were inspected also with free of bleeding.  The patient was then weaned from cardiopulmonary bypass and remained hemodynamically stable.  She was decannulated in usual fashion.  Protamine sulfate was administered with operative field hemostatic.  Two Blake mediastinal drains were left in place, and a left pleural 28 chest tube was left in place.  Pericardium was loosely reapproximated. Sternum was reapproximated with #6 stainless steel wire.  Fascia closed 3-0 vicryl. A subcuticular stitch was applied to close the skin edges.  Dry dressings were applied.  Sponge and needle count was reported as correct at the completion of procedure.  The patient tolerated the procedure without obvious complication and transferred to Surgical Intensive Care Unit for further postop care.     Sheliah Plane, MD     EG/MEDQ  D:  08/15/2012  T:  08/16/2012  Job:  [id number]  cc:   Armanda Magic, M.D.

## 2012-08-17 NOTE — Progress Notes (Signed)
Discharge instructions, appointments, and medications reviewed with patient and family members who were present.  No questions or concerns.  Prescriptions given to patient.  Patient and family discharged with wheelchair via volunteer services. Ave Filter

## 2012-08-17 NOTE — Progress Notes (Signed)
7 Days Post-Op Procedure(s) (LRB): CORONARY ARTERY BYPASS GRAFTING (CABG) (N/A) THORACIC ASCENDING ANEURYSM REPAIR (AAA) (N/A) AORTIC VALVE REPLACEMENT (AVR) (N/A)  Subjective:  Miranda Hutchinson has no complaints this morning.  She is hopeful to be discharged home  Objective: Vital signs in last 24 hours: Temp:  [99 F (37.2 C)-99.1 F (37.3 C)] 99 F (37.2 C) (08/20 0521) Pulse Rate:  [97-105] 97  (08/20 0521) Cardiac Rhythm:  [-] Sinus tachycardia (08/19 1945) Resp:  [18-20] 18  (08/20 0521) BP: (118-126)/(66-76) 126/74 mmHg (08/20 0521) SpO2:  [91 %-97 %] 91 % (08/20 0521) Weight:  [222 lb 0.1 oz (100.7 kg)] 222 lb 0.1 oz (100.7 kg) (08/20 0521)  General appearance: alert, cooperative and no distress Neurologic: intact Heart: regular rate and rhythm Lungs: clear to auscultation bilaterally Abdomen: soft, non-tender; bowel sounds normal; no masses,  no organomegaly Extremities: edema trace Wound: clean and dry  Lab Results:  [facility] 08/15/12 0615  WBC 9.1  HGB 9.8*  HCT 28.6*  PLT 170   BMET:  Basename 08/15/12 0615  NA 139  K 4.0  CL 103  CO2 26  GLUCOSE 106*  BUN 17  CREATININE 0.83  CALCIUM 9.2    PT/INR: No results found for this basename: LABPROT,INR in the last 72 hours ABG    Component Value Date/Time   PHART 7.364 08/11/2012 0413   HCO3 22.5 08/11/2012 0413   TCO2 19 08/11/2012 1643   ACIDBASEDEF 2.0 08/11/2012 0413   O2SAT 97.0 08/11/2012 0413   CBG (last 3)   Basename 08/16/12 1559 08/16/12 1114 08/16/12 0615  GLUCAP 95 92 99    Assessment/Plan: S/P Procedure(s) (LRB): CORONARY ARTERY BYPASS GRAFTING (CABG) (N/A) THORACIC ASCENDING ANEURYSM REPAIR (AAA) (N/A) AORTIC VALVE REPLACEMENT (AVR) (N/A)  1. CV- NSR on Lopressor 2. Pulm- no issues, continue IS at discharge 3. Rash- resolved, patient instructed to make sure she lists oxycodone as an allergy with further medical care 4. DM- preop A1c of 6.1 borderline, will have patient follow up with  PCP 5. Dispo- patient doing well will d/c home today   LOS: 13 days    Miranda Hutchinson 08/17/2012

## 2012-08-17 NOTE — Progress Notes (Signed)
[id number] Education completed with pt and friend. Permission given to refer to [facility]. Kayleana Waites DunlapRN

## 2012-08-19 ENCOUNTER — Encounter: Payer: Self-pay | Admitting: Cardiothoracic Surgery

## 2012-08-21 ENCOUNTER — Emergency Department (HOSPITAL_COMMUNITY)
Admission: EM | Admit: 2012-08-21 | Discharge: 2012-08-21 | Disposition: A | Discharge status: Home / Self Care | Payer: Medicare Other | Attending: Emergency Medicine | Admitting: Emergency Medicine

## 2012-08-21 ENCOUNTER — Encounter (HOSPITAL_COMMUNITY): Payer: Self-pay | Admitting: *Deleted

## 2012-08-21 DIAGNOSIS — Z954 Presence of other heart-valve replacement: Secondary | ICD-10-CM | POA: Insufficient documentation

## 2012-08-21 DIAGNOSIS — Z951 Presence of aortocoronary bypass graft: Secondary | ICD-10-CM | POA: Insufficient documentation

## 2012-08-21 DIAGNOSIS — Z87891 Personal history of nicotine dependence: Secondary | ICD-10-CM | POA: Insufficient documentation

## 2012-08-21 DIAGNOSIS — T8131XA Disruption of external operation (surgical) wound, not elsewhere classified, initial encounter: Secondary | ICD-10-CM | POA: Insufficient documentation

## 2012-08-21 DIAGNOSIS — E785 Hyperlipidemia, unspecified: Secondary | ICD-10-CM | POA: Insufficient documentation

## 2012-08-21 DIAGNOSIS — Y832 Surgical operation with anastomosis, bypass or graft as the cause of abnormal reaction of the patient, or of later complication, without mention of misadventure at the time of the procedure: Secondary | ICD-10-CM | POA: Insufficient documentation

## 2012-08-21 DIAGNOSIS — E039 Hypothyroidism, unspecified: Secondary | ICD-10-CM | POA: Insufficient documentation

## 2012-08-21 MED ORDER — BACITRACIN 500 UNIT/GM EX OINT
1.0000 "application " | TOPICAL_OINTMENT | Freq: Two times a day (BID) | CUTANEOUS | Status: DC
  Filled 2012-08-21 (×17): qty 14

## 2012-08-21 MED ORDER — BACITRACIN ZINC 500 UNIT/GM EX OINT
TOPICAL_OINTMENT | Freq: Two times a day (BID) | CUTANEOUS | Status: DC
  Filled 2012-08-21: qty 15

## 2012-08-21 MED ORDER — MUPIROCIN CALCIUM 2 % EX CREA: TOPICAL_CREAM | Freq: Two times a day (BID) | CUTANEOUS | Status: AC

## 2012-08-21 NOTE — ED Notes (Signed)
Pt reports chills yesterday, pt denies current chills, pt denies fever

## 2012-08-21 NOTE — ED Provider Notes (Signed)
History     CSN: [id number]  Arrival date & time 08/21/12  1814   First MD Initiated Contact with Patient 08/21/12 1956      Chief Complaint  Patient presents with  . Wound Infection    (Consider location/radiation/quality/duration/timing/severity/associated sxs/prior treatment) HPI Comments: Patient had CABG, August 13 performed by Dr. Tyrone Sage.  Cardiothoracic surgeon presents today with concern over a suture that appears to be loose and having a small amount of drainage.  She noticed tonight that it has an odor.  The history is provided by the patient.    Past Medical History  Diagnosis Date  . Dyslipidemia   . Aortic insufficiency   . Hyperthyroidism   . Heart murmur   . Exertional dyspnea   . Hypothyroidism     Past Surgical History  Procedure Date  . Tonsillectomy     "I was a little child"  . Abdominal hysterectomy 1990's  . Cardiac catheterization 08/04/2012  . Coronary artery bypass graft 08/10/2012    Procedure: CORONARY ARTERY BYPASS GRAFTING (CABG);  Surgeon: Delight Ovens, MD;  Location: [facility] OR;  Service: Open Heart Surgery;  Laterality: N/A;  . Thoracic aortic aneurysm repair 08/10/2012    Procedure: THORACIC ASCENDING ANEURYSM REPAIR (AAA);  Surgeon: Delight Ovens, MD;  Location: [facility] OR;  Service: Open Heart Surgery;  Laterality: N/A;  . Aortic valve replacement 08/10/2012    Procedure: AORTIC VALVE REPLACEMENT (AVR);  Surgeon: Delight Ovens, MD;  Location: [facility] OR;  Service: Open Heart Surgery;  Laterality: N/A;    No family history on file.  History  Substance Use Topics  . Smoking status: Former Smoker -- 0.0 packs/day for 2 years    Types: Cigarettes    Quit date: 12/30/1983  . Smokeless tobacco: Never Used  . Alcohol Use: No    OB History    Grav Para Term Preterm Abortions TAB SAB Ect Mult Living                  Review of Systems  Constitutional: Negative for fever and chills.  Musculoskeletal: Negative for myalgias.  Skin:  Positive for wound.  Neurological: Negative for weakness.    Allergies  Asa  Home Medications   Current Outpatient Rx  Name Route Sig Dispense Refill  . BIOTIN 5 MG PO CAPS Oral Take 1 capsule by mouth daily.    . COD LIVER OIL 1250-135 UNITS PO CAPS Oral Take 1 capsule by mouth daily.    Marland Kitchen FERROUS GLUCONATE 324 (38 FE) MG PO TABS Oral Take 1 tablet (324 mg total) by mouth 2 (two) times daily with a meal. 60 tablet 0  . FLUCONAZOLE 100 MG PO TABS Oral Take 1 tablet (100 mg total) by mouth daily. For 7 days 7 tablet 0  . FUROSEMIDE 40 MG PO TABS Oral Take 1 tablet (40 mg total) by mouth daily. For 5 days 5 tablet 0  . METOPROLOL TARTRATE 25 MG PO TABS Oral Take 1 tablet (25 mg total) by mouth 2 (two) times daily. 60 tablet 1  . POTASSIUM CHLORIDE CRYS ER 20 MEQ PO TBCR Oral Take 1 tablet (20 mEq total) by mouth daily. For 5 days 5 tablet 0  . ROSUVASTATIN CALCIUM 20 MG PO TABS Oral Take 20 mg by mouth every morning.    Marland Kitchen TRAMADOL HCL 50 MG PO TABS Oral Take 1-2 tablets (50-100 mg total) by mouth every 4 (four) hours as needed. 30 tablet 0  . MUPIROCIN CALCIUM  2 % EX CREA Topical Apply topically 2 (two) times daily. 15 g 0    BP 130/59  Pulse 105  Temp 99.6 F (37.6 C) (Oral)  Resp 20  SpO2 97%  Physical Exam  Constitutional: She appears well-developed and well-nourished.  HENT:  Head: Normocephalic.  Cardiovascular: Normal rate.   Pulmonary/Chest: Effort normal.  Neurological: She is alert.  Skin: Skin is warm.       ED Course  Procedures (including critical care time)  Labs Reviewed - No data to display No results found.   1. Wound dehiscence, surgical       MDM   Spoke with Dr. Tyrone Sage.  Ms Voisin's , Careers adviser.  He agrees with the plan for antibiotic ointment and followup as scheduled        Arman Filter, NP 08/21/12 2037

## 2012-08-21 NOTE — ED Notes (Signed)
Wound care completed, per Heather, NT, wound care was completed & bacitracin was applied

## 2012-08-21 NOTE — ED Notes (Signed)
Pt denies CP/SOB. 

## 2012-08-21 NOTE — ED Notes (Addendum)
Aug. 7th heart cath; 8/12/CABG; Currently, a suture under lt. Breast - and seems there is an odor. Pt. Also had an aortic valve replacement.

## 2012-08-22 NOTE — ED Provider Notes (Signed)
Medical screening examination/treatment/procedure(s) were performed by non-physician practitioner and as supervising physician I was immediately available for consultation/collaboration.  Kenzel Ruesch L Kielee Care, MD 08/22/12 0019 

## 2012-08-24 NOTE — H&P (Signed)
HPI:  General:  The patient presented today for cardiac catheterization.   She was having exertional chestof he pain and underwent nuclear stress test which showed anterolateral and inferolateral ischemia. Today she underwent cath showing 3 vessel ASCAD and ascending aortic root aneurysm and is now being admitted for IV Heparin, IV NTG gtt for BP control and CVTS consult.  ROS:  See HPI, A twelve system review was perfomed at today's visit. For pertinent positives and negatives see HPI.   Medical History: Hyperlipidemia, Heart Murmur - mild AI and dilated aortic root by echo 2010, Hyperthyroidism.   Family History:  Father: deceased Unknown Mother: deceased Lung Cancer, high cholesterol Paternal Grand Father: deceased Unknown Paternal Grand Mother: deceased Unknown Maternal Grand Father: deceased Unknown Maternal Grand Mother: deceased Stroke Maternal uncle: deceased Diabetes Mellitus, high cholesterol Maternal aunt: alive Diabetes Mellitus, high cholesterol Daughter(s): Hypothyroidism   Social History:  General:  History of smoking cigarettes: Former smoker, Quit in year 1963.  no Smoking.  no Tobacco Exposure.  no Alcohol.  Caffeine: yes, very little.  no Recreational drug use.  no Exercise.  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  Marital Status: widowed.  Children: 1.   Medications: MVI 1 tablet Once a day, Biotin 5000 5 MG Capsule 1 capsule Once a day, Crestor 20 MG Tablet 1 tablet Once a day, Cod Liver Oil 1250-135 UNIT Capsule 1 capsule Once a day, Medication List reviewed and reconciled with the patient   Allergies: Aspirin: Hurts Stomach: Side Effects .  Objective:  Vitals: Wt 217.4, Wt change -.8 lb, Ht 66, BMI 35.09, Pulse sitting 80, BP sitting 164/74.  Examination:  Cardiology, General:  GENERAL APPEARANCE: pleasant, NAD.  HEENT: unremarkable.  CAROTID UPSTROKE: normal, no bruit.  JVD: flat.  HEART SOUNDS: regular, normal S1, S2, no S3 or S4.    MURMUR: absent.  LUNGS: no rales or wheezes.  ABDOMEN: soft, non tender, positive bowel sounds, no masses felt.  EXTREMITIES: no leg edema.  PERIPHERAL PULSES: 2 plus bilateral.   Assessment:  1. Chest pain - 786.50 (Primary)  2. Abnormal cardiovascular function study - 794.30  3. 3 vessel ASCAD with 90% ostial diagonal #1, 70-80% distal LAD stneosis, 80% mid OM1 and 80% proximal and 70-80% mid RCA, normal LVF, dilated proximal ascending aorta  Plan: 1.  Admit to tele bed 2.  IV Heparin gtt 3.  IV NTG gtt for BP control 4.  CVTS consult for 3 vessel ASCAD and ascending aortic aneurysm

## 2012-09-03 ENCOUNTER — Other Ambulatory Visit: Payer: Self-pay | Admitting: Cardiothoracic Surgery

## 2012-09-03 DIAGNOSIS — Z951 Presence of aortocoronary bypass graft: Secondary | ICD-10-CM

## 2012-09-06 ENCOUNTER — Ambulatory Visit (INDEPENDENT_AMBULATORY_CARE_PROVIDER_SITE_OTHER): Payer: Self-pay | Admitting: Cardiothoracic Surgery

## 2012-09-06 ENCOUNTER — Ambulatory Visit
Admission: RE | Admit: 2012-09-06 | Discharge: 2012-09-06 | Disposition: A | Discharge status: Home / Self Care | Payer: Medicare Other | Source: Ambulatory Visit | Attending: Cardiothoracic Surgery | Admitting: Cardiothoracic Surgery

## 2012-09-06 VITALS — BP 118/72 | HR 96 | Resp 18 | Ht 66.0 in | Wt 214.0 lb

## 2012-09-06 DIAGNOSIS — Z952 Presence of prosthetic heart valve: Secondary | ICD-10-CM

## 2012-09-06 DIAGNOSIS — Z9889 Other specified postprocedural states: Secondary | ICD-10-CM

## 2012-09-06 DIAGNOSIS — Z09 Encounter for follow-up examination after completed treatment for conditions other than malignant neoplasm: Secondary | ICD-10-CM

## 2012-09-06 DIAGNOSIS — Z954 Presence of other heart-valve replacement: Secondary | ICD-10-CM

## 2012-09-06 DIAGNOSIS — Z951 Presence of aortocoronary bypass graft: Secondary | ICD-10-CM

## 2012-09-06 NOTE — Progress Notes (Signed)
[address]            Miranda Hutchinson [address]          [phone]     HPI:  08/10/2012  DATE OF DISCHARGE:  OPERATIVE REPORT  PREOPERATIVE DIAGNOSIS: Dilated aortic root with aortic insufficiency  and severe 3-vessel coronary artery disease.  POSTOPERATIVE DIAGNOSIS: Dilated aortic root with aortic insufficiency  and severe 3-vessel coronary artery disease.  PROCEDURE PERFORMED: Bental procedure with a bioprosthetic aortic  valve and 26 mm Valsalva graft and coronary artery bypass grafting x3  with left internal mammary to the left anterior descending coronary  artery, reverse saphenous vein graft to the obtuse marginal coronary  artery, reverse saphenous vein graft to the posterior descending  coronary artery.  Patient returns for routine postoperative follow-up having undergone a CABGx 3 and [place] by Dr. Tyrone Sage on 08/10/2012. Since hospital discharge the patient reports that she has developed a gurgling like sensation and cough at night, as well as feeling occasionally tired.She thinks her cough  might be due to reflux.She denies any shortness of breath, chest pain,  fever, or chills.   Current Outpatient Prescriptions  Medication Sig Dispense Refill  . Biotin (BIOTIN 5000) 5 MG CAPS Take 1 capsule by mouth daily.      Marland Kitchen Cod Liver Oil 1250-135 UNITS CAPS Take 1 capsule by mouth daily.      . ferrous gluconate (FERGON) 324 MG tablet Take 1 tablet (324 mg total) by mouth 2 (two) times daily with a meal.  60 tablet  0  . metoprolol tartrate (LOPRESSOR) 25 MG tablet Take 1 tablet (25 mg total) by mouth 2 (two) times daily.  60 tablet  1  . rosuvastatin (CRESTOR) 20 MG tablet Take 20 mg by mouth every morning.      Vital Signs: BP 118/72, HR 96, RR 18, O2 sat 98% on room air  Physical Exam: Cardiovascular:RRR, no murmur Pulmonary:Clear;no rales, wheezes, or rhonchi Abdomen:Soft, non tender, bowel sounds present Extremities:Trace RLE  edema.Hematoma inner right thigh. Wounds:Clean and dry. 2 silk sutures (previous chest tube sites) removed.   Diagnostic Tests: Dg Chest 2 View  09/06/2012  *RADIOLOGY REPORT*  Clinical Data: Status post CABG on August 13.  Pain and dizziness. Cough for 2 weeks.  CHEST - 2 VIEW  Comparison: 08/14/2012  Findings: Median sternotomy for aortic valve repair.  Patient minimally rotated left.  Moderate cardiomegaly.  Removal of right IJ Cordis sheath.  Minimal left-sided pleural thickening blunts the left costophrenic angle.  No fluid on the lateral. No pneumothorax. Mildly low lung volumes on the frontal, without congestive failure. Patchy areas of bibasilar atelectasis which are improved since the prior.  IMPRESSION:  1.  Cardiomegaly and low lung volumes with bibasilar atelectasis. No evidence of congestive failure or other acute superimposed process. 2.  Removal of right IJ Cordis sheath.   Original Report Authenticated By: Consuello Bossier, M.D.     Impression and Plan: Overall, she is continuing to make steady progress. There were no changes made to her medications. It should be noted that she is intolerant to aspirin (gi upset). She was able to tolerate ecasa 81 mg in the hospital. She has a follow up appointment to see Dr. Mayford Knife next week. May consider putting her on this.She was instructed she may participate in cardiac rehab. She was instructed to continue with sternal precautions i.e.  No lifting more than 10 pounds for the next 3-4 weeks. She is not taking narcotics. She was instructed she may begin driving short distances i.e. 30 minutes or less during the day. She may gradually increase her frequency and duration. I have instructed her to try an over the counter medicine such as Prilosec or Prevacid to see if this helps her night time symptoms.Her HGA1C pre op was 6.1. She was instructed to call her medical doctor for a follow up.She was seen and evaluated by Dr. Tyrone Sage as well. She will be  seen by him PRN.  Patient doing well post op, She is to be taking 81 mg asa this was discussed with her I have encouraged her to go to cardiac rehab   Miranda Ovens MD  Beeper [phone] Office [phone] 09/06/2012 2:27 PM

## 2012-09-06 NOTE — Patient Instructions (Addendum)
Take one enteric coated 81 mg Aspirin daily Start Cardiac Rehab   Endocarditis Information  You may be at risk for developing endocarditis since you have  an artificial heart valve  or a repaired heart valve. Endocarditis is an infection of the lining of the heart or heart valves.   Certain surgical and dental procedures may put you at risk,  such as teeth cleaning or other dental procedures or any surgery involving the respiratory, urinary, gastrointestinal tract, gallbladder or prostate.   Notify your doctor or dentist before having any invasive procedures. You will need to take antibiotics before certain procedures.   To prevent endocarditis, maintain good oral health. Seek prompt medical attention for any mouth/gum, skin or urinary tract infections.    Diabetes, Type 2, Am I At Risk? Diabetes is a lasting (chronic) disease. In type 2 diabetes, the pancreas does not make enough insulin, and the body does not respond normally to the insulin that is made. This type of diabetes was also previously called adult onset diabetes. About 90% of all those who have diabetes have type 2. It usually occurs after the age of 7, but can occur at any age.   People develop type 2 diabetes because they do not use insulin properly. Eventually, the pancreas cannot make enough insulin for the body's needs. Over time, the amount of glucose (sugar) in the blood increases. RISK FACTORS  Overweight - the more weight you have, the more resistant your cells become to insulin.   Family history - you are more likely to get diabetes if a parent or sibling has diabetes.   Race - certain races get diabetes more.   African Americans.   American Indians.   Asian Americans.   Hispanics.   Pacific Islander.   Inactive - exercise helps control weight and helps your cells be more sensitive to insulin.   Gestational diabetes - some women develop diabetes while they are pregnant. This goes away when they deliver.  However, they are 50-60% more likely to develop type 2 diabetes at a later time.   Having a baby over 9 pounds - a sign that you may have had gestational diabetes.   Age - the risk of diabetes goes up as you get older, especially after age 15.   High blood pressure (hypertension).  SYMPTOMS Many people have no signs or symptoms. Symptoms can be so mild that you might not even notice them. Some of these signs are:  Increased thirst.   Increased hunger.   Tiredness (fatigue).   Increased urination, especially at night.   Weight loss.   Blurred vision.   Sores that do not heal.  WHO SHOULD BE TESTED?  Anyone 45 years or older, especially if overweight, should consider getting tested.   If you are younger than 45, overweight, and have one or more of the risk factors, you should consider getting tested.  DIAGNOSIS  Fasting blood glucose (FBS). Usually, 2 are done.   FBS 101-125 mg/dl is considered pre-diabetes.   FBS 126 mg/dl or greater is considered diabetes.   2 hour Oral Glucose Tolerance Test (OGTT). This test is preformed by first having you not eat or drink for several hours. You are then given something sweet to drink and your blood glucose is measured fasting, at one hour and 2 hours. This test tells how well you are able to handle sugars or carbohydrates.   Fasting: 60-100 mg/dl.   1 hour: less than 200 mg/dl.   2  hours: less than 140 mg/dl.   A1c -A1c is a blood glucose test that gives and average of your blood glucose over 3 months. It is the accepted method to use to diagnose diabetes.   A1c 5.7-6.4% is considered pre-diabetes.   A1c 6.5% or greater is considered diabetes.  WHAT DOES IT MEAN TO HAVE PRE-DIABETES? Pre-diabetes means you are at risk for getting type 2 diabetes. Your blood glucose is higher than normal, but not yet high enough to diagnose diabetes. The good news is, if you have pre-diabetes you can reduce the risk of getting diabetes and even  return to normal blood glucose levels. With modest weight loss and moderate physical activity, you can delay or prevent type 2 diabetes.   PREVENTION You cannot do anything about race, age or family history, but you can lower your chances of getting diabetes. You can:    Exercise regularly and be active.   Reduce fat and calorie intake.   Make wise food choices as much as you can.   Reduce your intake of salt and alcohol.   Maintain a reasonable weight.   Keep blood pressure in an acceptable range. Take medication if needed.   Not smoke.   Maintain an acceptable cholesterol level (HDL, LDL, Triglycerides). Take medication if needed.  DOING MY PART: GETTING STARTED Making big changes in your life is hard, especially if you are faced with more than one change. You can make it easier by taking these steps:  Make a plan to change behavior.   Decide exactly what you will do and when you will do it.   Plan what you need to get ready.   Think about what might prevent you from reaching your goals.   Find family and friends who will support and encourage you.   Decide how you will reward yourself when you do what you have planned.   Your doctor, dietitian, or counselor can help you make a plan.  HERE ARE SOME OF THE AREAS YOU MAY WISH TO CHANGE TO REDUCE YOUR RISK OF DIABETES. If you are overweight or obese, choose sensible ways to get in shape. Even small amounts of weight loss, like 5-10 pounds, can help reduce the effects of insulin resistance and help blood glucose control. Diet  Avoid crash diets. Instead, eat less of the foods you usually have. Limit the amount of fat you eat.   Increase your physical activity. Aim for at least 30 minutes of exercise most days of the week.   Set a reasonable weight-loss goal, such as losing 1 pound a week. Aim for a long-term goal of losing 5-7% of your total body weight.   Make wise food choices most of the time.   What you eat has a big  impact on your health. By making wise food choices, you can help control your body weight, blood pressure, and cholesterol.   Take a hard look at the serving sizes of the foods you eat. Reduce serving sizes of meat, desserts, and foods high in fat. Increase your intake of fruits and vegetables.   Limit your fat intake to about 25% of your total calories. For example, if your food choices add up to about 2,000 calories a day, try to eat no more than 56 grams of fat. Your caregiver or a dietitian can help you figure out how much fat to have. You can check food labels for fat content too.   You may also want to reduce the number  of calories you have each day.   Keep a food log. Write down what you eat, how much you eat, and anything else that helps keep you on track.   When you meet your goal, reward yourself with a nonfood item or activity.  Exercise  Be physically active every day.   Keep and exercise log. Write down what exercise you did, for how long, and anything else that keeps you on track.   Regular exercise (like brisk walking) tackles several risk factors at once. It helps you lose weight, it keeps your cholesterol and blood pressure under control, and it helps your body use insulin. People who are physically active for 30 minutes a day, 5 days a week, reduced their risk of type 2 diabetes. If you are not very active, you should start slowly at first. Talk with your caregiver first about what kinds of exercise would be safe for you. Make a plan to increase your activity level with the goal of being active for at least 30 minutes a day, most days of the week.   Choose activities you enjoy. Here are some ways to work extra activity into your daily routine:   Take the stairs rather than an elevator or escalator.   Park at the far end of the lot and walk.   Get off the bus a few stops early and walk the rest of the way.   Walk or bicycle instead of drive whenever you can.   Medications Some people need medication to help control their blood pressure or cholesterol levels. If you do, take your medicines as directed. Ask your caregiver whether there are any medicines you can take to prevent type 2 diabetes. Document Released: 12/18/2003 Document Revised: 12/04/2011 Document Reviewed: 09/12/2009 [facility] Patient Information 2012 [place], [place].

## 2013-01-20 ENCOUNTER — Ambulatory Visit: Payer: Medicare Other | Admitting: Cardiothoracic Surgery

## 2013-01-27 ENCOUNTER — Encounter: Payer: Self-pay | Admitting: Cardiothoracic Surgery

## 2013-01-27 ENCOUNTER — Ambulatory Visit (INDEPENDENT_AMBULATORY_CARE_PROVIDER_SITE_OTHER): Payer: Medicare Other | Admitting: Cardiothoracic Surgery

## 2013-01-27 VITALS — BP 154/79 | HR 74 | Resp 16 | Ht 65.5 in | Wt 211.0 lb

## 2013-01-27 DIAGNOSIS — I351 Nonrheumatic aortic (valve) insufficiency: Secondary | ICD-10-CM

## 2013-01-27 DIAGNOSIS — Z09 Encounter for follow-up examination after completed treatment for conditions other than malignant neoplasm: Secondary | ICD-10-CM

## 2013-01-27 DIAGNOSIS — Z951 Presence of aortocoronary bypass graft: Secondary | ICD-10-CM

## 2013-01-27 DIAGNOSIS — I719 Aortic aneurysm of unspecified site, without rupture: Secondary | ICD-10-CM

## 2013-01-27 DIAGNOSIS — I2510 Atherosclerotic heart disease of native coronary artery without angina pectoris: Secondary | ICD-10-CM

## 2013-01-27 DIAGNOSIS — I359 Nonrheumatic aortic valve disorder, unspecified: Secondary | ICD-10-CM

## 2013-01-27 NOTE — Progress Notes (Signed)
              .                    [address]            [place] [address]          [phone]          08/10/2012  DATE OF DISCHARGE:  OPERATIVE REPORT  PREOPERATIVE DIAGNOSIS: Dilated aortic root with aortic insufficiency  and severe 3-vessel coronary artery disease.  POSTOPERATIVE DIAGNOSIS: Dilated aortic root with aortic insufficiency  and severe 3-vessel coronary artery disease.  PROCEDURE PERFORMED: Bental procedure with a bioprosthetic aortic  valve and 26 mm Valsalva graft and coronary artery bypass grafting x3  with left internal mammary to the left anterior descending coronary  artery, reverse saphenous vein graft to the obtuse marginal coronary  artery, reverse saphenous vein graft to the posterior descending  coronary artery.  Patient returns per Dr Mayford Knife to obtain release to go back to work,  having undergone a CABGx 3 and [place]  on 08/10/2012. She denies any shortness of breath, chest pain, fever, or chills. Mild edema in rt foot when sits a lot.    Current Outpatient Prescriptions  Medication Sig Dispense Refill  . Biotin (BIOTIN 5000) 5 MG CAPS Take 1 capsule by mouth daily.      Marland Kitchen Cod Liver Oil 1250-135 UNITS CAPS Take 1 capsule by mouth daily.      Marland Kitchen ezetimibe (ZETIA) 10 MG tablet Take 10 mg by mouth daily.      . metoprolol tartrate (LOPRESSOR) 25 MG tablet Take 1 tablet (25 mg total) by mouth 2 (two) times daily.  60 tablet  1  . rosuvastatin (CRESTOR) 20 MG tablet Take 20 mg by mouth every morning.       Patient stopped taking ASA, was d/c home on 81 mg   Vital Signs:    BP 154/79  Pulse 74  Resp 16  Ht 5' 5.5" (1.664 m)  Wt 211 lb (95.709 kg)  BMI 34.58 kg/m2  SpO2 99%   Physical Exam: Cardiovascular:RRR, no murmur, valve sounds crisp no ai Pulmonary:Clear;no rales, wheezes, or rhonchi Abdomen:Soft, non tender, bowel sounds present Extremities:Trace RLE edema.Hematoma inner right thigh. Wounds:Clean and dry.  Very  trace rt ankle edema  Diagnostic Tests: Was to have echo per Dr Mayford Knife, but has not been done. 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

## 2013-01-27 NOTE — Patient Instructions (Signed)
Endocarditis Information  You may be at risk for developing endocarditis since you have  an artificial heart valve  or a repaired heart valve. Endocarditis is an infection of the lining of the heart or heart valves.   Certain surgical and dental procedures may put you at risk,  such as teeth cleaning or other dental procedures or any surgery involving the respiratory, urinary, gastrointestinal tract, gallbladder or prostate.   Notify your doctor or dentist before having any invasive procedures. You will need to take antibiotics before certain procedures.   To prevent endocarditis, maintain good oral health. Seek prompt medical attention for any mouth/gum, skin or urinary tract infections.   May Return to work   Take 81 mg enteric coated ASA if stomach toerates

## 2013-07-05 IMAGING — CR DG CHEST 1V PORT
1 series · 1 of 1 positions shown · non-contrast
Comparison: 08/10/2012.

CLINICAL DATA: CABG.  Aneurysm repair.

PORTABLE CHEST - 1 VIEW

[AP]
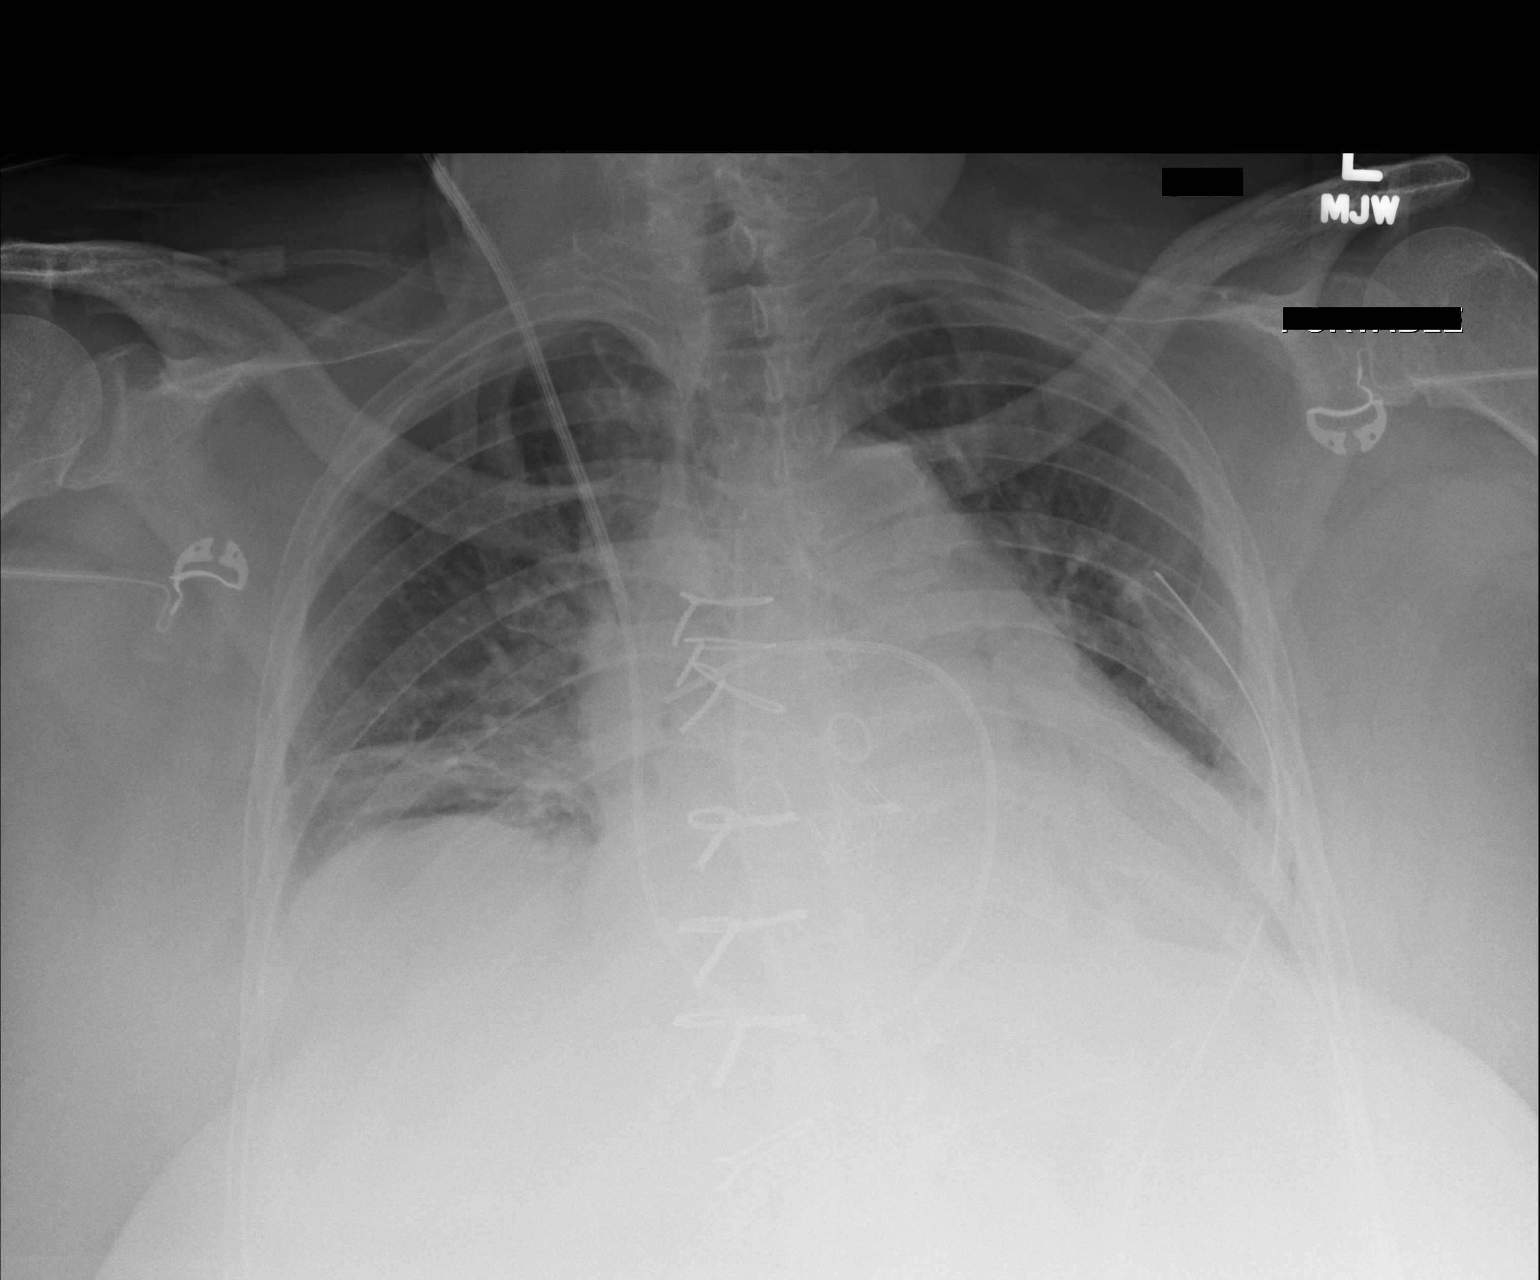

[1 of 1 positions shown; findings below may reference images not displayed]

FINDINGS: Endotracheal tube and nasogastric tube have been removed.
Right IJ Swan-Ganz catheter tip projects over the right pulmonary
artery.  Mediastinal drains and left chest tube are in place.
Lungs are low in volume with mild residual interstitial prominence
and bibasilar air space disease.  Overall aeration has improved
somewhat from 08/10/2012.  No pneumothorax.  No definite pleural
fluid.
IMPRESSION: Low lung volumes with mild diffuse interstitial prominence and
bibasilar air space disease, favoring improving edema with
bibasilar atelectasis.

## 2013-07-06 IMAGING — CR DG CHEST 1V PORT
1 series · 1 of 1 positions shown · non-contrast
Comparison: Yesterday

CLINICAL DATA: Aortic aneurysm

PORTABLE CHEST - 1 VIEW

[AP]
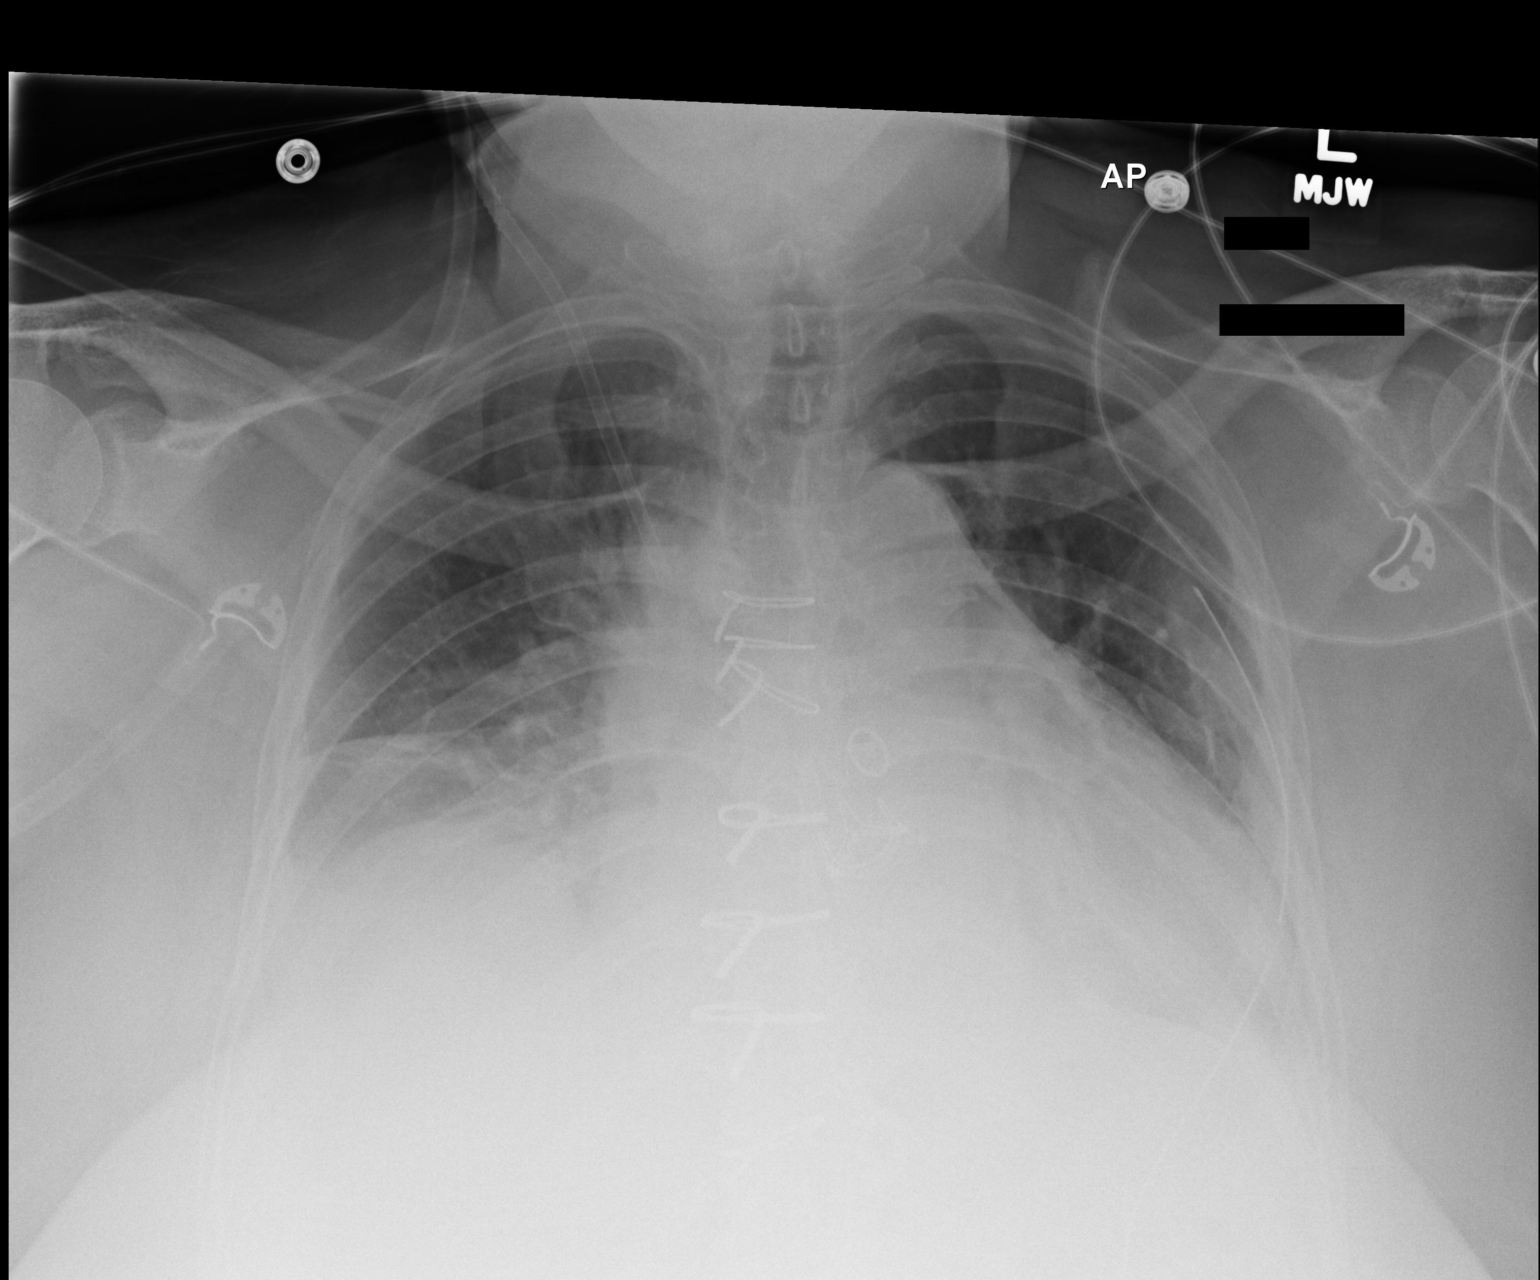

[1 of 1 positions shown; findings below may reference images not displayed]

FINDINGS: Swan-Ganz catheter removed with the introducer left in
place.  Stable left chest tube without pneumothorax.  Low volumes.
Bibasilar opacities stable.  Cardiac silhouette remains prominent
likely due to low volumes. Stable mild vascular congestion.
IMPRESSION: No pneumothorax.  No significant change in bibasilar atelectasis.
Stable mild vascular congestion.

## 2013-07-07 IMAGING — CR DG CHEST 1V PORT
1 series · 1 of 1 positions shown · non-contrast
Comparison: 08/12/2012

CLINICAL DATA: Coronary artery disease, thoracic aortic aneurysm,
and aortic insufficiency.  Postop.

PORTABLE CHEST - 1 VIEW

[AP]
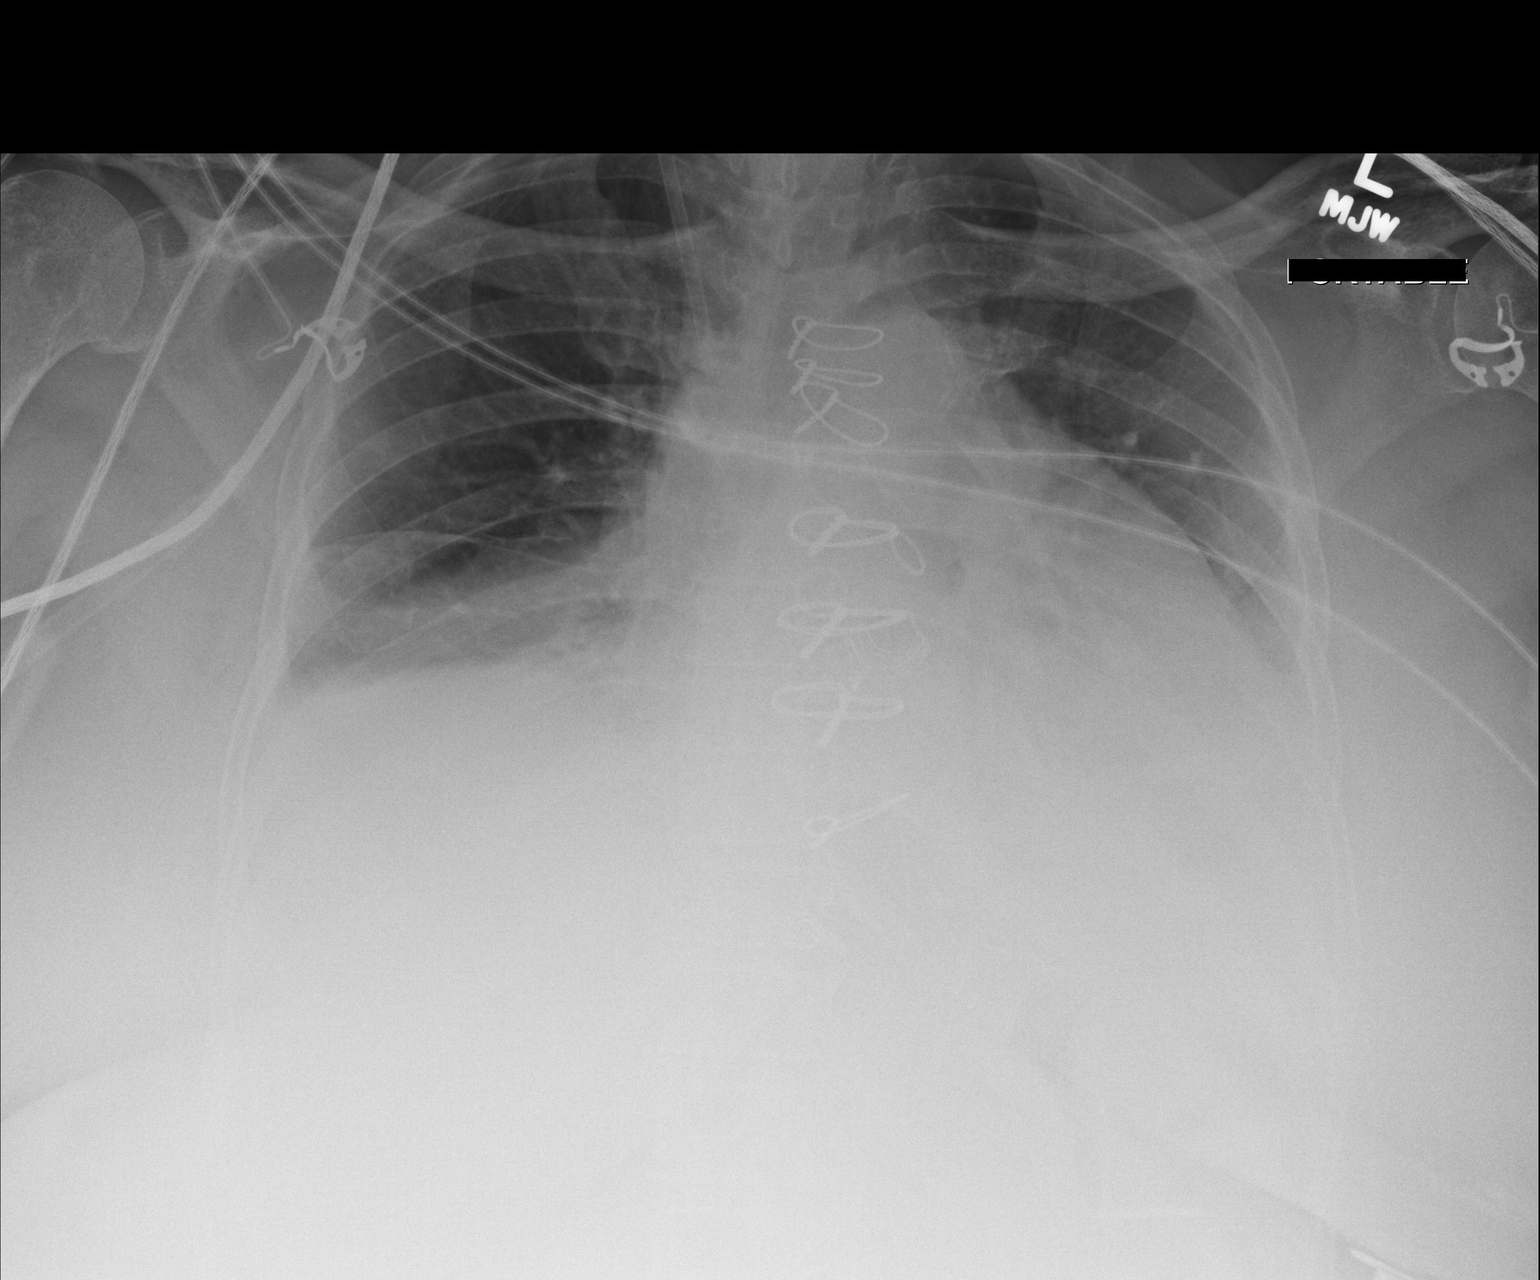

[1 of 1 positions shown; findings below may reference images not displayed]

FINDINGS: Left chest tube has been removed.  No pneumothorax
identified.  Right jugular Cordis remains in place.  Low lung
volumes are again seen.  Small bilateral pleural effusions and
bibasilar atelectasis show no significant change.  Cardiomegaly
stable.
IMPRESSION: 1.  No pneumothorax identified following chest tube removal.
2.  No significant change in bibasilar atelectasis and small
bilateral pleural effusions.

## 2013-12-02 ENCOUNTER — Ambulatory Visit: Payer: Medicare Other | Admitting: Cardiology

## 2013-12-08 ENCOUNTER — Encounter: Payer: Self-pay | Admitting: General Surgery

## 2013-12-08 DIAGNOSIS — I2510 Atherosclerotic heart disease of native coronary artery without angina pectoris: Secondary | ICD-10-CM

## 2013-12-09 ENCOUNTER — Ambulatory Visit: Payer: Self-pay | Admitting: Cardiology

## 2013-12-27 ENCOUNTER — Encounter: Payer: Self-pay | Admitting: Cardiology

## 2014-01-02 ENCOUNTER — Telehealth: Payer: Self-pay | Admitting: General Surgery

## 2014-01-02 ENCOUNTER — Encounter: Payer: Self-pay | Admitting: General Surgery

## 2014-01-02 DIAGNOSIS — E7800 Pure hypercholesterolemia, unspecified: Secondary | ICD-10-CM

## 2014-01-02 NOTE — Telephone Encounter (Signed)
LEtter sent to pt to make aware.

## 2014-01-02 NOTE — Telephone Encounter (Signed)
Message copied by Lily Kocher on Mon Jan 02, 2014  4:06 PM ------      Message from: Bishop Limbo      Created: Fri Dec 30, 2013  2:14 PM       Patient wasn't taking Crestor daily as prescribed per Dr. Moreen Fowler.  Patient is to make sure he takes this every day, and recheck lipid panel and hepatic panel with [place] in 4 weeks.  LDL 235, non-HDL 268.              Please notify patient to take Crestor daily, and set up a lipid panel and hepatic panel here for 4 weeks.  Thanks. ------

## 2014-01-06 ENCOUNTER — Ambulatory Visit: Payer: Self-pay | Admitting: Cardiology

## 2014-01-30 ENCOUNTER — Other Ambulatory Visit: Payer: Self-pay

## 2014-06-27 ENCOUNTER — Ambulatory Visit: Payer: Self-pay | Admitting: Neurology

## 2014-07-03 ENCOUNTER — Ambulatory Visit (INDEPENDENT_AMBULATORY_CARE_PROVIDER_SITE_OTHER): Payer: Commercial Managed Care - HMO | Admitting: Neurology

## 2014-07-03 ENCOUNTER — Encounter: Payer: Self-pay | Admitting: Neurology

## 2014-07-03 ENCOUNTER — Other Ambulatory Visit (HOSPITAL_COMMUNITY): Payer: Self-pay | Admitting: Neurology

## 2014-07-03 VITALS — BP 160/86 | HR 69 | Resp 16 | Ht 65.5 in | Wt 209.0 lb

## 2014-07-03 DIAGNOSIS — H538 Other visual disturbances: Secondary | ICD-10-CM

## 2014-07-03 DIAGNOSIS — R51 Headache: Secondary | ICD-10-CM

## 2014-07-03 DIAGNOSIS — R519 Headache, unspecified: Secondary | ICD-10-CM | POA: Insufficient documentation

## 2014-07-03 DIAGNOSIS — R42 Dizziness and giddiness: Secondary | ICD-10-CM | POA: Diagnosis not present

## 2014-07-03 LAB — SEDIMENTATION RATE: SED RATE: 1 mm/h (ref 0–22)

## 2014-07-03 NOTE — Addendum Note (Signed)
Addended by: Melissa Noon C on: 07/03/2014 01:50 PM   Modules accepted: Orders

## 2014-07-03 NOTE — Progress Notes (Signed)
NEUROLOGY CONSULTATION NOTE  Miranda Hutchinson MRN: [id number] DOB: October 28, 1946  Referring provider: Dr. Antony Contras Primary care provider: Dr. Antony Contras  Reason for consult:  Recurrent episodes of dizziness  Dear Dr Moreen Fowler:  Thank you for your kind referral of Miranda Hutchinson for consultation of the above symptoms. Although her history is well known to you, please allow me to reiterate it for the purpose of our medical record. Records and images were personally reviewed where available.  HISTORY OF PRESENT ILLNESS: This is a pleasant 68 year old right-handed woman with vascular risk factors including hypertension, hyperlipidemia, CAD s/p CABG, s/p aortic valve replacement, presenting for recurrent episodes of dizziness that started in December 2014.  On further questioning, she describes the dizziness as a "fluttering in my eyes, things are not as clear as they normally are." Sometimes she would get horizontal diplopia. She denies any vertigo or floating sensation, but feels a little unbalanced during the episodes, that usually last 10-15 minutes, occurring around once a week, mostly when standing up or moving around, however can occur when just lying down.  She can usually continue on with her activities, however when these have happened when she is driving and she would have to pull over until symptoms resolve.  She denies any associated nausea, vomiting, focal numbness/tingling/weakness. She feels that being out in the sun may trigger them. She denies any associated headaches, but does report new onset daily headaches when she wakes up for the past 2 weeks over the right temporal region, with mild throbbing 1-2/10 pain lasting half a day with some photophobia.  She does not take any medications for the headaches.    She denies any dysarthria, dysphagia, neck/back pain, focal numbness/tingling/weakness, bowel/bladder dysfunction. She had some wax buildup in her ears last December, but  despite treatment of this, symptoms continued.  She denies any tinnitus or hearing loss.  She has some right wrist pain from a fall 2 months ago, no head injury.  PAST MEDICAL HISTORY: Past Medical History  Diagnosis Date  . Dyslipidemia   . Aortic insufficiency   . Heart murmur   . Exertional dyspnea   . Hyperlipidemia   . Hyperthyroidism   . Hypothyroidism     PAST SURGICAL HISTORY: Past Surgical History  Procedure Laterality Date  . Tonsillectomy      "I was a little child"  . Abdominal hysterectomy  1990's  . Cardiac catheterization  08/04/2012  . Coronary artery bypass graft  08/10/2012    Procedure: CORONARY ARTERY BYPASS GRAFTING (CABG);  Surgeon: Grace Isaac, MD;  Location: [place];  Service: Open Heart Surgery;  Laterality: N/A;  . Thoracic aortic aneurysm repair  08/10/2012    Procedure: THORACIC ASCENDING ANEURYSM REPAIR (AAA);  Surgeon: Grace Isaac, MD;  Location: [place];  Service: Open Heart Surgery;  Laterality: N/A;  . Aortic valve replacement  08/10/2012    Procedure: AORTIC VALVE REPLACEMENT (AVR);  Surgeon: Grace Isaac, MD;  Location: [place];  Service: Open Heart Surgery;  Laterality: N/A;    MEDICATIONS: Current Outpatient Prescriptions on File Prior to Visit  Medication Sig Dispense Refill  . metoprolol tartrate (LOPRESSOR) 25 MG tablet Take 1 tablet (25 mg total) by mouth 2 (two) times daily.  60 tablet  1  . Multiple Vitamin (MULTIVITAMIN) tablet Take 1 tablet by mouth daily.      . rosuvastatin (CRESTOR) 20 MG tablet Take 20 mg by mouth every morning.  No current facility-administered medications on file prior to visit.    ALLERGIES: Allergies  Allergen Reactions  . Asa [Aspirin] Nausea Only and Other (See Comments)    "hurts my stomach"    FAMILY HISTORY: Family History  Problem Relation Age of Onset  . Lung cancer Mother     SOCIAL HISTORY: History   Social History  . Marital Status: Married    Spouse Name: N/A    Number  of Children: N/A  . Years of Education: N/A   Occupational History  . Not on file.   Social History Main Topics  . Smoking status: Former Smoker -- 0.05 packs/day for 2 years    Types: Cigarettes    Quit date: 12/30/1983  . Smokeless tobacco: Never Used  . Alcohol Use: No  . Drug Use: No  . Sexual Activity: No   Other Topics Concern  . Not on file   Social History Narrative  . No narrative on file    REVIEW OF SYSTEMS: Constitutional: No fevers, chills, or sweats, no generalized fatigue, change in appetite Eyes: as above Ear, nose and throat: No hearing loss, ear pain, nasal congestion, sore throat Cardiovascular: No chest pain, palpitations Respiratory:  No shortness of breath at rest or with exertion, wheezes GastrointestinaI: No nausea, vomiting, diarrhea, abdominal pain, fecal incontinence Genitourinary:  No dysuria, urinary retention or frequency Musculoskeletal:  No neck pain, back pain Integumentary: No rash, pruritus, skin lesions Neurological: as above Psychiatric: No depression, insomnia, anxiety Endocrine: No palpitations, fatigue, diaphoresis, mood swings, change in appetite, change in weight, increased thirst Hematologic/Lymphatic:  No anemia, purpura, petechiae. Allergic/Immunologic: no itchy/runny eyes, nasal congestion, recent allergic reactions, rashes  PHYSICAL EXAM: Filed Vitals:   07/03/14 0914  BP: 160/86  Pulse: 69  Resp:    General: No acute distress Head:  Normocephalic/atraumatic, no temporal artery tenderness/ropiness Eyes: Fundoscopic exam shows bilateral sharp discs, no vessel changes, exudates, or hemorrhages Neck: supple, no paraspinal tenderness, full range of motion Back: No paraspinal tenderness Heart: regular rate and rhythm Lungs: Clear to auscultation bilaterally. Vascular: No carotid bruits. Skin/Extremities: No rash, no edema Neurological Exam: Mental status: alert and oriented to person, place, and time, no dysarthria or  aphasia, Fund of knowledge is appropriate.  Recent and remote memory are intact.  Attention and concentration are normal.    Able to name objects and repeat phrases. Cranial nerves: CN I: not tested CN II: pupils equal, round and reactive to light, visual fields intact, fundi unremarkable. CN III, IV, VI:  full range of motion, no nystagmus, no ptosis CN V: facial sensation intact CN VII: upper and lower face symmetric CN VIII: hearing intact to finger rub CN IX, X: gag intact, uvula midline CN XI: sternocleidomastoid and trapezius muscles intact CN XII: tongue midline Bulk & Tone: normal, no fasciculations. Motor: 5/5 throughout with no pronator drift. Sensation: decreased pin on the left foot up to ankle, decreased vibration sense in great toes bilaterally. Otherwise intact to all modalities on both UE, intact to light touch, cold, joint position sense in both LE.  No extinction to double simultaneous stimulation.  Romberg test negative Deep Tendon Reflexes: +1 on both UE, +2 on both LE, no ankle clonus Plantar responses: downgoing bilaterally Cerebellar: no incoordination on finger to nose, heel to shin. No dysdiadochokinesia Gait: narrow-based and steady, able to tandem walk adequately. Tremor: none  IMPRESSION: This is a pleasant 68 year old right-handed woman with vascular risk factors including hypertension, hyperlipidemia, CAD s/p CABG,  s/p aortic valve replacement, presenting with recurrent episodes that she describes as dizziness, however on further questioning dizziness is not vertiginous in nature, and appears to possibly be more visual with described sensation of fluttering and blurred vision lasting 10-15 minutes with some feeling of imbalance. She also has new onset headaches.  MRI brain without contrast will be ordered to assess for underlying structural abnormality.  Check ESR.  The episodic symptoms are concerning for hypoperfusion, such as vertebrobasilar insufficiency, versus  sensory ataxia from peripheral neuropathy. MRA brain without contrast and carotid dopplers, as well as neuropathy labs will be ordered. She will follow-up after the tests.  Thank you for allowing me to participate in the care of this patient. Please do not hesitate to call for any questions or concerns.   Ellouise Newer, M.D.  CC: Dr. Moreen Fowler

## 2014-07-03 NOTE — Patient Instructions (Addendum)
1. MRI brain without contrast 2. Bloodwork for ESR, TSH, vitamin B12, SPEP/IFE 3. MRA head without contrast 4. Carotid dopplers

## 2014-07-04 ENCOUNTER — Ambulatory Visit (HOSPITAL_COMMUNITY): Payer: Medicare HMO | Attending: Family Medicine

## 2014-07-04 LAB — TSH: TSH: 0.497 u[IU]/mL (ref 0.350–4.500)

## 2014-07-04 LAB — VITAMIN B12: VITAMIN B 12: 500 pg/mL (ref 211–911)

## 2014-07-05 LAB — IMMUNOFIXATION ELECTROPHORESIS
IgA: 249 mg/dL (ref 69–380)
IgG (Immunoglobin G), Serum: 1200 mg/dL (ref 690–1700)
IgM, Serum: 60 mg/dL (ref 52–322)
TOTAL PROTEIN, SERUM ELECTROPHOR: 7 g/dL (ref 6.0–8.3)

## 2014-07-05 LAB — PROTEIN ELECTROPHORESIS, SERUM
ALPHA-1-GLOBULIN: 3.9 % (ref 2.9–4.9)
Albumin ELP: 57.5 % (ref 55.8–66.1)
Alpha-2-Globulin: 10.1 % (ref 7.1–11.8)
Beta 2: 6.3 % (ref 3.2–6.5)
Beta Globulin: 5.6 % (ref 4.7–7.2)
GAMMA GLOBULIN: 16.6 % (ref 11.1–18.8)
Total Protein, Serum Electrophoresis: 7 g/dL (ref 6.0–8.3)

## 2014-07-06 ENCOUNTER — Telehealth: Payer: Self-pay | Admitting: Family Medicine

## 2014-07-06 NOTE — Telephone Encounter (Signed)
Spoke with patient notified of normal results.

## 2014-07-06 NOTE — Telephone Encounter (Signed)
Message copied by Thurmon Fair on Thu Jul 06, 2014  8:31 AM ------      Message from: Cameron Sprang      Created: Wed Jul 05, 2014  5:54 PM       pls let her know bloodwork normal, thanks ------

## 2014-07-19 ENCOUNTER — Ambulatory Visit (HOSPITAL_COMMUNITY): Payer: Medicare HMO

## 2014-07-19 ENCOUNTER — Ambulatory Visit (HOSPITAL_COMMUNITY): Admission: RE | Admit: 2014-07-19 | Payer: Medicare HMO | Source: Ambulatory Visit

## 2014-08-01 ENCOUNTER — Encounter: Payer: Self-pay | Admitting: Neurology

## 2014-08-10 ENCOUNTER — Ambulatory Visit: Payer: Commercial Managed Care - HMO | Admitting: Neurology

## 2014-08-14 ENCOUNTER — Telehealth: Payer: Self-pay | Admitting: *Deleted

## 2014-08-14 NOTE — Telephone Encounter (Signed)
Patient canceled follow up appointment she has not had her imaging done yet. Patient will reschedule after this is done

## 2015-04-24 DIAGNOSIS — Z0279 Encounter for issue of other medical certificate: Secondary | ICD-10-CM

## 2015-08-08 ENCOUNTER — Other Ambulatory Visit: Payer: Self-pay | Admitting: Gynecology

## 2015-08-09 LAB — CYTOLOGY - PAP

## 2015-08-10 ENCOUNTER — Other Ambulatory Visit: Payer: Self-pay | Admitting: Gynecology

## 2015-08-10 DIAGNOSIS — R928 Other abnormal and inconclusive findings on diagnostic imaging of breast: Secondary | ICD-10-CM

## 2015-08-17 ENCOUNTER — Ambulatory Visit
Admission: RE | Admit: 2015-08-17 | Discharge: 2015-08-17 | Disposition: A | Discharge status: Home / Self Care | Payer: PPO | Source: Ambulatory Visit | Attending: Gynecology | Admitting: Gynecology

## 2015-08-17 DIAGNOSIS — R928 Other abnormal and inconclusive findings on diagnostic imaging of breast: Secondary | ICD-10-CM

## 2015-08-30 ENCOUNTER — Encounter: Payer: Self-pay | Admitting: Cardiology

## 2015-09-12 ENCOUNTER — Ambulatory Visit (INDEPENDENT_AMBULATORY_CARE_PROVIDER_SITE_OTHER): Payer: PPO | Admitting: Endocrinology

## 2015-09-12 ENCOUNTER — Encounter: Payer: Self-pay | Admitting: Endocrinology

## 2015-09-12 VITALS — BP 137/84 | HR 67 | Temp 98.1°F | Ht 66.0 in | Wt 212.0 lb

## 2015-09-12 DIAGNOSIS — E042 Nontoxic multinodular goiter: Secondary | ICD-10-CM

## 2015-09-12 DIAGNOSIS — E0590 Thyrotoxicosis, unspecified without thyrotoxic crisis or storm: Secondary | ICD-10-CM | POA: Insufficient documentation

## 2015-09-12 NOTE — Patient Instructions (Addendum)
Let's recheck the thyroid nuclear medicine test, and we'll check the ultrasound also this time.  Please return 1-2 days after, and we'll discuss more.

## 2015-09-12 NOTE — Progress Notes (Signed)
Subjective:    Patient ID: Miranda Hutchinson, female    DOB: 20-Oct-1946, 69 y.o.   MRN: [id number]  HPI Pt reports he was dx'ed with a multinodular goiter in 2012.  she has never been on therapy for this.  she has never had XRT to the anterior neck, or thyroid surgery.  she does not consume kelp or any other prescribed or non-prescribed thyroid medication.  she has never been on amiodarone.  She now reports slightly brittle hair, and assoc dry skin.   Past Medical History  Diagnosis Date  . Dyslipidemia   . Aortic insufficiency   . Heart murmur   . Exertional dyspnea   . Hyperlipidemia   . Hyperthyroidism   . Hypothyroidism     Past Surgical History  Procedure Laterality Date  . Tonsillectomy      "I was a little child"  . Abdominal hysterectomy  1990's  . Cardiac catheterization  08/04/2012  . Coronary artery bypass graft  08/10/2012    Procedure: CORONARY ARTERY BYPASS GRAFTING (CABG);  Surgeon: Grace Isaac, MD;  Location: [place];  Service: Open Heart Surgery;  Laterality: N/A;  . Thoracic aortic aneurysm repair  08/10/2012    Procedure: THORACIC ASCENDING ANEURYSM REPAIR (AAA);  Surgeon: Grace Isaac, MD;  Location: [place];  Service: Open Heart Surgery;  Laterality: N/A;  . Aortic valve replacement  08/10/2012    Procedure: AORTIC VALVE REPLACEMENT (AVR);  Surgeon: Grace Isaac, MD;  Location: [place];  Service: Open Heart Surgery;  Laterality: N/A;    Social History   Social History  . Marital Status: Widowed    Spouse Name: N/A  . Number of Children: N/A  . Years of Education: N/A   Occupational History  . Not on file.   Social History Main Topics  . Smoking status: Former Smoker -- 0.05 packs/day for 2 years    Types: Cigarettes    Quit date: 12/30/1983  . Smokeless tobacco: Never Used  . Alcohol Use: No  . Drug Use: No  . Sexual Activity: No   Other Topics Concern  . Not on file   Social History Narrative    Current Outpatient Prescriptions on  File Prior to Visit  Medication Sig Dispense Refill  . rosuvastatin (CRESTOR) 20 MG tablet Take 20 mg by mouth every morning.     No current facility-administered medications on file prior to visit.    Allergies  Allergen Reactions  . Asa [Aspirin] Nausea Only and Other (See Comments)    "hurts my stomach"    Family History  Problem Relation Age of Onset  . Lung cancer Mother   . Thyroid disease Mother     BP 137/84 mmHg  Pulse 67  Temp(Src) 98.1 F (36.7 C) (Oral)  Ht 5\' 6"  (1.676 m)  Wt 212 lb (96.163 kg)  BMI 34.23 kg/m2  SpO2 91%   Review of Systems denies weight loss, headache, hoarseness, double vision, palpitations, sob, diarrhea, polyuria, muscle weakness, excessive diaphoresis, tremor, anxiety, heat intolerance, and easy bruising.  She has fatigue and rhinorrhea.    Objective:   Physical Exam VS: see vs page GEN: no distress HEAD: head: no deformity eyes: no periorbital swelling, no proptosis external nose and ears are normal mouth: no lesion seen NECK: thyroid is 5-10 x normal size, (R>>L), with multinodular surface.  CHEST WALL: no deformity LUNGS:  Clear to auscultation CV: reg rate and rhythm, no murmur.  MUSCULOSKELETAL: muscle bulk and strength are  grossly normal.  no obvious joint swelling.  gait is normal and steady EXTEMITIES: no deformity.  no ulcer on the feet.  feet are of normal color and temp.  no edema PULSES: dorsalis pedis intact bilat.  no carotid bruit NEURO:  cn 2-12 grossly intact.   readily moves all 4's.  sensation is intact to touch on the feet.  No tremor.  SKIN:  Normal texture and temperature.  No rash or suspicious lesion is visible.   NODES:  None palpable at the neck PSYCH: alert, well-oriented.  Does not appear anxious nor depressed.    Radiol: thyroid nuc med (02/13/11): Scan findings are nonspecific but suggest early toxic multinodular goiter in the setting of hyperthyroidism.   neck CT (07/12/11): The right thyroid is  markedly enlarged, measuring 6.6 x 3.6 x 4.2 cm. It is heterogeneous in attenuation with a complex pattern of scattered areas of low attenuation. The thyroid isthmus is thickened to 15 mm. The left lobe of the thyroid is similarly heterogeneous, but smaller, measuring 15 x 29 x 30 2 mm.  Lab Results  Component Value Date   TSH 0.497 07/03/2014  outside test results are reviewed: TSH=0.32  I have reviewed outside records: pt was noted to have a slightly suppressed TSH, and was ref here.      Assessment & Plan:  Multinodular goiter, usually hereditary Hyperthyroidism, new, mild.  The next step is to see if the largest nodule(s) is/are hyperfunctioning.  We had a preliminary discussion about rx options.     Patient is advised the following: Patient Instructions  Let's recheck the thyroid nuclear medicine test, and we'll check the ultrasound also this time.  Please return 1-2 days after, and we'll discuss more.

## 2015-09-26 ENCOUNTER — Ambulatory Visit
Admission: RE | Admit: 2015-09-26 | Discharge: 2015-09-26 | Disposition: A | Discharge status: Home / Self Care | Payer: PPO | Source: Ambulatory Visit | Attending: Endocrinology | Admitting: Endocrinology

## 2015-09-26 DIAGNOSIS — E042 Nontoxic multinodular goiter: Secondary | ICD-10-CM

## 2015-09-26 DIAGNOSIS — E0590 Thyrotoxicosis, unspecified without thyrotoxic crisis or storm: Secondary | ICD-10-CM

## 2015-10-04 ENCOUNTER — Ambulatory Visit: Payer: Self-pay | Admitting: Cardiology

## 2015-10-15 NOTE — Progress Notes (Signed)
Cardiology Office Note   Date:  10/16/2015   ID:  Miranda Hutchinson, DOB [birth year], MRN [id number]  PCP:  Gara Kroner, MD    Chief Complaint  Patient presents with  . Coronary Artery Disease  . Hypertension  . Hyperlipidemia      History of Present Illness: Miranda Hutchinson is a 69 y.o. female who presents for followup of severe 3 vessel ASCAD s/p CABG (LIMA to LAD, SVG to OM, SVG to PDA), ascending aortic root aneursym with AI s/p Bentall procedure 07/2012 and dyslipidemia.  She is doing well.  She denies any SOB, DOE, LE edema, palpitations or syncope.  She complains of a dry cough.  She complains that she is having some dizzy spells that can occur with sitting or moving around.  She describes it as a difficulty focusing and things are blurry but she does not feel like she is going to pass out.  She has been having some exertional fatigue and also sharp pains over her left breast that are new.    Past Medical History  Diagnosis Date  . Dyslipidemia   . Aortic insufficiency   . Heart murmur   . Exertional dyspnea   . Hyperlipidemia   . Hyperthyroidism   . Hypothyroidism     Past Surgical History  Procedure Laterality Date  . Tonsillectomy      "I was a little child"  . Abdominal hysterectomy  1990's  . Cardiac catheterization  08/04/2012  . Coronary artery bypass graft  08/10/2012    Procedure: CORONARY ARTERY BYPASS GRAFTING (CABG);  Surgeon: Grace Isaac, MD;  Location: [place];  Service: Open Heart Surgery;  Laterality: N/A;  . Thoracic aortic aneurysm repair  08/10/2012    Procedure: THORACIC ASCENDING ANEURYSM REPAIR (AAA);  Surgeon: Grace Isaac, MD;  Location: [place];  Service: Open Heart Surgery;  Laterality: N/A;  . Aortic valve replacement  08/10/2012    Procedure: AORTIC VALVE REPLACEMENT (AVR);  Surgeon: Grace Isaac, MD;  Location: [place];  Service: Open Heart Surgery;  Laterality: N/A;     Current Outpatient Prescriptions    Medication Sig Dispense Refill  . losartan (COZAAR) 25 MG tablet Take 25 mg by mouth daily.    . metoprolol tartrate (LOPRESSOR) 25 MG tablet Take 25 mg by mouth 2 (two) times daily.    . rosuvastatin (CRESTOR) 20 MG tablet Take 20 mg by mouth daily.     No current facility-administered medications for this visit.    Allergies:   Asa    Social History:  The patient  reports that she quit smoking about 31 years ago. Her smoking use included Cigarettes. She has a .1 pack-year smoking history. She has never used smokeless tobacco. She reports that she does not drink alcohol or use illicit drugs.   Family History:  The patient's family history includes Lung cancer in her mother; Thyroid disease in her mother.    ROS:  Please see the history of present illness.   Otherwise, review of systems are positive for none.   All other systems are reviewed and negative.    PHYSICAL EXAM: VS:  BP 140/74 mmHg  Pulse 68  Ht 5' 5.5" (1.664 m)  Wt 212 lb 6.4 oz (96.344 kg)  BMI 34.80 kg/m2  SpO2 97% , BMI Body mass index is 34.8 kg/(m^2). GEN: Well nourished, well developed, in no acute  distress HEENT: normal Neck: no JVD, carotid bruits, or masses Cardiac: RRR; no gallops,no edema.  2/6 SM at RUSB  Respiratory:  clear to auscultation bilaterally, normal work of breathing GI: soft, nontender, nondistended, + BS MS: no deformity or atrophy Skin: warm and dry, no rash Neuro:  Strength and sensation are intact Psych: euthymic mood, full affect   EKG:  EKG is ordered today. The ekg ordered today demonstrates NSR with RBBB   Recent Labs: No results found for requested labs within last 365 days.    Lipid Panel No results found for: CHOL, TRIG, HDL, CHOLHDL, VLDL, LDLCALC, LDLDIRECT    Wt Readings from Last 3 Encounters:  10/16/15 212 lb 6.4 oz (96.344 kg)  09/12/15 212 lb (96.163 kg)  07/03/14 209 lb (94.802 kg)        ASSESSMENT AND PLAN:  1.  Severe 3 vessel ASCAD s/p CABG  (LIMA to LAD, SVG to OM, SVG to PDA) with no angina but is having exertional fatigue.  I will get a stress myoview to assess for ischemia.   2.  Ascending thoracic aortic aneurysm with AI s/p Bentall procedure 2013 - followed by Dr. Servando Snare.  She is intolerant to ASA. 3.  Dyslipidemia on statin.  Check FLP and ALT. 4.  HTN - controlled on ARB and BB 5.  Heart murmur - I will get a 2D echo to assess   Current medicines are reviewed at length with the patient today.  The patient does not have concerns regarding medicines.  The following changes have been made:  no change  Labs/ tests ordered today: See above Assessment and Plan No orders of the defined types were placed in this encounter.     Disposition:   FU with me in 1 year  Signed, Sueanne Margarita, MD  10/16/2015 8:56 AM    [facility] [address], [place], [place]  [address] Phone: [phone]; Fax: [phone]

## 2015-10-16 ENCOUNTER — Ambulatory Visit (INDEPENDENT_AMBULATORY_CARE_PROVIDER_SITE_OTHER): Payer: PPO | Admitting: Cardiology

## 2015-10-16 VITALS — BP 140/74 | HR 68 | Ht 65.5 in | Wt 212.4 lb

## 2015-10-16 DIAGNOSIS — E7800 Pure hypercholesterolemia, unspecified: Secondary | ICD-10-CM

## 2015-10-16 DIAGNOSIS — I351 Nonrheumatic aortic (valve) insufficiency: Secondary | ICD-10-CM

## 2015-10-16 DIAGNOSIS — I77810 Thoracic aortic ectasia: Secondary | ICD-10-CM | POA: Diagnosis not present

## 2015-10-16 DIAGNOSIS — I10 Essential (primary) hypertension: Secondary | ICD-10-CM | POA: Diagnosis not present

## 2015-10-16 DIAGNOSIS — R079 Chest pain, unspecified: Secondary | ICD-10-CM

## 2015-10-16 DIAGNOSIS — R011 Cardiac murmur, unspecified: Secondary | ICD-10-CM

## 2015-10-16 DIAGNOSIS — I2510 Atherosclerotic heart disease of native coronary artery without angina pectoris: Secondary | ICD-10-CM

## 2015-10-16 DIAGNOSIS — I2583 Coronary atherosclerosis due to lipid rich plaque: Principal | ICD-10-CM

## 2015-10-16 LAB — LIPID PANEL
CHOLESTEROL: 180 mg/dL (ref 125–200)
HDL: 39 mg/dL — AB (ref >=46)
LDL CALC: 123 mg/dL (ref <130)
TRIGLYCERIDES: 90 mg/dL (ref <150)
Total CHOL/HDL Ratio: 4.6 Ratio (ref <=5.0)
VLDL: 18 mg/dL (ref <30)

## 2015-10-16 LAB — HEPATIC FUNCTION PANEL
ALT: 18 U/L (ref 6–29)
AST: 20 U/L (ref 10–35)
Albumin: 3.9 g/dL (ref 3.6–5.1)
Alkaline Phosphatase: 58 U/L (ref 33–130)
BILIRUBIN DIRECT: 0.1 mg/dL (ref <=0.2)
BILIRUBIN INDIRECT: 0.3 mg/dL (ref 0.2–1.2)
TOTAL PROTEIN: 6.7 g/dL (ref 6.1–8.1)
Total Bilirubin: 0.4 mg/dL (ref 0.2–1.2)

## 2015-10-16 NOTE — Patient Instructions (Signed)
Medication Instructions:  Your physician recommends that you continue on your current medications as directed. Please refer to the Current Medication list given to you today.   Labwork: TODAY: LFTs, LIPIDS  Testing/Procedures: Your physician has requested that you have an echocardiogram. Echocardiography is a painless test that uses sound waves to create images of your heart. It provides your doctor with information about the size and shape of your heart and how well your heart's chambers and valves are working. This procedure takes approximately one hour. There are no restrictions for this procedure.  Dr. Radford Pax recommends you have an [place].  Follow-Up: Your physician wants you to follow-up in: 1 year with Dr. Radford Pax. You will receive a reminder letter in the mail two months in advance. If you don't receive a letter, please call our office to schedule the follow-up appointment.   Any Other Special Instructions Will Be Listed Below (If Applicable).

## 2015-10-19 ENCOUNTER — Telehealth: Payer: Self-pay | Admitting: *Deleted

## 2015-10-19 DIAGNOSIS — E7800 Pure hypercholesterolemia, unspecified: Secondary | ICD-10-CM

## 2015-10-19 MED ORDER — ROSUVASTATIN CALCIUM 40 MG PO TABS: 40.0000 mg | ORAL_TABLET | Freq: Every day | ORAL | Status: DC

## 2015-10-19 NOTE — Telephone Encounter (Signed)
-----   Message from Sueanne Margarita, MD sent at 10/16/2015  8:51 PM EDT ----- LDL not at goal - increase crestor to 40mg  daily and recheck FLP and ALT in 6 weeks

## 2015-10-19 NOTE — Telephone Encounter (Signed)
Informed pt of lab results. Pt verbalized understanding. Scheduled labs for 12/7.

## 2015-10-25 ENCOUNTER — Encounter (HOSPITAL_COMMUNITY)
Admission: RE | Admit: 2015-10-25 | Discharge: 2015-10-25 | Disposition: A | Discharge status: Home / Self Care | Payer: PPO | Source: Ambulatory Visit | Attending: Endocrinology | Admitting: Endocrinology

## 2015-10-25 DIAGNOSIS — E042 Nontoxic multinodular goiter: Secondary | ICD-10-CM | POA: Insufficient documentation

## 2015-10-25 DIAGNOSIS — E0590 Thyrotoxicosis, unspecified without thyrotoxic crisis or storm: Secondary | ICD-10-CM | POA: Insufficient documentation

## 2015-10-25 MED ORDER — SODIUM IODIDE I 131 CAPSULE
11.3000 | Freq: Once | INTRAVENOUS | Status: AC | PRN
  Administered 2015-10-25: 11.3 via ORAL

## 2015-10-26 ENCOUNTER — Encounter (HOSPITAL_COMMUNITY)
Admission: RE | Admit: 2015-10-26 | Discharge: 2015-10-26 | Disposition: A | Discharge status: Home / Self Care | Payer: PPO | Source: Ambulatory Visit | Attending: Endocrinology | Admitting: Endocrinology

## 2015-10-26 DIAGNOSIS — E042 Nontoxic multinodular goiter: Secondary | ICD-10-CM | POA: Diagnosis not present

## 2015-10-26 DIAGNOSIS — E0590 Thyrotoxicosis, unspecified without thyrotoxic crisis or storm: Secondary | ICD-10-CM | POA: Diagnosis not present

## 2015-10-26 MED ORDER — SODIUM PERTECHNETATE TC 99M INJECTION: 10.7000 | Freq: Once | INTRAVENOUS | Status: DC | PRN

## 2015-10-31 ENCOUNTER — Encounter: Payer: Self-pay | Admitting: Endocrinology

## 2015-10-31 ENCOUNTER — Ambulatory Visit (INDEPENDENT_AMBULATORY_CARE_PROVIDER_SITE_OTHER): Payer: PPO | Admitting: Endocrinology

## 2015-10-31 VITALS — BP 134/84 | HR 73 | Temp 98.1°F | Ht 65.5 in | Wt 212.0 lb

## 2015-10-31 DIAGNOSIS — E042 Nontoxic multinodular goiter: Secondary | ICD-10-CM

## 2015-10-31 NOTE — Patient Instructions (Signed)
Please return in 1 year. Please let me know sooner if you decide to go ahead with the radioactive iodine treatment.

## 2015-10-31 NOTE — Progress Notes (Signed)
Subjective:    Patient ID: Miranda Hutchinson, female    DOB: 02-22-1946, 69 y.o.   MRN: [id number]  HPI Pt returns for f/u of multinodular goiter (dx'ed 2012; she has never been on therapy for this).  pt states she feels well in general.  Past Medical History  Diagnosis Date  . Dyslipidemia   . Aortic insufficiency   . Heart murmur   . Exertional dyspnea   . Hyperlipidemia   . Hyperthyroidism   . Hypothyroidism     Past Surgical History  Procedure Laterality Date  . Tonsillectomy      "I was a little child"  . Abdominal hysterectomy  1990's  . Cardiac catheterization  08/04/2012  . Coronary artery bypass graft  08/10/2012    Procedure: CORONARY ARTERY BYPASS GRAFTING (CABG);  Surgeon: Grace Isaac, MD;  Location: [place];  Service: Open Heart Surgery;  Laterality: N/A;  . Thoracic aortic aneurysm repair  08/10/2012    Procedure: THORACIC ASCENDING ANEURYSM REPAIR (AAA);  Surgeon: Grace Isaac, MD;  Location: [place];  Service: Open Heart Surgery;  Laterality: N/A;  . Aortic valve replacement  08/10/2012    Procedure: AORTIC VALVE REPLACEMENT (AVR);  Surgeon: Grace Isaac, MD;  Location: [place];  Service: Open Heart Surgery;  Laterality: N/A;    Social History   Social History  . Marital Status: Widowed    Spouse Name: N/A  . Number of Children: N/A  . Years of Education: N/A   Occupational History  . Not on file.   Social History Main Topics  . Smoking status: Former Smoker -- 0.05 packs/day for 2 years    Types: Cigarettes    Quit date: 12/30/1983  . Smokeless tobacco: Never Used  . Alcohol Use: No  . Drug Use: No  . Sexual Activity: No   Other Topics Concern  . Not on file   Social History Narrative    Current Outpatient Prescriptions on File Prior to Visit  Medication Sig Dispense Refill  . losartan (COZAAR) 25 MG tablet Take 25 mg by mouth daily.    . metoprolol tartrate (LOPRESSOR) 25 MG tablet Take 25 mg by mouth 2 (two) times daily.    .  rosuvastatin (CRESTOR) 40 MG tablet Take 1 tablet (40 mg total) by mouth daily. 90 tablet 3   No current facility-administered medications on file prior to visit.    Allergies  Allergen Reactions  . Asa [Aspirin] Nausea Only and Other (See Comments)    "hurts my stomach"    Family History  Problem Relation Age of Onset  . Lung cancer Mother   . Thyroid disease Mother     BP 134/84 mmHg  Pulse 73  Temp(Src) 98.1 F (36.7 C) (Oral)  Ht 5' 5.5" (1.664 m)  Wt 212 lb (96.163 kg)  BMI 34.73 kg/m2  SpO2 92%  Review of Systems Denies sob    Objective:   Physical Exam VITAL SIGNS:  See vs page GENERAL: no distress NECK: thyroid is 5-10 x normal size, (R>>L), with multinodular surface.    Radiol: i reviewed thyroid US and thyroid nuc med scan, and I gave copies to pt.  Lab Results  Component Value Date   TSH 0.497 07/03/2014      Assessment & Plan:  Multinodular goiter.  The largest nodule is hyperfunctioning.  We discussed options.  She declines I-131 rx for now.    Patient is advised the following: Patient Instructions  Please return in  1 year. Please let me know sooner if you decide to go ahead with the radioactive iodine treatment.

## 2015-11-07 ENCOUNTER — Other Ambulatory Visit (HOSPITAL_COMMUNITY): Payer: PPO

## 2015-11-07 ENCOUNTER — Encounter (HOSPITAL_COMMUNITY): Payer: PPO

## 2015-11-09 ENCOUNTER — Encounter (HOSPITAL_COMMUNITY): Payer: Self-pay | Admitting: *Deleted

## 2015-11-09 DIAGNOSIS — M545 Low back pain: Secondary | ICD-10-CM | POA: Diagnosis present

## 2015-11-09 DIAGNOSIS — R011 Cardiac murmur, unspecified: Secondary | ICD-10-CM | POA: Insufficient documentation

## 2015-11-09 DIAGNOSIS — E785 Hyperlipidemia, unspecified: Secondary | ICD-10-CM | POA: Diagnosis not present

## 2015-11-09 DIAGNOSIS — Z79899 Other long term (current) drug therapy: Secondary | ICD-10-CM | POA: Diagnosis not present

## 2015-11-09 DIAGNOSIS — M62830 Muscle spasm of back: Secondary | ICD-10-CM | POA: Insufficient documentation

## 2015-11-09 DIAGNOSIS — Z9889 Other specified postprocedural states: Secondary | ICD-10-CM | POA: Diagnosis not present

## 2015-11-09 DIAGNOSIS — Z87891 Personal history of nicotine dependence: Secondary | ICD-10-CM | POA: Insufficient documentation

## 2015-11-09 DIAGNOSIS — Z951 Presence of aortocoronary bypass graft: Secondary | ICD-10-CM | POA: Insufficient documentation

## 2015-11-09 DIAGNOSIS — I2510 Atherosclerotic heart disease of native coronary artery without angina pectoris: Secondary | ICD-10-CM | POA: Diagnosis not present

## 2015-11-09 NOTE — ED Notes (Signed)
The pt is having lt flank pain for 3 weeks

## 2015-11-10 ENCOUNTER — Emergency Department (HOSPITAL_COMMUNITY)
Admission: EM | Admit: 2015-11-10 | Discharge: 2015-11-10 | Disposition: A | Discharge status: Home / Self Care | Payer: PPO | Attending: Emergency Medicine | Admitting: Emergency Medicine

## 2015-11-10 DIAGNOSIS — M62830 Muscle spasm of back: Secondary | ICD-10-CM

## 2015-11-10 HISTORY — DX: Atherosclerotic heart disease of native coronary artery without angina pectoris: I25.10

## 2015-11-10 LAB — COMPREHENSIVE METABOLIC PANEL
ALK PHOS: 63 U/L (ref 38–126)
ALT: 19 U/L (ref 14–54)
ANION GAP: 12 (ref 5–15)
AST: 23 U/L (ref 15–41)
Albumin: 3.8 g/dL (ref 3.5–5.0)
BUN: 20 mg/dL (ref 6–20)
CALCIUM: 9.9 mg/dL (ref 8.9–10.3)
CHLORIDE: 107 mmol/L (ref 101–111)
CO2: 22 mmol/L (ref 22–32)
CREATININE: 1.31 mg/dL — AB (ref 0.44–1.00)
GFR, EST AFRICAN AMERICAN: 46 mL/min — AB (ref >60)
GFR, EST NON AFRICAN AMERICAN: 40 mL/min — AB (ref >60)
Glucose, Bld: 116 mg/dL — ABNORMAL HIGH (ref 65–99)
Potassium: 4.4 mmol/L (ref 3.5–5.1)
Sodium: 141 mmol/L (ref 135–145)
Total Bilirubin: 0.6 mg/dL (ref 0.3–1.2)
Total Protein: 6.8 g/dL (ref 6.5–8.1)

## 2015-11-10 LAB — URINALYSIS, ROUTINE W REFLEX MICROSCOPIC
Bilirubin Urine: NEGATIVE
Glucose, UA: NEGATIVE mg/dL
KETONES UR: NEGATIVE mg/dL
NITRITE: NEGATIVE
PROTEIN: NEGATIVE mg/dL
Specific Gravity, Urine: 1.018 (ref 1.005–1.030)
Urobilinogen, UA: 0.2 mg/dL (ref 0.0–1.0)
pH: 5 (ref 5.0–8.0)

## 2015-11-10 LAB — CBC
HCT: 38.7 % (ref 36.0–46.0)
HEMOGLOBIN: 12.9 g/dL (ref 12.0–15.0)
MCH: 29.7 pg (ref 26.0–34.0)
MCHC: 33.3 g/dL (ref 30.0–36.0)
MCV: 89 fL (ref 78.0–100.0)
PLATELETS: 227 10*3/uL (ref 150–400)
RBC: 4.35 MIL/uL (ref 3.87–5.11)
RDW: 13.1 % (ref 11.5–15.5)
WBC: 8.8 10*3/uL (ref 4.0–10.5)

## 2015-11-10 LAB — URINE MICROSCOPIC-ADD ON

## 2015-11-10 LAB — LIPASE, BLOOD: LIPASE: 27 U/L (ref 11–51)

## 2015-11-10 MED ORDER — CYCLOBENZAPRINE HCL 10 MG PO TABS
10.0000 mg | ORAL_TABLET | Freq: Once | ORAL | Status: AC
  Administered 2015-11-10: 10 mg via ORAL
  Filled 2015-11-10: qty 1

## 2015-11-10 MED ORDER — CYCLOBENZAPRINE HCL 10 MG PO TABS: 10.0000 mg | ORAL_TABLET | Freq: Three times a day (TID) | ORAL | Status: DC | PRN

## 2015-11-10 MED ORDER — HYDROCODONE-ACETAMINOPHEN 5-325 MG PO TABS: 1.0000 | ORAL_TABLET | ORAL | Status: DC | PRN

## 2015-11-10 MED ORDER — OXYCODONE-ACETAMINOPHEN 5-325 MG PO TABS
1.0000 | ORAL_TABLET | Freq: Once | ORAL | Status: AC
Start: 2015-11-10 — End: 2015-11-10
  Administered 2015-11-10: 1 via ORAL
  Filled 2015-11-10: qty 1

## 2015-11-10 MED ORDER — IBUPROFEN 400 MG PO TABS
600.0000 mg | ORAL_TABLET | Freq: Once | ORAL | Status: AC
  Administered 2015-11-10: 600 mg via ORAL
  Filled 2015-11-10 (×2): qty 1

## 2015-11-10 MED ORDER — IBUPROFEN 600 MG PO TABS: 600.0000 mg | ORAL_TABLET | Freq: Three times a day (TID) | ORAL | Status: DC | PRN

## 2015-11-10 NOTE — ED Notes (Signed)
Pt verbalized understanding of d/c instructions and has no further questions. Pt stable and NAD upon d/c. Pt to follow up with pcp.

## 2015-11-10 NOTE — ED Provider Notes (Signed)
CSN: [id number]     Arrival date & time 11/09/15  2329 History  By signing my name below, I, Miranda Hutchinson, attest that this documentation has been prepared under the direction and in the presence of Jola Schmidt, MD . Electronically Signed: Evelene Hutchinson, Scribe. 11/10/2015. 1:03 AM.     Chief Complaint  Patient presents with  . Flank Pain   The history is provided by the patient. No language interpreter was used.   HPI Comments:  Miranda Hutchinson is a 69 y.o. female who presents to the Emergency Department complaining of constant left sided lower back pain which she woke up with the pain 3 weeks ago.  She reports a h/o similar pain in the past that only lasts a few days and is resolved after using a heating pad. She has used the heating pad for this episode without relief. She has also taken an aleve without relief. She denies pain, numbness/ weakness in her BLE.   Past Medical History  Diagnosis Date  . Dyslipidemia   . Aortic insufficiency   . Heart murmur   . Exertional dyspnea   . Hyperlipidemia   . Hyperthyroidism   . Hypothyroidism   . Coronary artery disease    Past Surgical History  Procedure Laterality Date  . Tonsillectomy      "I was a little child"  . Abdominal hysterectomy  1990's  . Cardiac catheterization  08/04/2012  . Coronary artery bypass graft  08/10/2012    Procedure: CORONARY ARTERY BYPASS GRAFTING (CABG);  Surgeon: Grace Isaac, MD;  Location: [place];  Service: Open Heart Surgery;  Laterality: N/A;  . Thoracic aortic aneurysm repair  08/10/2012    Procedure: THORACIC ASCENDING ANEURYSM REPAIR (AAA);  Surgeon: Grace Isaac, MD;  Location: [place];  Service: Open Heart Surgery;  Laterality: N/A;  . Aortic valve replacement  08/10/2012    Procedure: AORTIC VALVE REPLACEMENT (AVR);  Surgeon: Grace Isaac, MD;  Location: [place];  Service: Open Heart Surgery;  Laterality: N/A;   Family History  Problem Relation Age of Onset  . Lung cancer Mother   .  Thyroid disease Mother    Social History  Substance Use Topics  . Smoking status: Former Smoker -- 0.05 packs/day for 2 years    Types: Cigarettes    Quit date: 12/30/1983  . Smokeless tobacco: Never Used  . Alcohol Use: No   OB History    No data available     Review of Systems  Cardiovascular: Negative for leg swelling.  Musculoskeletal: Positive for back pain.  Neurological: Negative for weakness and numbness.  All other systems reviewed and are negative.  Allergies  Asa  Home Medications   Prior to Admission medications   Medication Sig Start Date End Date Taking? Authorizing Provider  cycloSPORINE (RESTASIS) 0.05 % ophthalmic emulsion Place 1 drop into both eyes daily as needed (floaters).   Yes Historical Provider, MD  losartan (COZAAR) 25 MG tablet Take 25 mg by mouth daily.   Yes Historical Provider, MD  metoprolol tartrate (LOPRESSOR) 25 MG tablet Take 25 mg by mouth 2 (two) times daily.   Yes Historical Provider, MD  Multiple Vitamins-Minerals (ALIVE WOMENS 50+ PO) Take 1 tablet by mouth daily.   Yes Historical Provider, MD  rosuvastatin (CRESTOR) 40 MG tablet Take 1 tablet (40 mg total) by mouth daily. 10/19/15  Yes Sueanne Margarita, MD   BP 179/81 mmHg  Pulse 80  Temp(Src) 98.7 F (37.1 C) (Oral)  Resp 18  Ht 5\' 5"  (1.651 m)  Wt 214 lb (97.07 kg)  BMI 35.61 kg/m2  SpO2 96% Physical Exam  Constitutional: She is oriented to person, place, and time. She appears well-developed and well-nourished. No distress.  HENT:  Head: Normocephalic and atraumatic.  Eyes: EOM are normal.  Neck: Normal range of motion.  Cardiovascular: Normal rate, regular rhythm and normal heart sounds.   Pulmonary/Chest: Effort normal and breath sounds normal.  Abdominal: Soft. She exhibits no distension. There is no tenderness.  Musculoskeletal: Normal range of motion.  No lumbar or thoracic spinal tenderness There is paralumbar tenderness on the left with mild spasms   Neurological:  She is alert and oriented to person, place, and time.  Skin: Skin is warm and dry.  Psychiatric: She has a normal mood and affect. Judgment normal.  Nursing note and vitals reviewed.   ED Course  Procedures   DIAGNOSTIC STUDIES:  Oxygen Saturation is 96% on RA, normal by my interpretation.    COORDINATION OF CARE:  12:56 AM Will discharge with pain meds. Advised pt to continue applying heat. Discussed treatment plan with pt at bedside and pt agreed to plan.  Labs Review Labs Reviewed  COMPREHENSIVE METABOLIC PANEL - Abnormal; Notable for the following:    Glucose, Bld 116 (*)    Creatinine, Ser 1.31 (*)    GFR calc non Af Amer 40 (*)    GFR calc Af Amer 46 (*)    All other components within normal limits  URINALYSIS, ROUTINE W REFLEX MICROSCOPIC (NOT AT [facility]) - Abnormal; Notable for the following:    APPearance TURBID (*)    Hgb urine dipstick SMALL (*)    Leukocytes, UA MODERATE (*)    All other components within normal limits  URINE MICROSCOPIC-ADD ON - Abnormal; Notable for the following:    Squamous Epithelial / LPF FEW (*)    Bacteria, UA MANY (*)    All other components within normal limits  LIPASE, BLOOD  CBC    Imaging Review No results found. I have personally reviewed and evaluated these lab results as part of my medical decision-making.   EKG Interpretation None      MDM   Final diagnoses:  Spasm of lumbar paraspinous muscle   Patient's pain is reproducible on palpation of her paraspinal muscles on the left.  There is notable spasm around her paralumbar muscles.  No rash..  Her pain seems to be musculoskeletal.  Home with heat and anti-inflammatories.  I personally performed the services described in this documentation, which was scribed in my presence. The recorded information has been reviewed and is accurate.       Jola Schmidt, MD 11/10/15 [phone]

## 2015-11-10 NOTE — ED Notes (Signed)
Pt given heating packs to place on area of the back that is sore.

## 2015-11-14 ENCOUNTER — Telehealth (HOSPITAL_COMMUNITY): Payer: Self-pay | Admitting: *Deleted

## 2015-11-14 NOTE — Telephone Encounter (Signed)
Left message on voicemail in reference to upcoming appointment scheduled for 11/20/15. Phone number given for a call back so details instructions can be given. Hubbard Robinson, RN

## 2015-11-15 ENCOUNTER — Telehealth (HOSPITAL_COMMUNITY): Payer: Self-pay

## 2015-11-15 NOTE — Telephone Encounter (Signed)
Left message on voicemail in reference to upcoming appointment scheduled for 11-20-2015. Phone number given for a call back so details instructions can be given. Oletta Lamas, Evalisse Prajapati A

## 2015-11-19 ENCOUNTER — Telehealth (HOSPITAL_COMMUNITY): Payer: Self-pay | Admitting: *Deleted

## 2015-11-19 NOTE — Telephone Encounter (Signed)
Left message on voicemail in reference to upcoming appointment scheduled for 11/20/15. Phone number given for a call back so details instructions can be given. Miranda Hutchinson, Miranda Hutchinson

## 2015-11-20 ENCOUNTER — Other Ambulatory Visit (HOSPITAL_COMMUNITY): Payer: PPO

## 2015-11-20 ENCOUNTER — Encounter (HOSPITAL_COMMUNITY): Payer: PPO

## 2015-11-20 ENCOUNTER — Ambulatory Visit (HOSPITAL_COMMUNITY): Payer: PPO | Attending: Cardiology

## 2015-11-20 DIAGNOSIS — R0989 Other specified symptoms and signs involving the circulatory and respiratory systems: Secondary | ICD-10-CM

## 2015-12-05 ENCOUNTER — Other Ambulatory Visit (INDEPENDENT_AMBULATORY_CARE_PROVIDER_SITE_OTHER): Payer: PPO

## 2015-12-05 DIAGNOSIS — E7800 Pure hypercholesterolemia, unspecified: Secondary | ICD-10-CM

## 2015-12-05 NOTE — Addendum Note (Signed)
Addended by: Eulis Foster on: 12/05/2015 07:47 AM   Modules accepted: Orders

## 2016-02-08 DIAGNOSIS — I10 Essential (primary) hypertension: Secondary | ICD-10-CM | POA: Diagnosis not present

## 2016-02-08 DIAGNOSIS — R42 Dizziness and giddiness: Secondary | ICD-10-CM | POA: Diagnosis not present

## 2016-02-08 DIAGNOSIS — E785 Hyperlipidemia, unspecified: Secondary | ICD-10-CM | POA: Diagnosis not present

## 2016-02-08 DIAGNOSIS — R252 Cramp and spasm: Secondary | ICD-10-CM | POA: Diagnosis not present

## 2016-02-08 DIAGNOSIS — K219 Gastro-esophageal reflux disease without esophagitis: Secondary | ICD-10-CM | POA: Diagnosis not present

## 2016-02-08 DIAGNOSIS — R51 Headache: Secondary | ICD-10-CM | POA: Diagnosis not present

## 2016-02-08 DIAGNOSIS — M5432 Sciatica, left side: Secondary | ICD-10-CM | POA: Diagnosis not present

## 2016-02-08 DIAGNOSIS — E0590 Thyrotoxicosis, unspecified without thyrotoxic crisis or storm: Secondary | ICD-10-CM | POA: Diagnosis not present

## 2016-02-08 DIAGNOSIS — H538 Other visual disturbances: Secondary | ICD-10-CM | POA: Diagnosis not present

## 2016-02-08 DIAGNOSIS — I2510 Atherosclerotic heart disease of native coronary artery without angina pectoris: Secondary | ICD-10-CM | POA: Diagnosis not present

## 2016-06-02 DIAGNOSIS — R8290 Unspecified abnormal findings in urine: Secondary | ICD-10-CM | POA: Diagnosis not present

## 2016-06-02 DIAGNOSIS — R1032 Left lower quadrant pain: Secondary | ICD-10-CM | POA: Diagnosis not present

## 2016-08-08 DIAGNOSIS — Z952 Presence of prosthetic heart valve: Secondary | ICD-10-CM | POA: Diagnosis not present

## 2016-08-08 DIAGNOSIS — Z23 Encounter for immunization: Secondary | ICD-10-CM | POA: Diagnosis not present

## 2016-08-08 DIAGNOSIS — K219 Gastro-esophageal reflux disease without esophagitis: Secondary | ICD-10-CM | POA: Diagnosis not present

## 2016-08-08 DIAGNOSIS — E785 Hyperlipidemia, unspecified: Secondary | ICD-10-CM | POA: Diagnosis not present

## 2016-08-08 DIAGNOSIS — E0590 Thyrotoxicosis, unspecified without thyrotoxic crisis or storm: Secondary | ICD-10-CM | POA: Diagnosis not present

## 2016-08-08 DIAGNOSIS — I10 Essential (primary) hypertension: Secondary | ICD-10-CM | POA: Diagnosis not present

## 2016-08-08 DIAGNOSIS — I2510 Atherosclerotic heart disease of native coronary artery without angina pectoris: Secondary | ICD-10-CM | POA: Diagnosis not present

## 2016-08-08 DIAGNOSIS — Z8719 Personal history of other diseases of the digestive system: Secondary | ICD-10-CM | POA: Diagnosis not present

## 2016-08-08 DIAGNOSIS — M5432 Sciatica, left side: Secondary | ICD-10-CM | POA: Diagnosis not present

## 2016-08-28 DIAGNOSIS — Z6834 Body mass index (BMI) 34.0-34.9, adult: Secondary | ICD-10-CM | POA: Diagnosis not present

## 2016-08-28 DIAGNOSIS — Z01419 Encounter for gynecological examination (general) (routine) without abnormal findings: Secondary | ICD-10-CM | POA: Diagnosis not present

## 2016-08-28 DIAGNOSIS — N3281 Overactive bladder: Secondary | ICD-10-CM | POA: Diagnosis not present

## 2016-08-29 ENCOUNTER — Other Ambulatory Visit: Payer: Self-pay | Admitting: Obstetrics and Gynecology

## 2016-08-29 DIAGNOSIS — N63 Unspecified lump in unspecified breast: Secondary | ICD-10-CM

## 2016-10-17 ENCOUNTER — Ambulatory Visit
Admission: RE | Admit: 2016-10-17 | Discharge: 2016-10-17 | Disposition: A | Discharge status: Home / Self Care | Payer: PPO | Source: Ambulatory Visit | Attending: Obstetrics and Gynecology | Admitting: Obstetrics and Gynecology

## 2016-10-17 ENCOUNTER — Other Ambulatory Visit: Payer: Self-pay | Admitting: Obstetrics and Gynecology

## 2016-10-17 DIAGNOSIS — N63 Unspecified lump in unspecified breast: Secondary | ICD-10-CM

## 2016-10-17 DIAGNOSIS — N6321 Unspecified lump in the left breast, upper outer quadrant: Secondary | ICD-10-CM | POA: Diagnosis not present

## 2016-10-17 DIAGNOSIS — N6310 Unspecified lump in the right breast, unspecified quadrant: Secondary | ICD-10-CM | POA: Diagnosis not present

## 2016-10-30 ENCOUNTER — Ambulatory Visit: Payer: PPO | Admitting: Endocrinology

## 2016-11-03 ENCOUNTER — Encounter: Payer: Self-pay | Admitting: Endocrinology

## 2016-11-03 ENCOUNTER — Ambulatory Visit (INDEPENDENT_AMBULATORY_CARE_PROVIDER_SITE_OTHER): Payer: PPO | Admitting: Endocrinology

## 2016-11-03 VITALS — BP 146/74 | HR 83 | Wt 213.0 lb

## 2016-11-03 DIAGNOSIS — E0590 Thyrotoxicosis, unspecified without thyrotoxic crisis or storm: Secondary | ICD-10-CM

## 2016-11-03 NOTE — Progress Notes (Signed)
Subjective:    Patient ID: Miranda Hutchinson, female    DOB: [birth year], 70 y.o.   MRN: [id number]  HPI Pt returns for f/u of multinodular goiter (dx'ed 2012; she has never been on therapy for this; TSH has been intermittently slightly low; in 2016, [place] and nuc med scan showed the largest nodule is hyperfunctioning).  pt states she feels well in general. She says the goiter is unchanged.   Past Medical History:  Diagnosis Date  . Aortic insufficiency   . Coronary artery disease   . Dyslipidemia   . Exertional dyspnea   . Heart murmur   . Hyperlipidemia   . Hyperthyroidism   . Hypothyroidism     Past Surgical History:  Procedure Laterality Date  . ABDOMINAL HYSTERECTOMY  1990's  . AORTIC VALVE REPLACEMENT  08/10/2012   Procedure: AORTIC VALVE REPLACEMENT (AVR);  Surgeon: Grace Isaac, MD;  Location: [place] [facility];  Service: Open Heart Surgery;  Laterality: N/A;  . CARDIAC CATHETERIZATION  08/04/2012  . CORONARY ARTERY BYPASS GRAFT  08/10/2012   Procedure: CORONARY ARTERY BYPASS GRAFTING (CABG);  Surgeon: Grace Isaac, MD;  Location: [place];  Service: Open Heart Surgery;  Laterality: N/A;  . THORACIC AORTIC ANEURYSM REPAIR  08/10/2012   Procedure: THORACIC ASCENDING ANEURYSM REPAIR (AAA);  Surgeon: Grace Isaac, MD;  Location: [place];  Service: Open Heart Surgery;  Laterality: N/A;  . TONSILLECTOMY     "I was a little child"    Social History   Social History  . Marital status: Widowed    Spouse name: N/A  . Number of children: N/A  . Years of education: N/A   Occupational History  . Not on file.   Social History Main Topics  . Smoking status: Former Smoker    Packs/day: 0.05    Years: 2.00    Types: Cigarettes    Quit date: 12/30/1983  . Smokeless tobacco: Never Used  . Alcohol use No  . Drug use: No  . Sexual activity: No   Other Topics Concern  . Not on file   Social History Narrative  . No narrative on file    Current Outpatient Prescriptions on File Prior  to Visit  Medication Sig Dispense Refill  . cyclobenzaprine (FLEXERIL) 10 MG tablet Take 1 tablet (10 mg total) by mouth 3 (three) times daily as needed for muscle spasms. 12 tablet 0  . cycloSPORINE (RESTASIS) 0.05 % ophthalmic emulsion Place 1 drop into both eyes daily as needed (floaters).    . losartan (COZAAR) 25 MG tablet Take 25 mg by mouth daily.    . metoprolol tartrate (LOPRESSOR) 25 MG tablet Take 25 mg by mouth 2 (two) times daily.    . Multiple Vitamins-Minerals (ALIVE WOMENS 50+ PO) Take 1 tablet by mouth daily.    . rosuvastatin (CRESTOR) 40 MG tablet Take 1 tablet (40 mg total) by mouth daily. 90 tablet 3   No current facility-administered medications on file prior to visit.     Allergies  Allergen Reactions  . Asa [Aspirin] Nausea Only and Other (See Comments)    "hurts my stomach"    Family History  Problem Relation Age of Onset  . Lung cancer Mother   . Thyroid disease Mother    BP (!) 146/74   Pulse 83   Wt 213 lb (96.6 kg)   SpO2 97%   BMI 35.45 kg/m   Review of Systems Denies dysphagia and sob    Objective:  Physical Exam VITAL SIGNS:  See vs page.  GENERAL: no distress.  NECK: thyroid is 5-10 x normal size, (R>>L), with multinodular surface.    outside test results are reviewed:  TSH=0.38    Assessment & Plan:  Multinodular goiter, clinically unchanged Hyperthyroidism, mild, due to the goiter> I advised RAI, but says she wants to think it over.   Patient is advised the following: Patient Instructions  Please return in 1 year. Please let me know sooner if you decide to go ahead with the radioactive iodine treatment.   most of the time, a "lumpy thyroid" will eventually become more overactive.  this is usually a slow process, happening over the span of many years.

## 2016-11-03 NOTE — Patient Instructions (Addendum)
Please return in 1 year. Please let me know sooner if you decide to go ahead with the radioactive iodine treatment.   most of the time, a "lumpy thyroid" will eventually become more overactive.  this is usually a slow process, happening over the span of many years.

## 2016-12-09 DIAGNOSIS — K5792 Diverticulitis of intestine, part unspecified, without perforation or abscess without bleeding: Secondary | ICD-10-CM | POA: Diagnosis not present

## 2016-12-09 DIAGNOSIS — R110 Nausea: Secondary | ICD-10-CM | POA: Diagnosis not present

## 2017-03-06 DIAGNOSIS — M5432 Sciatica, left side: Secondary | ICD-10-CM | POA: Diagnosis not present

## 2017-03-06 DIAGNOSIS — E7800 Pure hypercholesterolemia, unspecified: Secondary | ICD-10-CM | POA: Diagnosis not present

## 2017-03-06 DIAGNOSIS — I10 Essential (primary) hypertension: Secondary | ICD-10-CM | POA: Diagnosis not present

## 2017-03-06 DIAGNOSIS — Z1159 Encounter for screening for other viral diseases: Secondary | ICD-10-CM | POA: Diagnosis not present

## 2017-03-06 DIAGNOSIS — E0590 Thyrotoxicosis, unspecified without thyrotoxic crisis or storm: Secondary | ICD-10-CM | POA: Diagnosis not present

## 2017-03-06 DIAGNOSIS — Z952 Presence of prosthetic heart valve: Secondary | ICD-10-CM | POA: Diagnosis not present

## 2017-03-06 DIAGNOSIS — R3915 Urgency of urination: Secondary | ICD-10-CM | POA: Diagnosis not present

## 2017-03-06 DIAGNOSIS — K219 Gastro-esophageal reflux disease without esophagitis: Secondary | ICD-10-CM | POA: Diagnosis not present

## 2017-03-06 DIAGNOSIS — Z0000 Encounter for general adult medical examination without abnormal findings: Secondary | ICD-10-CM | POA: Diagnosis not present

## 2017-03-06 DIAGNOSIS — Z23 Encounter for immunization: Secondary | ICD-10-CM | POA: Diagnosis not present

## 2017-03-06 DIAGNOSIS — M25511 Pain in right shoulder: Secondary | ICD-10-CM | POA: Diagnosis not present

## 2017-03-06 DIAGNOSIS — I2510 Atherosclerotic heart disease of native coronary artery without angina pectoris: Secondary | ICD-10-CM | POA: Diagnosis not present

## 2017-09-03 DIAGNOSIS — Z6834 Body mass index (BMI) 34.0-34.9, adult: Secondary | ICD-10-CM | POA: Diagnosis not present

## 2017-09-03 DIAGNOSIS — Z01419 Encounter for gynecological examination (general) (routine) without abnormal findings: Secondary | ICD-10-CM | POA: Diagnosis not present

## 2017-09-04 ENCOUNTER — Other Ambulatory Visit: Payer: Self-pay | Admitting: Obstetrics and Gynecology

## 2017-09-04 DIAGNOSIS — Z1231 Encounter for screening mammogram for malignant neoplasm of breast: Secondary | ICD-10-CM

## 2017-09-07 ENCOUNTER — Other Ambulatory Visit: Payer: Self-pay | Admitting: Obstetrics and Gynecology

## 2017-09-07 DIAGNOSIS — E042 Nontoxic multinodular goiter: Secondary | ICD-10-CM

## 2017-09-10 ENCOUNTER — Telehealth: Payer: Self-pay

## 2017-09-10 ENCOUNTER — Other Ambulatory Visit: Payer: PPO

## 2017-09-10 DIAGNOSIS — I10 Essential (primary) hypertension: Secondary | ICD-10-CM | POA: Diagnosis not present

## 2017-09-10 DIAGNOSIS — K219 Gastro-esophageal reflux disease without esophagitis: Secondary | ICD-10-CM | POA: Diagnosis not present

## 2017-09-10 DIAGNOSIS — I2510 Atherosclerotic heart disease of native coronary artery without angina pectoris: Secondary | ICD-10-CM | POA: Diagnosis not present

## 2017-09-10 DIAGNOSIS — E0590 Thyrotoxicosis, unspecified without thyrotoxic crisis or storm: Secondary | ICD-10-CM | POA: Diagnosis not present

## 2017-09-10 DIAGNOSIS — Z6835 Body mass index (BMI) 35.0-35.9, adult: Secondary | ICD-10-CM | POA: Diagnosis not present

## 2017-09-10 DIAGNOSIS — Z23 Encounter for immunization: Secondary | ICD-10-CM | POA: Diagnosis not present

## 2017-09-10 DIAGNOSIS — M5432 Sciatica, left side: Secondary | ICD-10-CM | POA: Diagnosis not present

## 2017-09-10 DIAGNOSIS — E7800 Pure hypercholesterolemia, unspecified: Secondary | ICD-10-CM | POA: Diagnosis not present

## 2017-09-10 DIAGNOSIS — Z952 Presence of prosthetic heart valve: Secondary | ICD-10-CM | POA: Diagnosis not present

## 2017-09-10 NOTE — Telephone Encounter (Signed)
SENT NOTES TO SCHEDULING 

## 2017-09-16 ENCOUNTER — Ambulatory Visit
Admission: RE | Admit: 2017-09-16 | Discharge: 2017-09-16 | Disposition: A | Discharge status: Home / Self Care | Payer: PPO | Source: Ambulatory Visit | Attending: Obstetrics and Gynecology | Admitting: Obstetrics and Gynecology

## 2017-09-16 DIAGNOSIS — E042 Nontoxic multinodular goiter: Secondary | ICD-10-CM | POA: Diagnosis not present

## 2017-09-17 ENCOUNTER — Telehealth: Payer: Self-pay | Admitting: *Deleted

## 2017-09-17 ENCOUNTER — Ambulatory Visit (INDEPENDENT_AMBULATORY_CARE_PROVIDER_SITE_OTHER): Payer: PPO | Admitting: Physician Assistant

## 2017-09-17 ENCOUNTER — Encounter: Payer: Self-pay | Admitting: *Deleted

## 2017-09-17 ENCOUNTER — Other Ambulatory Visit: Payer: Self-pay | Admitting: Physician Assistant

## 2017-09-17 ENCOUNTER — Encounter: Payer: Self-pay | Admitting: Physician Assistant

## 2017-09-17 ENCOUNTER — Encounter (INDEPENDENT_AMBULATORY_CARE_PROVIDER_SITE_OTHER): Payer: Self-pay

## 2017-09-17 VITALS — BP 142/74 | HR 72 | Ht 66.0 in | Wt 217.0 lb

## 2017-09-17 DIAGNOSIS — Z8679 Personal history of other diseases of the circulatory system: Secondary | ICD-10-CM

## 2017-09-17 DIAGNOSIS — Z953 Presence of xenogenic heart valve: Secondary | ICD-10-CM

## 2017-09-17 DIAGNOSIS — I739 Peripheral vascular disease, unspecified: Secondary | ICD-10-CM

## 2017-09-17 DIAGNOSIS — I779 Disorder of arteries and arterioles, unspecified: Secondary | ICD-10-CM

## 2017-09-17 DIAGNOSIS — L989 Disorder of the skin and subcutaneous tissue, unspecified: Secondary | ICD-10-CM

## 2017-09-17 DIAGNOSIS — Z9889 Other specified postprocedural states: Secondary | ICD-10-CM

## 2017-09-17 DIAGNOSIS — I2510 Atherosclerotic heart disease of native coronary artery without angina pectoris: Secondary | ICD-10-CM

## 2017-09-17 DIAGNOSIS — R21 Rash and other nonspecific skin eruption: Secondary | ICD-10-CM

## 2017-09-17 DIAGNOSIS — E785 Hyperlipidemia, unspecified: Secondary | ICD-10-CM | POA: Diagnosis not present

## 2017-09-17 MED ORDER — LOSARTAN POTASSIUM 50 MG PO TABS: 50.0000 mg | ORAL_TABLET | Freq: Every day | ORAL | 1 refills | Status: DC

## 2017-09-17 MED ORDER — ROSUVASTATIN CALCIUM 40 MG PO TABS: 40.0000 mg | ORAL_TABLET | Freq: Every day | ORAL | 1 refills | Status: DC

## 2017-09-17 NOTE — Telephone Encounter (Signed)
I have tried to reach pt because she didn't make it to the lab today at her o/v. Pt does need to come back and get Sed Rate and CBC.

## 2017-09-17 NOTE — Patient Instructions (Addendum)
Medication Instructions:  Your physician has recommended you make the following change in your medication:  1.  INCREASE the Losartan to 50 mg taking 1 tablet daily 2.  INCREASE the Crestor to 40 mg daily   Labwork: TODAY:  CBC & SED RATE  6 WEEKS:  CMET AND FASTING LIPID  Testing/Procedures: Your physician has requested that you have an echocardiogram. Echocardiography is a painless test that uses sound waves to create images of your heart. It provides your doctor with information about the size and shape of your heart and how well your heart's chambers and valves are working. This procedure takes approximately one hour. There are no restrictions for this procedure.  Your physician has requested that you have a carotid duplex. This test is an ultrasound of the carotid arteries in your neck. It looks at blood flow through these arteries that supply the brain with blood. Allow one hour for this exam. There are no restrictions or special instructions.      Follow-Up: Your physician recommends that you schedule a follow-up appointment in: [place] FOR BLOOD PRESSURE CHECK   You have been referred to DERMATOLOGY.  Your physician wants you to follow-up in: [place] will receive a reminder letter in the mail two months in advance. If you don't receive a letter, please call our office to schedule the follow-up appointment.    Any Other Special Instructions Will Be Listed Below (If Applicable).  Please monitor your blood pressure occasionally at home. Call your doctor if you tend to get readings of greater than 130 on the top number or 80 on the bottom number.  Endocarditis Information  You may be at risk for developing endocarditis since you have  an artificial heart valve  or a repaired heart valve. Endocarditis is an infection of the lining of the heart or heart valves.   Certain surgical and dental procedures may put you at risk,  such as teeth  cleaning or other dental procedures or any surgery involving the respiratory, urinary, gastrointestinal tract, gallbladder or prostate.   Notify your doctor or dentist before having any invasive procedures. You will need to take antibiotics before certain procedures.   To prevent endocarditis, maintain good oral health. Seek prompt medical attention for any mouth/gum, skin or urinary tract infections.     Echocardiogram An echocardiogram, or echocardiography, uses sound waves (ultrasound) to produce an image of your heart. The echocardiogram is simple, painless, obtained within a short period of time, and offers valuable information to your health care provider. The images from an echocardiogram can provide information such as:  Evidence of coronary artery disease (CAD).  Heart size.  Heart muscle function.  Heart valve function.  Aneurysm detection.  Evidence of a past heart attack.  Fluid buildup around the heart.  Heart muscle thickening.  Assess heart valve function.  Tell a health care provider about:  Any allergies you have.  All medicines you are taking, including vitamins, herbs, eye drops, creams, and over-the-counter medicines.  Any problems you or family members have had with anesthetic medicines.  Any blood disorders you have.  Any surgeries you have had.  Any medical conditions you have.  Whether you are pregnant or may be pregnant. What happens before the procedure? No special preparation is needed. Eat and drink normally. What happens during the procedure?  In order to produce an image of your heart, gel will be applied to your chest  and a wand-like tool (transducer) will be moved over your chest. The gel will help transmit the sound waves from the transducer. The sound waves will harmlessly bounce off your heart to allow the heart images to be captured in real-time motion. These images will then be recorded.  You may need an IV to receive a  medicine that improves the quality of the pictures. What happens after the procedure? You may return to your normal schedule including diet, activities, and medicines, unless your health care provider tells you otherwise. This information is not intended to replace advice given to you by your health care provider. Make sure you discuss any questions you have with your health care provider. Document Released: 12/12/2000 Document Revised: 08/02/2016 Document Reviewed: 08/22/2013 Elsevier Interactive Patient Education  2017 Else[employer] Inc[employer]    Carotid Artery Disease The carotid arteries are the two main arteries on either side of the neck that supply blood to the brain. Carotid artery disease, also called carotid artery stenosis, is the narrowing or blockage of one or both carotid arteries. Carotid artery disease increases your risk for a stroke or a transient ischemic attack (TIA). A TIA is an episode in which a waxy, fatty substance that accumulates within the artery (plaque) blocks blood flow to the brain. A TIA is considered a "warning stroke." What are the causes?  Buildup of plaque inside the carotid arteries (atherosclerosis) (common).  A weakened outpouching in an artery (aneurysm).  Inflammation of the carotid artery (arteritis).  A fibrous growth within the carotid artery (fibromuscular dysplasia).  Tissue death within the carotid artery due to radiation treatment (post-radiation necrosis).  Decreased blood flow due to spasms of the carotid artery (vasospasm).  Separation of the walls of the carotid artery (carotid dissection). What increases the risk?  High cholesterol (dyslipidemia).  High blood pressure (hypertension).  Smoking.  Obesity.  Diabetes.  Family history of cardiovascular disease.  Inactivity or lack of regular exercise.  Being female. Men have an increased risk of developing atherosclerosis earlier in life than women. What are the signs or  symptoms? Carotid artery disease does not cause symptoms. How is this diagnosed? Diagnosis of carotid artery disease may include:  A physical exam. Your health care provider may hear an abnormal sound (bruit) when listening to the carotid arteries.  Specific tests that look at the blood flow in the carotid arteries. These tests include: ? Carotid artery ultrasonography. ? Carotid or cerebral angiography. ? Computerized tomographic angiography (CTA). ? Magnetic resonance angiography (MRA).  How is this treated? Treatment of carotid artery disease can include a combination of treatments. Treatment options include:  Surgery. You may have: ? A carotid endarterectomy. This is a surgery to remove the blockages in the carotid arteries. ? A carotid angioplasty with stenting. This is a nonsurgical interventional procedure. A wire mesh (stent) is used to widen the blocked carotid arteries.  Medicines to control blood pressure, cholesterol, and reduce blood clotting (antiplatelet therapy).  Adjusting your diet.  Lifestyle changes such as: ? Quitting smoking. ? Exercising as tolerated or as directed by your health care provider. ? Controlling and maintaining a good blood pressure. ? Keeping cholesterol levels under control.  Follow these instructions at home:  Take medicines only as directed by your health care provider. Make sure you understand all your medicine instructions. Do not stop your medicines without talking to your health care provider.  Follow your health care provider's diet instructions. It is important to eat a healthy diet that is  low in saturated fats and includes plenty of fresh fruits, vegetables, and lean meats. High-fat, high-sodium foods as well as foods that are fried, overly processed, or have poor nutritional value should be avoided.  Maintain a healthy weight.  Stay physically active. It is recommended that you get at least 30 minutes of activity every day.  Do  not use any tobacco products including cigarettes, chewing tobacco, or electronic cigarettes. If you need help quitting, ask your health care provider.  Limit alcohol use to: ? No more than 2 drinks per day for men. ? No more than 1 drink per day for nonpregnant women.  Do not use illegal drugs.  Keep all follow-up visits as directed by your health care provider. Get help right away if: You develop TIA or stroke symptoms. These include:  Sudden weakness or numbness on one side of the body, such as in the face, arm, or leg.  Sudden confusion.  Trouble speaking (aphasia) or understanding.  Sudden trouble seeing out of one or both eyes.  Sudden trouble walking.  Dizziness or feeling like you might faint.  Loss of balance or coordination.  Sudden severe headache with no known cause.  Sudden trouble swallowing (dysphagia).  If you have any of these symptoms, call your local emergency services (911 in [place] Do not drive yourself to the clinic or hospital. This is a medical emergency. This information is not intended to replace advice given to you by your health care provider. Make sure you discuss any questions you have with your health care provider. Document Released: 03/08/2012 Document Revised: 05/22/2016 Document Reviewed: 06/15/2013 Elsevier Interactive Patient Education  2017 [employer].   If you need a refill on your cardiac medications before your next appointment, please call your pharmacy.

## 2017-09-17 NOTE — Progress Notes (Signed)
Cardiology Office Note    Date:  09/17/2017  ID:  Miranda Hutchinson, DOB 18-Apr-1946, MRN [id number] PCP:  Antony Contras, MD  Cardiologist:  Dr. Radford Pax   Chief Complaint: f/u CAD, aortic valve surgery  History of Present Illness:  Miranda Hutchinson is a 71 y.o. female with history of  3 vessel CAD s/p CABG (LIMA to LAD, SVG to OM, SVG to PDA) with ascending aortic root aneursym with AI s/p Bentall procedure with bioprosthetic aortic valve 07/2012, dyslipidemia, carotid artery disease, thyroid disease, intolerance of aspirin who presents for overdue annual f/u. Last seen by Dr. Radford Pax 09/2015. At that visit she was reporting some exertional fatigue so nuclear stress test and echo were ordered but without patient follow-through. Last carotid duplex 2013 showed 40-59% distal RICA and 11-02% mLICA stenosis, with repeat ordered in 2015 but not completed. Last labs by PCP showed normal CBC in 02/2017, then CMET/lipids in 08/2017 showing LDL 129 (compliant with Crestor), Na 142, K 4.5, CO2 29, normal LFTs, BUN 16, Cr 0.91. She previously was lost to follow-up due to financial constraints.  She returns for follow-up feeling well. The symptoms of dyspnea and fatigue that she previously had have resolved. She's been able to complete regular physical activity/walking without any angina. No bleeding issues. No fevers, chills, malaise. She does state for about a month she's noticed a few dark spots on the bottom of her feet. She denies any claudication or any ulcers on her feet or legs. She reports compliance with meds.  Past Medical History:  Diagnosis Date  . Aortic insufficiency    a. ascending aortic root aneursym with AI s/p Bentall procedure with bioprosthetic aortic valve 07/2012.  . Ascending aortic aneurysm ([place])    a. s/p Bentall 2013.  . Aspirin intolerance   . Bilateral carotid artery disease ([place])    a.  Last carotid duplex 2013 showed 40-59% distal RICA and 11-17% mLICA stenosis.  . Coronary  artery disease    a. 3 vessel ASCAD s/p CABG (LIMA to LAD, SVG to OM, SVG to PDA) at time of Bentall 07/2012.  Marland Kitchen Dyslipidemia   . Heart murmur   . Hyperlipidemia   . Hyperthyroidism   . Hypothyroidism   . RBBB   . Renal insufficiency    a. Cr 1.31 in 2016, previously 1 range.    Past Surgical History:  Procedure Laterality Date  . ABDOMINAL HYSTERECTOMY  1990's  . AORTIC VALVE REPLACEMENT  08/10/2012   Procedure: AORTIC VALVE REPLACEMENT (AVR);  Surgeon: Grace Isaac, MD;  Location: [facility];  Service: Open Heart Surgery;  Laterality: N/A;  . CARDIAC CATHETERIZATION  08/04/2012  . CORONARY ARTERY BYPASS GRAFT  08/10/2012   Procedure: CORONARY ARTERY BYPASS GRAFTING (CABG);  Surgeon: Grace Isaac, MD;  Location: [facility];  Service: Open Heart Surgery;  Laterality: N/A;  . THORACIC AORTIC ANEURYSM REPAIR  08/10/2012   Procedure: THORACIC ASCENDING ANEURYSM REPAIR (AAA);  Surgeon: Grace Isaac, MD;  Location: [facility];  Service: Open Heart Surgery;  Laterality: N/A;  . TONSILLECTOMY     "I was a little child"    Current Medications: Current Meds  Medication Sig  . cycloSPORINE (RESTASIS) 0.05 % ophthalmic emulsion Place 1 drop into both eyes daily as needed (floaters).  . losartan (COZAAR) 25 MG tablet Take 25 mg by mouth daily.  . metoprolol tartrate (LOPRESSOR) 25 MG tablet Take 25 mg by mouth 2 (two) times daily.  . Multiple Vitamins-Minerals (ALIVE WOMENS  50+ PO) Take 1 tablet by mouth daily.  . rosuvastatin (CRESTOR) 20 MG tablet Take 20 mg by mouth daily.     Allergies:   Asa [aspirin]   Social History   Social History  . Marital status: Widowed    Spouse name: N/A  . Number of children: N/A  . Years of education: N/A   Social History Main Topics  . Smoking status: Former Smoker    Packs/day: 0.05    Years: 2.00    Types: Cigarettes    Quit date: 12/30/1983  . Smokeless tobacco: Never Used  . Alcohol use No  . Drug use: No  . Sexual activity: No   Other  Topics Concern  . None   Social History Narrative  . None     Family History:  Family History  Problem Relation Age of Onset  . Lung cancer Mother   . Thyroid disease Mother     ROS:   Please see the history of present illness.  All other systems are reviewed and otherwise negative.    PHYSICAL EXAM:   VS:  BP (!) 142/74 (BP Location: Left Arm)   Pulse 72   Ht _0  (1.676 m)   Wt 217 lb (98.4 kg)   BMI 35.02 kg/m   BMI: Body mass index is 35.02 kg/m. GEN: Well nourished, well developed AAF, in no acute distress  HEENT: normocephalic, atraumatic Neck: no JVD, carotid bruits, or masses Cardiac:RRR; very soft SEM LUSB, no rubs or gallops, no edema  Respiratory:  clear to auscultation bilaterally, normal work of breathing GI: soft, nontender, nondistended, + BS MS: no deformity or atrophy  Skin: warm and dry, small dark spots (nearly black) on bottom of feet, 3 on one foot 1 on the other, flat, without any ulceration or tenderness, no apparent injury, no tenderness Neuro:  Alert and Oriented x 3, Strength and sensation are intact, follows commands Psych: euthymic mood, full affect  Wt Readings from Last 3 Encounters:  09/17/17 217 lb (98.4 kg)  11/03/16 213 lb (96.6 kg)  11/09/15 214 lb (97.1 kg)      Studies/Labs Reviewed:   EKG:  EKG was ordered today and personally reviewed by me and demonstrates NSR 72bpm RBBB nonspecific ST-T Changes similar to prior.  Recent Labs: No results found for requested labs within last 8760 hours.   Lipid Panel    Component Value Date/Time   CHOL 180 10/16/2015 0958   TRIG 90 10/16/2015 0958   HDL 39 (L) 10/16/2015 0958   CHOLHDL 4.6 10/16/2015 0958   VLDL 18 10/16/2015 0958   LDLCALC 123 10/16/2015 0958    Additional studies/ records that were reviewed today include: Summarized above.    ASSESSMENT & PLAN:   1. CAD - stable, no recent symptoms. She has been historically intolerant of aspirin. Continue beta blocker.  See below re: statin. 2. Aortic insufficiency and ascending aortic aneurysm s/p bioprosthetic AVR and Bentall procedure - due for updated echo. Will repeat. Mild systolic murmur noted on exam which patient states is chronic. 3. Foot lesions - unusual appearance, not classic for Janeway lesions. She has no lesions on her hands. No splinter hemorrhages. She has absolutely no systemic symptoms whatsoever including fever, chills, malaise. Her murmur is extremely soft. I reviewed today in clinic with Dr. Johnsie Cancel and Dr. Harrington Challenger who recommend we obtain a CBC and ESR. They feel that in the absence of systemic symptoms it is extremely unlikely these represent Janeway lesions. They do  not not recommend blood cultures unless CBC or ESR are abnormal. She's not had any recent procedures which would increase her risk. Presuming CBC and ESR are normal, will refer to dermatology for evaluation. Will also update 2D echo as above. SBE prophylaxis indications reviewed with patient. 4. Carotid artery disease - overdue for recheck, will obtain. 5. Hyperlipidemia - increase Crestor back to 52m daily as previously recommended. F/u CMET/lipids in 6 weeks. If LDL >70 would recommend referral to lipid clinic. 6. Essential HTN - recent SBP's running borderline high. Given history, a goal BP closer to <130/80 would be ideal. Will increase losartan to 532mdaily and offer recheck BP in 1 week.  Disposition: F/u BP check in 1 week with APP/pharmD, then 1 year with Dr. TuRadford Pax Medication Adjustments/Labs and Tests Ordered: Current medicines are reviewed at length with the patient today.  Concerns regarding medicines are outlined above. Medication changes, Labs and Tests ordered today are summarized above and listed in the Patient Instructions accessible in Encounters.   Signed, DaCharlie PitterPA-C  09/17/2017 1:55 PM    [facility] [address]  [id number]: [phone]: [phone]

## 2017-09-17 NOTE — Telephone Encounter (Signed)
Mailed letter to pt re: missed labs.

## 2017-09-24 ENCOUNTER — Other Ambulatory Visit: Payer: Self-pay | Admitting: Physician Assistant

## 2017-09-24 ENCOUNTER — Ambulatory Visit (INDEPENDENT_AMBULATORY_CARE_PROVIDER_SITE_OTHER): Payer: PPO | Admitting: Pharmacist

## 2017-09-24 VITALS — BP 160/84 | HR 58 | Wt 217.0 lb

## 2017-09-24 DIAGNOSIS — Z953 Presence of xenogenic heart valve: Secondary | ICD-10-CM | POA: Diagnosis not present

## 2017-09-24 DIAGNOSIS — I10 Essential (primary) hypertension: Secondary | ICD-10-CM

## 2017-09-24 DIAGNOSIS — Z9889 Other specified postprocedural states: Secondary | ICD-10-CM | POA: Diagnosis not present

## 2017-09-24 DIAGNOSIS — I2510 Atherosclerotic heart disease of native coronary artery without angina pectoris: Secondary | ICD-10-CM | POA: Diagnosis not present

## 2017-09-24 DIAGNOSIS — E785 Hyperlipidemia, unspecified: Secondary | ICD-10-CM | POA: Diagnosis not present

## 2017-09-24 DIAGNOSIS — Z8679 Personal history of other diseases of the circulatory system: Secondary | ICD-10-CM | POA: Diagnosis not present

## 2017-09-24 LAB — COMPREHENSIVE METABOLIC PANEL
A/G RATIO: 1.5 (ref 1.2–2.2)
ALT: 13 IU/L (ref 0–32)
AST: 16 IU/L (ref 0–40)
Albumin: 4.1 g/dL (ref 3.5–4.8)
Alkaline Phosphatase: 70 IU/L (ref 39–117)
BUN/Creatinine Ratio: 11 — ABNORMAL LOW (ref 12–28)
BUN: 10 mg/dL (ref 8–27)
Bilirubin Total: 0.3 mg/dL (ref 0.0–1.2)
CO2: 22 mmol/L (ref 20–29)
CREATININE: 0.9 mg/dL (ref 0.57–1.00)
Calcium: 9.7 mg/dL (ref 8.7–10.3)
Chloride: 104 mmol/L (ref 96–106)
GFR, EST AFRICAN AMERICAN: 73 mL/min/{1.73_m2} (ref >59)
GFR, EST NON AFRICAN AMERICAN: 64 mL/min/{1.73_m2} (ref >59)
GLOBULIN, TOTAL: 2.7 g/dL (ref 1.5–4.5)
GLUCOSE: 76 mg/dL (ref 65–99)
POTASSIUM: 4.2 mmol/L (ref 3.5–5.2)
SODIUM: 141 mmol/L (ref 134–144)
Total Protein: 6.8 g/dL (ref 6.0–8.5)

## 2017-09-24 NOTE — Progress Notes (Signed)
Patient ID: REMMY CRASS                 DOB: 09/16/1946                      MRN: [id number]     HPI: Miranda Hutchinson is a 71 y.o. female patient of Dr. Radford Pax referred to HTN clinic by Melina Copa PA-C.  PMH includes CAD with 3 vessel CABG and aortic valve replacement in 2013, dyslipidemia, hypertension and hyperthyroidism. Patient present for initial HTN clinic evaluation. Losartan dose was increased from 25mg  daily to 50g daily on 09/17/2017.  Denies symptoms of dizziness, headaches, chest pain or swelling. Patient expressed concern about recent thyroid problems are recent and she is to have tyroid biopsy soon and her BP continues to rise.  Current HTN meds:  Losartan 50mg  daily Metoprolol 25mg  twice daily  Previously tried: none  BP goal: 130/80  Family History: none relevant to heart ; thyroid disease mother and daughter; lung cancer from mother  Social History: former smoker, denies alcohol, denies smokeless tobacco  Diet: "loves fried chicken" but trying to change to baked, low sodium,   Exercise: sedentary - plan to start [employer] again soon  Home BP readings: none available today; patient to get home BP monitor this week   Wt Readings from Last 3 Encounters:  09/24/17 217 lb (98.4 kg)  09/17/17 217 lb (98.4 kg)  11/03/16 213 lb (96.6 kg)   BP Readings from Last 3 Encounters:  09/24/17 (!) 160/84  09/17/17 (!) 142/74  11/03/16 (!) 146/74   Pulse Readings from Last 3 Encounters:  09/24/17 (!) 58  09/17/17 72  11/03/16 83    Renal function: CrCl cannot be calculated (Patient's most recent lab result is older than the maximum 21 days allowed.).  Past Medical History:  Diagnosis Date  . Aortic insufficiency    a. ascending aortic root aneursym with AI s/p Bentall procedure with bioprosthetic aortic valve 07/2012.  . Ascending aortic aneurysm ([place])    a. s/p Bentall 2013.  . Aspirin intolerance   . Bilateral carotid artery disease ([place])    a.  Last carotid duplex  2013 showed 40-59% distal RICA and 13-24% mLICA stenosis.  . Coronary artery disease    a. 3 vessel ASCAD s/p CABG (LIMA to LAD, SVG to OM, SVG to PDA) at time of Bentall 07/2012.  Marland Kitchen Dyslipidemia   . Heart murmur   . Hyperlipidemia   . Hyperthyroidism   . Hypothyroidism   . RBBB   . Renal insufficiency    a. Cr 1.31 in 2016, previously 1 range.    Current Outpatient Prescriptions on File Prior to Visit  Medication Sig Dispense Refill  . cycloSPORINE (RESTASIS) 0.05 % ophthalmic emulsion Place 1 drop into both eyes daily as needed (floaters).    . losartan (COZAAR) 50 MG tablet Take 1 tablet (50 mg total) by mouth daily. 30 tablet 1  . metoprolol tartrate (LOPRESSOR) 25 MG tablet Take 25 mg by mouth 2 (two) times daily.    . Multiple Vitamins-Minerals (ALIVE WOMENS 50+ PO) Take 1 tablet by mouth daily.    . rosuvastatin (CRESTOR) 40 MG tablet Take 1 tablet (40 mg total) by mouth daily. 30 tablet 1   No current facility-administered medications on file prior to visit.     Allergies  Allergen Reactions  . Asa [Aspirin] Nausea Only and Other (See Comments)    "hurts my stomach"  Blood pressure (!) 160/84, pulse (!) 58, weight 217 lb (98.4 kg).  Essential hypertension, benign Blood pressure remains above goal and higher than previous office readings. Recent thyroid problems and scheduled biopsy also noted. Will repeat BMET today and increase losartan to 100mg  daily if renal function remains stable. Patient instructed to monitor BP twice daily and bring records to next f/u visit in 3 weeks. Plan to repeat BMET in 2 week again.   Khalid Lacko Rodriguez-Guzman PharmD, BCPS, [place] [id number] 09/24/2017 3:24 PM

## 2017-09-24 NOTE — Assessment & Plan Note (Signed)
Blood pressure remains above goal and higher than previous office readings. Recent thyroid problems and scheduled biopsy also noted. Will repeat BMET today and increase losartan to 100mg  daily if renal function remains stable. Patient instructed to monitor BP twice daily and bring records to next f/u visit in 3 weeks. Plan to repeat BMET in 2 week again.

## 2017-09-24 NOTE — Patient Instructions (Addendum)
Return for a  follow up appointment in 3 weeks  Your blood pressure today is 160/84 pulse 58  Check your blood pressure  Twice daily at home daily (if able) and keep record of the readings.  Take your BP meds as follows: *INCREASE losartan to 100mg  daily - wait until blood work resulted before increasing dose *Continue all other medication as previously prescribed  Bring your BP cuff and your record of home blood pressures to your next appointment.  Exercise as you're able, try to walk approximately 30 minutes per day.  Keep salt intake to a minimum, especially watch canned and prepared boxed foods.  Eat more fresh fruits and vegetables and fewer canned items.  Avoid eating in fast food restaurants.    HOW TO TAKE YOUR BLOOD PRESSURE: . Rest 5 minutes before taking your blood pressure. .  Don't smoke or drink caffeinated beverages for at least 30 minutes before. . Take your blood pressure before (not after) you eat. . Sit comfortably with your back supported and both feet on the floor (don't cross your legs). . Elevate your arm to heart level on a table or a desk. . Use the proper sized cuff. It should fit smoothly and snugly around your bare upper arm. There should be enough room to slip a fingertip under the cuff. The bottom edge of the cuff should be 1 inch above the crease of the elbow. . Ideally, take 3 measurements at one sitting and record the average.

## 2017-09-28 ENCOUNTER — Ambulatory Visit: Payer: PPO | Admitting: Physician Assistant

## 2017-09-29 ENCOUNTER — Ambulatory Visit (HOSPITAL_COMMUNITY): Payer: PPO | Attending: Cardiology

## 2017-09-29 ENCOUNTER — Telehealth: Payer: Self-pay

## 2017-09-29 ENCOUNTER — Other Ambulatory Visit: Payer: Self-pay | Admitting: Obstetrics and Gynecology

## 2017-09-29 ENCOUNTER — Other Ambulatory Visit: Payer: Self-pay

## 2017-09-29 DIAGNOSIS — Z9889 Other specified postprocedural states: Secondary | ICD-10-CM

## 2017-09-29 DIAGNOSIS — I4510 Unspecified right bundle-branch block: Secondary | ICD-10-CM | POA: Insufficient documentation

## 2017-09-29 DIAGNOSIS — N289 Disorder of kidney and ureter, unspecified: Secondary | ICD-10-CM | POA: Diagnosis not present

## 2017-09-29 DIAGNOSIS — Z951 Presence of aortocoronary bypass graft: Secondary | ICD-10-CM | POA: Insufficient documentation

## 2017-09-29 DIAGNOSIS — I2510 Atherosclerotic heart disease of native coronary artery without angina pectoris: Secondary | ICD-10-CM | POA: Diagnosis not present

## 2017-09-29 DIAGNOSIS — Z8679 Personal history of other diseases of the circulatory system: Secondary | ICD-10-CM | POA: Diagnosis not present

## 2017-09-29 DIAGNOSIS — E785 Hyperlipidemia, unspecified: Secondary | ICD-10-CM | POA: Diagnosis not present

## 2017-09-29 DIAGNOSIS — E079 Disorder of thyroid, unspecified: Secondary | ICD-10-CM | POA: Diagnosis not present

## 2017-09-29 DIAGNOSIS — I517 Cardiomegaly: Secondary | ICD-10-CM | POA: Diagnosis not present

## 2017-09-29 DIAGNOSIS — Z953 Presence of xenogenic heart valve: Secondary | ICD-10-CM

## 2017-09-29 DIAGNOSIS — E041 Nontoxic single thyroid nodule: Secondary | ICD-10-CM

## 2017-09-29 MED ORDER — LOSARTAN POTASSIUM 100 MG PO TABS: 100.0000 mg | ORAL_TABLET | Freq: Every day | ORAL | 11 refills | Status: DC

## 2017-09-29 NOTE — Telephone Encounter (Signed)
Notes recorded by Rodriguez-Guzman, Raquel, RPH on 09/28/2017 at 10:46 AM EDT Okay to increase losartan to 100mg  daily as planned. Will f/u with HTN clinic in 2 weeks.   Informed patient of lab results and verbal understanding expressed.  Losartan 100 mg daily called in. She will keep OV with HTN Clinic 10/18. She was grateful for call and agrees with treatment plan.

## 2017-09-30 ENCOUNTER — Ambulatory Visit (HOSPITAL_COMMUNITY)
Admission: RE | Admit: 2017-09-30 | Discharge: 2017-09-30 | Disposition: A | Discharge status: Home / Self Care | Payer: PPO | Source: Ambulatory Visit | Attending: Cardiovascular Disease | Admitting: Cardiovascular Disease

## 2017-09-30 DIAGNOSIS — I739 Peripheral vascular disease, unspecified: Secondary | ICD-10-CM

## 2017-09-30 DIAGNOSIS — I6523 Occlusion and stenosis of bilateral carotid arteries: Secondary | ICD-10-CM | POA: Diagnosis not present

## 2017-09-30 DIAGNOSIS — I779 Disorder of arteries and arterioles, unspecified: Secondary | ICD-10-CM | POA: Diagnosis not present

## 2017-10-01 ENCOUNTER — Encounter: Payer: Self-pay | Admitting: Physician Assistant

## 2017-10-05 ENCOUNTER — Other Ambulatory Visit: Payer: PPO

## 2017-10-06 ENCOUNTER — Other Ambulatory Visit: Payer: Self-pay | Admitting: *Deleted

## 2017-10-06 ENCOUNTER — Other Ambulatory Visit (HOSPITAL_COMMUNITY)
Admission: RE | Admit: 2017-10-06 | Discharge: 2017-10-06 | Disposition: A | Discharge status: Home / Self Care | Payer: PPO | Source: Ambulatory Visit | Attending: Student | Admitting: Student

## 2017-10-06 ENCOUNTER — Ambulatory Visit
Admission: RE | Admit: 2017-10-06 | Discharge: 2017-10-06 | Disposition: A | Discharge status: Home / Self Care | Payer: PPO | Source: Ambulatory Visit | Attending: Obstetrics and Gynecology | Admitting: Obstetrics and Gynecology

## 2017-10-06 ENCOUNTER — Other Ambulatory Visit (INDEPENDENT_AMBULATORY_CARE_PROVIDER_SITE_OTHER): Payer: PPO

## 2017-10-06 DIAGNOSIS — E785 Hyperlipidemia, unspecified: Secondary | ICD-10-CM

## 2017-10-06 DIAGNOSIS — Z9889 Other specified postprocedural states: Secondary | ICD-10-CM

## 2017-10-06 DIAGNOSIS — I2510 Atherosclerotic heart disease of native coronary artery without angina pectoris: Secondary | ICD-10-CM

## 2017-10-06 DIAGNOSIS — Z8679 Personal history of other diseases of the circulatory system: Secondary | ICD-10-CM | POA: Diagnosis not present

## 2017-10-06 DIAGNOSIS — E041 Nontoxic single thyroid nodule: Secondary | ICD-10-CM | POA: Diagnosis not present

## 2017-10-06 DIAGNOSIS — Z953 Presence of xenogenic heart valve: Secondary | ICD-10-CM | POA: Diagnosis not present

## 2017-10-06 DIAGNOSIS — Z79899 Other long term (current) drug therapy: Secondary | ICD-10-CM

## 2017-10-07 LAB — CBC
HEMATOCRIT: 36.9 % (ref 34.0–46.6)
Hemoglobin: 12.4 g/dL (ref 11.1–15.9)
MCH: 29.1 pg (ref 26.6–33.0)
MCHC: 33.6 g/dL (ref 31.5–35.7)
MCV: 87 fL (ref 79–97)
Platelets: 248 10*3/uL (ref 150–379)
RBC: 4.26 x10E6/uL (ref 3.77–5.28)
RDW: 13.6 % (ref 12.3–15.4)
WBC: 6.2 10*3/uL (ref 3.4–10.8)

## 2017-10-07 LAB — COMPREHENSIVE METABOLIC PANEL
ALBUMIN: 4.2 g/dL (ref 3.5–4.8)
ALT: 14 IU/L (ref 0–32)
AST: 15 IU/L (ref 0–40)
Albumin/Globulin Ratio: 1.7 (ref 1.2–2.2)
Alkaline Phosphatase: 73 IU/L (ref 39–117)
BUN/Creatinine Ratio: 15 (ref 12–28)
BUN: 14 mg/dL (ref 8–27)
Bilirubin Total: 0.3 mg/dL (ref 0.0–1.2)
CALCIUM: 9.8 mg/dL (ref 8.7–10.3)
CHLORIDE: 107 mmol/L — AB (ref 96–106)
CO2: 24 mmol/L (ref 20–29)
CREATININE: 0.95 mg/dL (ref 0.57–1.00)
GFR, EST AFRICAN AMERICAN: 69 mL/min/{1.73_m2} (ref >59)
GFR, EST NON AFRICAN AMERICAN: 60 mL/min/{1.73_m2} (ref >59)
GLUCOSE: 86 mg/dL (ref 65–99)
Globulin, Total: 2.5 g/dL (ref 1.5–4.5)
Potassium: 4.2 mmol/L (ref 3.5–5.2)
Sodium: 143 mmol/L (ref 134–144)
TOTAL PROTEIN: 6.7 g/dL (ref 6.0–8.5)

## 2017-10-07 LAB — LIPID PANEL
Chol/HDL Ratio: 4.4 ratio (ref 0.0–4.4)
Cholesterol, Total: 176 mg/dL (ref 100–199)
HDL: 40 mg/dL (ref >39)
LDL CALC: 103 mg/dL — AB (ref 0–99)
Triglycerides: 166 mg/dL — ABNORMAL HIGH (ref 0–149)
VLDL CHOLESTEROL CAL: 33 mg/dL (ref 5–40)

## 2017-10-07 LAB — SEDIMENTATION RATE: Sed Rate: 17 mm/hr (ref 0–40)

## 2017-10-09 ENCOUNTER — Other Ambulatory Visit: Payer: Self-pay | Admitting: Cardiology

## 2017-10-09 DIAGNOSIS — E7800 Pure hypercholesterolemia, unspecified: Secondary | ICD-10-CM

## 2017-10-09 NOTE — Progress Notes (Signed)
Called pt to inform her that per Dr. Radford Pax, that pt needed to come in to have a lipid panel redone in 6 weeks, pt had an appt already for November 1, appt was cancelled and reschedule for November 23, to make it 6 weeks and orders were put in as well. I advised the pt that if she has any other problems, questions or concerns to call the office. Pt verbalized understanding.

## 2017-10-15 ENCOUNTER — Ambulatory Visit: Payer: PPO

## 2017-10-18 ENCOUNTER — Other Ambulatory Visit: Payer: Self-pay | Admitting: Physician Assistant

## 2017-10-19 ENCOUNTER — Ambulatory Visit
Admission: RE | Admit: 2017-10-19 | Discharge: 2017-10-19 | Disposition: A | Discharge status: Home / Self Care | Payer: PPO | Source: Ambulatory Visit | Attending: Obstetrics and Gynecology | Admitting: Obstetrics and Gynecology

## 2017-10-19 DIAGNOSIS — Z1231 Encounter for screening mammogram for malignant neoplasm of breast: Secondary | ICD-10-CM | POA: Diagnosis not present

## 2017-10-22 ENCOUNTER — Other Ambulatory Visit: Payer: Self-pay | Admitting: Physician Assistant

## 2017-10-22 DIAGNOSIS — D492 Neoplasm of unspecified behavior of bone, soft tissue, and skin: Secondary | ICD-10-CM | POA: Diagnosis not present

## 2017-10-22 DIAGNOSIS — D229 Melanocytic nevi, unspecified: Secondary | ICD-10-CM | POA: Diagnosis not present

## 2017-10-22 DIAGNOSIS — D2272 Melanocytic nevi of left lower limb, including hip: Secondary | ICD-10-CM | POA: Diagnosis not present

## 2017-10-29 ENCOUNTER — Other Ambulatory Visit: Payer: PPO

## 2017-11-02 DIAGNOSIS — E010 Iodine-deficiency related diffuse (endemic) goiter: Secondary | ICD-10-CM | POA: Diagnosis not present

## 2017-11-03 ENCOUNTER — Ambulatory Visit: Payer: PPO | Admitting: Endocrinology

## 2017-11-11 ENCOUNTER — Ambulatory Visit: Payer: PPO

## 2017-11-12 ENCOUNTER — Telehealth: Payer: Self-pay | Admitting: Cardiology

## 2017-11-12 DIAGNOSIS — R040 Epistaxis: Secondary | ICD-10-CM | POA: Diagnosis not present

## 2017-11-12 DIAGNOSIS — I10 Essential (primary) hypertension: Secondary | ICD-10-CM | POA: Diagnosis not present

## 2017-11-12 NOTE — Telephone Encounter (Signed)
New Message   Per family member in home patient currently having nose bleed  Pt c/o medication issue:  1. Name of Medication: losartan (COZAAR) 100 MG tablet  2. How are you currently taking this medication (dosage and times per day)? As prescribed  3. Are you having a reaction (difficulty breathing--STAT)? No  4. What is your medication issue? Patient having nose bleed

## 2017-11-12 NOTE — Telephone Encounter (Signed)
Spoke with pt and instructed pt to get some saline for nosebleed,  Also may purchase humidifier or go to bathroom and turn shower on to get some steam in air for moisture.The patient notified if nosebleed returns . Then may need referral to ent  Will forward to Dr Radford Pax .Adonis Housekeeper

## 2017-11-14 NOTE — Telephone Encounter (Signed)
Needs to see PCP

## 2017-11-16 NOTE — Telephone Encounter (Signed)
Left message for patient (DPR) that in addition to recommendations given last week she should follow up with PCP regarding her nosebleeds.  Advised to call back with any further concerns.

## 2017-11-20 ENCOUNTER — Other Ambulatory Visit: Payer: PPO

## 2017-11-23 ENCOUNTER — Other Ambulatory Visit: Payer: Self-pay | Admitting: Physician Assistant

## 2017-11-25 NOTE — Telephone Encounter (Signed)
Medication Detail    Disp Refills Start End   rosuvastatin (CRESTOR) 40 MG tablet 90 tablet 3 10/19/2017    Sig: TAKE 1 TABLET(40 MG) BY MOUTH DAILY   Sent to pharmacy as: rosuvastatin (CRESTOR) 40 MG tablet   Notes to Pharmacy: **Patient requests 90 days supply**   E-Prescribing Status: Receipt confirmed by pharmacy (10/19/2017 1:16 PM EDT)   Pharmacy   [id number] - [place], [place]

## 2017-12-01 ENCOUNTER — Ambulatory Visit: Payer: PPO

## 2017-12-01 ENCOUNTER — Other Ambulatory Visit: Payer: PPO

## 2017-12-01 NOTE — Progress Notes (Deleted)
Patient ID: Miranda Hutchinson                 DOB: [birth year]                      MRN: [id number]     HPI: Miranda Hutchinson is a 71 y.o. female patient of Dr. Radford Pax referred to HTN clinic by Melina Copa PA-C.  PMH includes CAD with 3 vessel CABG and aortic valve replacement in 2013, dyslipidemia, hypertension and hyperthyroidism. Patient present for initial HTN clinic evaluation. Losartan dose was increased from 50mg  daily to 100mg  daily on 09/24/2017.  Patient was due to follow up 10/15/2017 but appointment was cancelled. Renal function follow up done 10/06/2017 showed stable frenal function and electrolytes. Next follow up was scheduled for 11/01/2017 also cancelled by patient.   Denies symptoms of dizziness, headaches, chest pain or swelling. Patient expressed concern about recent thyroid problems are recent and she is to have tyroid biopsy soon and her BP continues to rise.  Current HTN meds:  Losartan 50mg  daily Metoprolol 25mg  twice daily  Previously tried: none  BP goal: 130/80  Family History: none relevant to heart ; thyroid disease mother and daughter; lung cancer from mother  Social History: former smoker, denies alcohol, denies smokeless tobacco  Diet: "loves fried chicken" but trying to change to baked, low sodium,   Exercise: sedentary - plan to start [employer] again soon  Home BP readings:   Wt Readings from Last 3 Encounters:  09/24/17 217 lb (98.4 kg)  09/17/17 217 lb (98.4 kg)  11/03/16 213 lb (96.6 kg)   BP Readings from Last 3 Encounters:  09/24/17 (!) 160/84  09/17/17 (!) 142/74  11/03/16 (!) 146/74   Pulse Readings from Last 3 Encounters:  09/24/17 (!) 58  09/17/17 72  11/03/16 83    Past Medical History:  Diagnosis Date  . Aortic insufficiency    a. ascending aortic root aneursym with AI s/p Bentall procedure with bioprosthetic aortic valve 07/2012.  . Ascending aortic aneurysm ([place])    a. s/p Bentall 2013.  . Aspirin intolerance   . Bilateral carotid  artery disease ([place])    a.  carotid duplex 2013 showed 40-59% distal RICA and 87-86% mLICA stenosis. b. Duplex 09/2017 - <40% BICA.  Marland Kitchen Coronary artery disease    a. 3 vessel ASCAD s/p CABG (LIMA to LAD, SVG to OM, SVG to PDA) at time of Bentall 07/2012.  Marland Kitchen Dyslipidemia   . Heart murmur   . Hyperlipidemia   . Hyperthyroidism   . Hypothyroidism   . RBBB   . Renal insufficiency    a. Cr 1.31 in 2016, previously 1 range.    Current Outpatient Medications on File Prior to Visit  Medication Sig Dispense Refill  . cycloSPORINE (RESTASIS) 0.05 % ophthalmic emulsion Place 1 drop into both eyes daily as needed (floaters).    . losartan (COZAAR) 100 MG tablet Take 1 tablet (100 mg total) by mouth daily. 30 tablet 11  . metoprolol tartrate (LOPRESSOR) 25 MG tablet Take 25 mg by mouth 2 (two) times daily.    . Multiple Vitamins-Minerals (ALIVE WOMENS 50+ PO) Take 1 tablet by mouth daily.    . rosuvastatin (CRESTOR) 40 MG tablet TAKE 1 TABLET(40 MG) BY MOUTH DAILY 90 tablet 3   No current facility-administered medications on file prior to visit.     Allergies  Allergen Reactions  . Asa [Aspirin] Nausea Only and Other (See Comments)    "  hurts my stomach"    There were no vitals taken for this visit.  No problem-specific Assessment & Plan notes found for this encounter.   Sophira Rumler Rodriguez-Guzman PharmD, BCPS, [place] [id number] 12/01/2017 8:31 AM

## 2017-12-16 ENCOUNTER — Other Ambulatory Visit: Payer: Self-pay | Admitting: Physician Assistant

## 2017-12-16 NOTE — Telephone Encounter (Signed)
Medication Detail    Disp Refills Start End   rosuvastatin (CRESTOR) 40 MG tablet 90 tablet 3 10/19/2017    Sig: TAKE 1 TABLET(40 MG) BY MOUTH DAILY   Sent to pharmacy as: rosuvastatin (CRESTOR) 40 MG tablet   Notes to Pharmacy: **Patient requests 90 days supply**   E-Prescribing Status: Receipt confirmed by pharmacy (10/19/2017 1:16 PM EDT)   Pharmacy   [id number] - [place], [place]

## 2018-01-05 DIAGNOSIS — B070 Plantar wart: Secondary | ICD-10-CM | POA: Diagnosis not present

## 2018-01-05 DIAGNOSIS — L821 Other seborrheic keratosis: Secondary | ICD-10-CM | POA: Diagnosis not present

## 2018-01-05 DIAGNOSIS — D229 Melanocytic nevi, unspecified: Secondary | ICD-10-CM | POA: Diagnosis not present

## 2018-01-28 ENCOUNTER — Ambulatory Visit (INDEPENDENT_AMBULATORY_CARE_PROVIDER_SITE_OTHER): Payer: PPO | Admitting: Cardiology

## 2018-01-28 ENCOUNTER — Encounter: Payer: Self-pay | Admitting: Cardiology

## 2018-01-28 VITALS — BP 126/74 | HR 76 | Ht 66.0 in | Wt 212.0 lb

## 2018-01-28 DIAGNOSIS — R0609 Other forms of dyspnea: Secondary | ICD-10-CM | POA: Diagnosis not present

## 2018-01-28 NOTE — Patient Instructions (Signed)
Medication Instructions:  Your physician recommends that you continue on your current medications as directed. Please refer to the Current Medication list given to you today.   Labwork: Bmet, Bnp, Cbc, Tsh today  Testing/Procedures: Your physician has requested that you have a lexiscan myoview. For further information please visit [email or web address]. Please follow instruction sheet, as given.   Follow-Up: Your physician recommends that you schedule a follow-up appointment in: 1-2 weeks   Any Other Special Instructions Will Be Listed Below (If Applicable).     If you need a refill on your cardiac medications before your next appointment, please call your pharmacy.

## 2018-01-28 NOTE — Progress Notes (Signed)
01/28/2018 Miranda Hutchinson   1946/10/10  [id number]  Primary Physician Antony Contras, MD Primary Cardiologist: Dr. Radford Pax   Reason for Visit/CC: Exertional Fatigue   HPI:  Miranda Hutchinson is a 72 y/o female, followed by Dr. Radford Pax, with a h/o severe 3 vessel ASCAD s/p CABG (LIMA to LAD, SVG to OM, SVG to PDA), ascending aortic root aneursym with AI s/p Bentall procedure 07/2012 and dyslipidemia. She also has bilateral carotid artery disease. Last carotid dopplers 09/2017 showed stable < 40% bilateral ICA stenosis. Pt also had an echocardiogram 09/2017 that showed normal LVEF, 55-60%, G1DD. Wall motion normal. AVR not well visualized with normal mean gradient of 16 mmHg. No AI noted.   She is back in clinic today given new exertional fatigue and some mild dyspnea. She feels tired with minimal activity. Similar symptoms pre CABG but not as severe. She denies CP. No LEE, orthopnea, PND, LEE or dizziness. No melena or hematochezia.    Cardiac Studies  2D Echo 09/2017 Study Conclusions  - Left ventricle: The cavity size was normal. Wall thickness was   increased in a pattern of mild LVH. Systolic function was normal.   The estimated ejection fraction was in the range of 55% to 60%.   Wall motion was normal; there were no regional wall motion   abnormalities. Doppler parameters are consistent with abnormal   left ventricular relaxation (grade 1 diastolic dysfunction). - Aortic valve: A bioprosthesis was present.  Impressions:  - Normal LV systolic function; mild LVH; mild diastolic   dysfunction; s/p AVR (not well visualized with normal maen   gradient of 16 mmHg); no AI.  Current Meds  Medication Sig  . cycloSPORINE (RESTASIS) 0.05 % ophthalmic emulsion Place 1 drop into both eyes daily as needed (floaters).  . losartan (COZAAR) 100 MG tablet Take 1 tablet (100 mg total) by mouth daily.  . metoprolol tartrate (LOPRESSOR) 25 MG tablet Take 25 mg by mouth 2 (two) times daily.  .  Multiple Vitamins-Minerals (ALIVE WOMENS 50+ PO) Take 1 tablet by mouth daily.  . rosuvastatin (CRESTOR) 40 MG tablet TAKE 1 TABLET(40 MG) BY MOUTH DAILY   Allergies  Allergen Reactions  . Asa [Aspirin] Nausea Only and Other (See Comments)    "hurts my stomach"   Past Medical History:  Diagnosis Date  . Aortic insufficiency    a. ascending aortic root aneursym with AI s/p Bentall procedure with bioprosthetic aortic valve 07/2012.  . Ascending aortic aneurysm ([place])    a. s/p Bentall 2013.  . Aspirin intolerance   . Bilateral carotid artery disease ([place])    a.  carotid duplex 2013 showed 40-59% distal RICA and 85-27% mLICA stenosis. b. Duplex 09/2017 - <40% BICA.  Marland Kitchen Coronary artery disease    a. 3 vessel ASCAD s/p CABG (LIMA to LAD, SVG to OM, SVG to PDA) at time of Bentall 07/2012.  Marland Kitchen Dyslipidemia   . Heart murmur   . Hyperlipidemia   . Hyperthyroidism   . Hypothyroidism   . RBBB   . Renal insufficiency    a. Cr 1.31 in 2016, previously 1 range.   Family History  Problem Relation Age of Onset  . Lung cancer Mother   . Thyroid disease Mother    Past Surgical History:  Procedure Laterality Date  . ABDOMINAL HYSTERECTOMY  1990's  . AORTIC VALVE REPLACEMENT  08/10/2012   Procedure: AORTIC VALVE REPLACEMENT (AVR);  Surgeon: Grace Isaac, MD;  Location: [facility];  Service: Open Heart Surgery;  Laterality: N/A;  . BREAST EXCISIONAL BIOPSY Left   . CARDIAC CATHETERIZATION  08/04/2012  . CORONARY ARTERY BYPASS GRAFT  08/10/2012   Procedure: CORONARY ARTERY BYPASS GRAFTING (CABG);  Surgeon: Grace Isaac, MD;  Location: [facility];  Service: Open Heart Surgery;  Laterality: N/A;  . THORACIC AORTIC ANEURYSM REPAIR  08/10/2012   Procedure: THORACIC ASCENDING ANEURYSM REPAIR (AAA);  Surgeon: Grace Isaac, MD;  Location: [facility];  Service: Open Heart Surgery;  Laterality: N/A;  . TONSILLECTOMY     "I was a little child"   Social History   Socioeconomic History  . Marital status:  Widowed    Spouse name: Not on file  . Number of children: Not on file  . Years of education: Not on file  . Highest education level: Not on file  Social Needs  . Financial resource strain: Not on file  . Food insecurity - worry: Not on file  . Food insecurity - inability: Not on file  . Transportation needs - medical: Not on file  . Transportation needs - non-medical: Not on file  Occupational History  . Not on file  Tobacco Use  . Smoking status: Former Smoker    Packs/day: 0.05    Years: 2.00    Pack years: 0.10    Types: Cigarettes    Last attempt to quit: 12/30/1983    Years since quitting: 34.1  . Smokeless tobacco: Never Used  Substance and Sexual Activity  . Alcohol use: No  . Drug use: No  . Sexual activity: No  Other Topics Concern  . Not on file  Social History Narrative  . Not on file     Review of Systems: General: negative for chills, fever, night sweats or weight changes.  Cardiovascular: negative for chest pain, dyspnea on exertion, edema, orthopnea, palpitations, paroxysmal nocturnal dyspnea or shortness of breath Dermatological: negative for rash Respiratory: negative for cough or wheezing Urologic: negative for hematuria Abdominal: negative for nausea, vomiting, diarrhea, bright red blood per rectum, melena, or hematemesis Neurologic: negative for visual changes, syncope, or dizziness All other systems reviewed and are otherwise negative except as noted above.   Physical Exam:  Blood pressure 126/74, pulse 76, height 5\' 6"  (1.676 m), weight 212 lb (96.2 kg), SpO2 98 %.  General appearance: alert, cooperative and no distress Neck: no JVD Lungs: clear to auscultation bilaterally Heart: regular rate and rhythm, 2/6 SM Extremities: extremities normal, atraumatic, no cyanosis or edema Pulses: 2+ and symmetric Skin: Skin color, texture, turgor normal. No rashes or lesions Neurologic: Grossly normal  EKG NSR with RBBB unchanged from previous --  personally reviewed   ASSESSMENT AND PLAN:   1. Exertional Fatigue: symptoms are a bit similar to pre CABG symptoms but not as severe. No associated CP. Mild exertional dyspnea but volume stable on exam. Recent echo 09/2017 showed normal LVEF, wall motion and no AI. Given her known CAD and h/o CABG, we will order a NST to r/o underling coronary ischemia. We will also check labs including a BNP, CBC, BMP and TSH.   2. CAD: h/o CABG in 2013. No CP but ordering NST given exertional fatigue.   3. H/o ascending aortic root aneursym with AI: s/p Bentall procedure 07/2012. Recent echo 09/2017 stable with no AI.   4. HTN: controlled on current regimen.   5. DLD: on statin therapy with Crestor.    6. Bilateral Carotid Artery Disease: stable dopplers 09/2017. < 40% bilateral ICA disease.    Follow-Up  after stress test.   Nelida Gores, MHS [facility] 01/28/2018 4:00 PM

## 2018-01-29 LAB — CBC WITH DIFFERENTIAL/PLATELET
BASOS: 1 %
Basophils Absolute: 0 10*3/uL (ref 0.0–0.2)
EOS (ABSOLUTE): 0.3 10*3/uL (ref 0.0–0.4)
EOS: 4 %
HEMATOCRIT: 36.2 % (ref 34.0–46.6)
HEMOGLOBIN: 11.7 g/dL (ref 11.1–15.9)
IMMATURE GRANS (ABS): 0 10*3/uL (ref 0.0–0.1)
IMMATURE GRANULOCYTES: 0 %
LYMPHS: 37 %
Lymphocytes Absolute: 2.5 10*3/uL (ref 0.7–3.1)
MCH: 28.5 pg (ref 26.6–33.0)
MCHC: 32.3 g/dL (ref 31.5–35.7)
MCV: 88 fL (ref 79–97)
MONOCYTES: 10 %
MONOS ABS: 0.7 10*3/uL (ref 0.1–0.9)
NEUTROS PCT: 48 %
Neutrophils Absolute: 3.2 10*3/uL (ref 1.4–7.0)
Platelets: 346 10*3/uL (ref 150–379)
RBC: 4.11 x10E6/uL (ref 3.77–5.28)
RDW: 14.2 % (ref 12.3–15.4)
WBC: 6.7 10*3/uL (ref 3.4–10.8)

## 2018-01-29 LAB — BASIC METABOLIC PANEL
BUN/Creatinine Ratio: 17 (ref 12–28)
BUN: 19 mg/dL (ref 8–27)
CO2: 24 mmol/L (ref 20–29)
CREATININE: 1.12 mg/dL — AB (ref 0.57–1.00)
Calcium: 9.6 mg/dL (ref 8.7–10.3)
Chloride: 104 mmol/L (ref 96–106)
GFR calc Af Amer: 56 mL/min/{1.73_m2} — ABNORMAL LOW (ref >59)
GFR, EST NON AFRICAN AMERICAN: 49 mL/min/{1.73_m2} — AB (ref >59)
Glucose: 87 mg/dL (ref 65–99)
Potassium: 4.9 mmol/L (ref 3.5–5.2)
SODIUM: 144 mmol/L (ref 134–144)

## 2018-01-29 LAB — PRO B NATRIURETIC PEPTIDE: NT-Pro BNP: 67 pg/mL (ref 0–301)

## 2018-01-29 LAB — TSH: TSH: 0.421 u[IU]/mL — ABNORMAL LOW (ref 0.450–4.500)

## 2018-02-02 ENCOUNTER — Telehealth (HOSPITAL_COMMUNITY): Payer: Self-pay | Admitting: *Deleted

## 2018-02-02 NOTE — Telephone Encounter (Signed)
Left message on voicemail per DPR in reference to upcoming appointment scheduled on 02/05/18 with detailed instructions given per Myocardial Perfusion Study Information Sheet for the test. LM to arrive 15 minutes early, and that it is imperative to arrive on time for appointment to keep from having the test rescheduled. If you need to cancel or reschedule your appointment, please call the office within 24 hours of your appointment. Failure to do so may result in a cancellation of your appointment, and a $50 no show fee. Phone number given for call back for any questions. Kirstie Peri

## 2018-02-04 ENCOUNTER — Telehealth: Payer: Self-pay | Admitting: *Deleted

## 2018-02-04 DIAGNOSIS — Z79899 Other long term (current) drug therapy: Secondary | ICD-10-CM

## 2018-02-04 NOTE — Telephone Encounter (Signed)
-----   Message from Consuelo Pandy, [place] sent at 01/29/2018  2:33 PM EST ----- No anemia. Hgb is normal. Possible mild dehydration. Encourage pt to stay hydrated with fluids. Thyroid test was mildly abnormal. I recommend we recheck level when pt returns for stress test and also add free T3/T4. Thyroid dysfunction can also cause fatigue, thus we need to reassess this. Please notify pt and instruct her to get additional labs when she comes in for stress test.

## 2018-02-04 NOTE — Telephone Encounter (Signed)
Several attempts to reach pt by phone. Left message for pt that when she comes in for her stress test tomorrow, 02/05/18, to also go to the lab. Have left several messages for pt with no return call. Will put orders in .

## 2018-02-05 ENCOUNTER — Encounter (HOSPITAL_COMMUNITY): Payer: PPO

## 2018-02-05 ENCOUNTER — Other Ambulatory Visit: Payer: PPO

## 2018-02-08 ENCOUNTER — Ambulatory Visit: Payer: PPO | Admitting: Cardiology

## 2018-02-09 ENCOUNTER — Telehealth (HOSPITAL_COMMUNITY): Payer: Self-pay | Admitting: Cardiology

## 2018-02-09 NOTE — Telephone Encounter (Signed)
Called pt and spoke with her to reschedule the myoview that was missed on 02/05/18. She stated that she needed to go to her primary care doctor first to be evaluated for other medical issues first and then she would follow up to have this test done.

## 2018-02-12 ENCOUNTER — Encounter: Payer: Self-pay | Admitting: *Deleted

## 2018-02-15 DIAGNOSIS — I10 Essential (primary) hypertension: Secondary | ICD-10-CM | POA: Diagnosis not present

## 2018-02-15 DIAGNOSIS — E0590 Thyrotoxicosis, unspecified without thyrotoxic crisis or storm: Secondary | ICD-10-CM | POA: Diagnosis not present

## 2018-02-15 DIAGNOSIS — R252 Cramp and spasm: Secondary | ICD-10-CM | POA: Diagnosis not present

## 2018-04-13 DIAGNOSIS — M5432 Sciatica, left side: Secondary | ICD-10-CM | POA: Diagnosis not present

## 2018-04-13 DIAGNOSIS — I2510 Atherosclerotic heart disease of native coronary artery without angina pectoris: Secondary | ICD-10-CM | POA: Diagnosis not present

## 2018-04-13 DIAGNOSIS — R399 Unspecified symptoms and signs involving the genitourinary system: Secondary | ICD-10-CM | POA: Diagnosis not present

## 2018-04-13 DIAGNOSIS — Z0000 Encounter for general adult medical examination without abnormal findings: Secondary | ICD-10-CM | POA: Diagnosis not present

## 2018-04-13 DIAGNOSIS — R252 Cramp and spasm: Secondary | ICD-10-CM | POA: Diagnosis not present

## 2018-04-13 DIAGNOSIS — R079 Chest pain, unspecified: Secondary | ICD-10-CM | POA: Diagnosis not present

## 2018-04-13 DIAGNOSIS — E0590 Thyrotoxicosis, unspecified without thyrotoxic crisis or storm: Secondary | ICD-10-CM | POA: Diagnosis not present

## 2018-04-13 DIAGNOSIS — E7800 Pure hypercholesterolemia, unspecified: Secondary | ICD-10-CM | POA: Diagnosis not present

## 2018-04-13 DIAGNOSIS — Z1389 Encounter for screening for other disorder: Secondary | ICD-10-CM | POA: Diagnosis not present

## 2018-04-13 DIAGNOSIS — K219 Gastro-esophageal reflux disease without esophagitis: Secondary | ICD-10-CM | POA: Diagnosis not present

## 2018-04-13 DIAGNOSIS — R040 Epistaxis: Secondary | ICD-10-CM | POA: Diagnosis not present

## 2018-04-13 DIAGNOSIS — I10 Essential (primary) hypertension: Secondary | ICD-10-CM | POA: Diagnosis not present

## 2018-04-14 ENCOUNTER — Emergency Department (HOSPITAL_COMMUNITY)
Admission: EM | Admit: 2018-04-14 | Discharge: 2018-04-14 | Disposition: A | Discharge status: Home / Self Care | Payer: PPO | Attending: Emergency Medicine | Admitting: Emergency Medicine

## 2018-04-14 ENCOUNTER — Other Ambulatory Visit: Payer: Self-pay

## 2018-04-14 ENCOUNTER — Telehealth: Payer: Self-pay

## 2018-04-14 ENCOUNTER — Encounter (HOSPITAL_COMMUNITY): Payer: Self-pay

## 2018-04-14 ENCOUNTER — Telehealth: Payer: Self-pay | Admitting: Cardiology

## 2018-04-14 ENCOUNTER — Encounter (HOSPITAL_COMMUNITY): Payer: Self-pay | Admitting: Emergency Medicine

## 2018-04-14 ENCOUNTER — Emergency Department (HOSPITAL_COMMUNITY)
Admission: EM | Admit: 2018-04-14 | Discharge: 2018-04-14 | Disposition: A | Discharge status: Home / Self Care | Payer: PPO | Source: Home / Self Care | Attending: Emergency Medicine | Admitting: Emergency Medicine

## 2018-04-14 DIAGNOSIS — I2510 Atherosclerotic heart disease of native coronary artery without angina pectoris: Secondary | ICD-10-CM

## 2018-04-14 DIAGNOSIS — I10 Essential (primary) hypertension: Secondary | ICD-10-CM

## 2018-04-14 DIAGNOSIS — N390 Urinary tract infection, site not specified: Secondary | ICD-10-CM | POA: Insufficient documentation

## 2018-04-14 DIAGNOSIS — M545 Low back pain: Secondary | ICD-10-CM | POA: Insufficient documentation

## 2018-04-14 DIAGNOSIS — Z79899 Other long term (current) drug therapy: Secondary | ICD-10-CM

## 2018-04-14 DIAGNOSIS — R112 Nausea with vomiting, unspecified: Secondary | ICD-10-CM | POA: Insufficient documentation

## 2018-04-14 DIAGNOSIS — Z5321 Procedure and treatment not carried out due to patient leaving prior to being seen by health care provider: Secondary | ICD-10-CM | POA: Insufficient documentation

## 2018-04-14 DIAGNOSIS — Z87891 Personal history of nicotine dependence: Secondary | ICD-10-CM

## 2018-04-14 LAB — URINALYSIS, ROUTINE W REFLEX MICROSCOPIC
Bilirubin Urine: NEGATIVE
Glucose, UA: NEGATIVE mg/dL
Ketones, ur: NEGATIVE mg/dL
Nitrite: POSITIVE — AB
Protein, ur: 100 mg/dL — AB
Specific Gravity, Urine: 1.018 (ref 1.005–1.030)
pH: 6 (ref 5.0–8.0)

## 2018-04-14 LAB — CBC
HCT: 38.2 % (ref 36.0–46.0)
Hemoglobin: 12.5 g/dL (ref 12.0–15.0)
MCH: 28.4 pg (ref 26.0–34.0)
MCHC: 32.7 g/dL (ref 30.0–36.0)
MCV: 86.8 fL (ref 78.0–100.0)
Platelets: 260 10*3/uL (ref 150–400)
RBC: 4.4 MIL/uL (ref 3.87–5.11)
RDW: 14.6 % (ref 11.5–15.5)
WBC: 13.6 10*3/uL — ABNORMAL HIGH (ref 4.0–10.5)

## 2018-04-14 LAB — COMPREHENSIVE METABOLIC PANEL
ALT: 14 U/L (ref 14–54)
AST: 18 U/L (ref 15–41)
Albumin: 3.6 g/dL (ref 3.5–5.0)
Alkaline Phosphatase: 70 U/L (ref 38–126)
Anion gap: 10 (ref 5–15)
BUN: 19 mg/dL (ref 6–20)
CO2: 23 mmol/L (ref 22–32)
Calcium: 9.6 mg/dL (ref 8.9–10.3)
Chloride: 107 mmol/L (ref 101–111)
Creatinine, Ser: 1.68 mg/dL — ABNORMAL HIGH (ref 0.44–1.00)
GFR calc Af Amer: 33 mL/min — ABNORMAL LOW (ref >60)
GFR calc non Af Amer: 29 mL/min — ABNORMAL LOW (ref >60)
Glucose, Bld: 131 mg/dL — ABNORMAL HIGH (ref 65–99)
Potassium: 4.5 mmol/L (ref 3.5–5.1)
Sodium: 140 mmol/L (ref 135–145)
Total Bilirubin: 0.8 mg/dL (ref 0.3–1.2)
Total Protein: 8.4 g/dL — ABNORMAL HIGH (ref 6.5–8.1)

## 2018-04-14 LAB — LIPASE, BLOOD: Lipase: 20 U/L (ref 11–51)

## 2018-04-14 MED ORDER — OXYCODONE-ACETAMINOPHEN 5-325 MG PO TABS
1.0000 | ORAL_TABLET | Freq: Once | ORAL | Status: DC
  Filled 2018-04-14: qty 1

## 2018-04-14 MED ORDER — OXYCODONE-ACETAMINOPHEN 5-325 MG PO TABS: 1.0000 | ORAL_TABLET | Freq: Four times a day (QID) | ORAL | 0 refills | Status: DC | PRN

## 2018-04-14 MED ORDER — ONDANSETRON 4 MG PO TBDP
4.0000 mg | ORAL_TABLET | Freq: Once | ORAL | Status: AC
  Administered 2018-04-14: 4 mg via ORAL
  Filled 2018-04-14: qty 1

## 2018-04-14 NOTE — ED Triage Notes (Signed)
Pt reports that she has ASA allergy and states that she  took a Excedrin Migraine last night  for pain in her left side lower back/Flank and realized that it had ASA in it. Pt today reports ever since she took the medication she reports that she is now having left side pain lower abdominal pain.  Pt does reports nausea and vomiting Pt reports that she was recently dx with a UTI and has not picked up her ABX at this time.

## 2018-04-14 NOTE — ED Notes (Signed)
No answer when called for vitals. 

## 2018-04-14 NOTE — ED Triage Notes (Signed)
Pt states she also was diagnosed with a UTI today at her doctors office and was prescribed antibiotics but has not taken them due to the nausea.

## 2018-04-14 NOTE — Telephone Encounter (Signed)
SENT REFERRAL TO SCHEDULING 

## 2018-04-14 NOTE — ED Triage Notes (Signed)
Pt reports having left lower back pain and taking an excedrin from a friend at church without realizing that it had ASA in it. Pt is allergic to ASA with a reaction type of nausea, abdominal pain and vomiting. Pt states that "since it hit my stomach ive been throwing up"

## 2018-04-14 NOTE — ED Provider Notes (Signed)
[facility] DEPT Provider Note   CSN: [id number] Arrival date & time: 04/14/18  1617     History   Chief Complaint Chief Complaint  Patient presents with  . Abdominal Pain  . Medication Reaction    HPI Miranda Hutchinson is a 72 y.o. female.  HPI   72 year old female with lower abdominal left mid back pain.  Symptom onset a couple days ago.  Few days ago she had a headache.  She reports that she took Excedrin Migraine before realizing that it had aspirin in it which she reports an allergy to.  Her abdominal pain started around then and she felt like it might be secondary to the aspirin.  Noticed pain in her left mid back since around that time 2.  Also associated dysuria.  She reports that she had a urinalysis done as an outpatient and was put on antibiotics for UTI.  Refill this prescription but is yet to start them.  No fevers or chills.  No nausea or vomiting.  No leg weakness or incontinence.  Past Medical History:  Diagnosis Date  . Aortic insufficiency    a. ascending aortic root aneursym with AI s/p Bentall procedure with bioprosthetic aortic valve 07/2012.  . Ascending aortic aneurysm ([place])    a. s/p Bentall 2013.  . Aspirin intolerance   . Bilateral carotid artery disease ([place])    a.  carotid duplex 2013 showed 40-59% distal RICA and 93-26% mLICA stenosis. b. Duplex 09/2017 - <40% BICA.  Marland Kitchen Coronary artery disease    a. 3 vessel ASCAD s/p CABG (LIMA to LAD, SVG to OM, SVG to PDA) at time of Bentall 07/2012.  Marland Kitchen Dyslipidemia   . Heart murmur   . Hyperlipidemia   . Hyperthyroidism   . Hypothyroidism   . RBBB   . Renal insufficiency    a. Cr 1.31 in 2016, previously 1 range.    Patient Active Problem List   Diagnosis Date Noted  . Multinodular goiter 09/12/2015  . Hyperthyroidism 09/12/2015  . Dizziness 07/03/2014  . Blurred vision 07/03/2014  . Headache(784.0) 07/03/2014  . S/P CABG x 3 08/16/2012  . S/P ascending aortic replacement  08/16/2012  . S/P AVR (aortic valve replacement) 08/16/2012  . Essential hypertension, benign 08/05/2012  . Pure hypercholesterolemia 08/05/2012  . Obesity, unspecified 08/05/2012  . Coronary artery disease 08/04/2012  . Aortic valve insufficiency 08/04/2012  . Dilated aortic root ([place]) 08/04/2012    Past Surgical History:  Procedure Laterality Date  . ABDOMINAL HYSTERECTOMY  1990's  . AORTIC VALVE REPLACEMENT  08/10/2012   Procedure: AORTIC VALVE REPLACEMENT (AVR);  Surgeon: Grace Isaac, MD;  Location: [place];  Service: Open Heart Surgery;  Laterality: N/A;  . BREAST EXCISIONAL BIOPSY Left   . CARDIAC CATHETERIZATION  08/04/2012  . CORONARY ARTERY BYPASS GRAFT  08/10/2012   Procedure: CORONARY ARTERY BYPASS GRAFTING (CABG);  Surgeon: Grace Isaac, MD;  Location: [place];  Service: Open Heart Surgery;  Laterality: N/A;  . THORACIC AORTIC ANEURYSM REPAIR  08/10/2012   Procedure: THORACIC ASCENDING ANEURYSM REPAIR (AAA);  Surgeon: Grace Isaac, MD;  Location: [place];  Service: Open Heart Surgery;  Laterality: N/A;  . TONSILLECTOMY     "I was a little child"     OB History   None      Home Medications    Prior to Admission medications   Medication Sig Start Date End Date Taking? Authorizing Provider  aspirin-acetaminophen-caffeine ([place]) [phone] MG  tablet Take 1 tablet by mouth every 6 (six) hours as needed for headache.   Yes [provider]  cycloSPORINE (RESTASIS) 0.05 % ophthalmic emulsion Place 1 drop into both eyes 2 (two) times daily.    Yes [provider]  losartan (COZAAR) 100 MG tablet Take 1 tablet (100 mg total) by mouth daily. 09/29/17 09/24/18 Yes Turner, Eber Hong, MD  metoprolol tartrate (LOPRESSOR) 25 MG tablet Take 25 mg by mouth 2 (two) times daily.   Yes [provider]  Multiple Vitamins-Minerals (ALIVE WOMENS 50+ PO) Take 1 tablet by mouth daily.   Yes [provider]  rosuvastatin (CRESTOR) 40 MG  tablet TAKE 1 TABLET(40 MG) BY MOUTH DAILY 10/19/17  Yes Dunn, Dayna N, PA-C  cephALEXin (KEFLEX) 500 MG capsule Take 500 mg by mouth 2 (two) times daily.  04/13/18   [provider]    Family History Family History  Problem Relation Age of Onset  . Lung cancer Mother   . Thyroid disease Mother     Social History Social History   Tobacco Use  . Smoking status: Former Smoker    Packs/day: 0.05    Years: 2.00    Pack years: 0.10    Types: Cigarettes    Last attempt to quit: 12/30/1983    Years since quitting: 34.3  . Smokeless tobacco: Never Used  Substance Use Topics  . Alcohol use: No  . Drug use: No     Allergies   Asa [aspirin]   Review of Systems Review of Systems  All systems reviewed and negative, other than as noted in HPI.  Physical Exam Updated Vital Signs BP 126/64 (BP Location: Left Arm)   Pulse (!) 120   Temp 98.5 F (36.9 C) (Oral)   Resp 18   SpO2 97%   Physical Exam  Constitutional: She appears well-developed and well-nourished. No distress.  HENT:  Head: Normocephalic and atraumatic.  Eyes: Conjunctivae are normal. Right eye exhibits no discharge. Left eye exhibits no discharge.  Neck: Neck supple.  Cardiovascular: Normal rate, regular rhythm and normal heart sounds. Exam reveals no gallop and no friction rub.  No murmur heard. Pulmonary/Chest: Effort normal and breath sounds normal. No respiratory distress.  Abdominal: Soft. She exhibits no distension. There is tenderness.  Suprapubic tenderness without rebound or guarding.  Left CVA tenderness.  Musculoskeletal: She exhibits no edema or tenderness.  Neurological: She is alert.  Skin: Skin is warm and dry.  Psychiatric: She has a normal mood and affect. Her behavior is normal. Thought content normal.  Nursing note and vitals reviewed.    ED Treatments / Results  Labs (all labs ordered are listed, but only abnormal results are displayed) Labs Reviewed  COMPREHENSIVE METABOLIC  PANEL - Abnormal; Notable for the following components:      Result Value   Glucose, Bld 131 (*)    Creatinine, Ser 1.68 (*)    Total Protein 8.4 (*)    GFR calc non Af Amer 29 (*)    GFR calc Af Amer 33 (*)    All other components within normal limits  CBC - Abnormal; Notable for the following components:   WBC 13.6 (*)    All other components within normal limits  URINALYSIS, ROUTINE W REFLEX MICROSCOPIC - Abnormal; Notable for the following components:   APPearance HAZY (*)    Hgb urine dipstick MODERATE (*)    Protein, ur 100 (*)    Nitrite POSITIVE (*)    Leukocytes, UA  MODERATE (*)    Bacteria, UA FEW (*)    Squamous Epithelial / LPF 6-30 (*)    All other components within normal limits  URINE CULTURE  LIPASE, BLOOD    EKG None  Radiology No results found.  Procedures Procedures (including critical care time)  Medications Ordered in ED Medications  oxyCODONE-acetaminophen (PERCOCET/ROXICET) 5-325 MG per tablet 1 tablet (has no administration in time range)     Initial Impression / Assessment and Plan / ED Course  I have reviewed the triage vital signs and the nursing notes.  Pertinent labs & imaging results that were available during my care of the patient were reviewed by me and considered in my medical decision making (see chart for details).     72 year old female with left thoracic pain and some suprapubic pain.  She is concerned might be secondary to allergic reaction to aspirin.  I doubt it.  This is likely secondary to UTI.  Recently diagnosed by PCP but she is yet to start her antibiotics.  She says she has a prescription waiting for her at home to start.  We will send the urine culture.  Some renal impairment noted.  Patient was instructed to drink plenty of fluids for the next few days.  She is mildly tachycardic but she is afebrile and actually appears quite well.  We will give her some pain meds for the next couple days.  Advised to stop antibiotics  soon if she gets home.  Return precautions were discussed.  Outpatient follow-up with her PCP otherwise.  Final Clinical Impressions(s) / ED Diagnoses   Final diagnoses:  None    ED Discharge Orders    None       Virgel Manifold, MD 04/17/18 1004

## 2018-04-14 NOTE — Discharge Instructions (Addendum)
Drink at least 8 8 ounce cups of water for the next 3 days.  Take your antibiotics as soon as you get home.  Take prescribed pain medicine as needed.  Follow-up with PCP.  Return to the emergency room for worsening pain, fever or nausea/vomiting.

## 2018-04-14 NOTE — Telephone Encounter (Signed)
Left message to call back  

## 2018-04-14 NOTE — Telephone Encounter (Signed)
New message  Original call from [place] asking about new referral for patient to be seen asap, urgent referral. Offered to schedule an appointment, however the office did not know why the patient needed to be seen asap and suggested I call the patient. I called the patient and she was crying because of pain associated with recent allergic reaction to Aspirin. Patient on her way to urgent care. Patient also requesting to switch from seeing Dr Radford Pax.  Patient also requesting to not be scheduled with [facility].     Pt c/o of Chest Pain: STAT if CP now or developed within 24 hours  1. Are you having CP right now? NO  2. Are you experiencing any other symptoms (ex. SOB, nausea, vomiting, sweating)? NO  3. How long have you been experiencing CP? Once a week for about 3 weeks  4. Is your CP continuous or coming and going? Coming and going  5. Have you taken Nitroglycerin? NO ?

## 2018-04-16 NOTE — Telephone Encounter (Signed)
Patient seen in the ED on 04/14/18. Will close encounter.

## 2018-04-17 LAB — URINE CULTURE: Culture: >=100000 — AB

## 2018-04-23 ENCOUNTER — Ambulatory Visit: Payer: PPO | Admitting: Internal Medicine

## 2018-04-28 DIAGNOSIS — N958 Other specified menopausal and perimenopausal disorders: Secondary | ICD-10-CM | POA: Diagnosis not present

## 2018-05-04 ENCOUNTER — Other Ambulatory Visit: Payer: Self-pay

## 2018-05-04 ENCOUNTER — Encounter (HOSPITAL_COMMUNITY): Payer: Self-pay | Admitting: Emergency Medicine

## 2018-05-04 DIAGNOSIS — Z79899 Other long term (current) drug therapy: Secondary | ICD-10-CM | POA: Diagnosis not present

## 2018-05-04 DIAGNOSIS — E876 Hypokalemia: Secondary | ICD-10-CM | POA: Diagnosis present

## 2018-05-04 DIAGNOSIS — N1330 Unspecified hydronephrosis: Secondary | ICD-10-CM

## 2018-05-04 DIAGNOSIS — A419 Sepsis, unspecified organism: Secondary | ICD-10-CM | POA: Diagnosis not present

## 2018-05-04 DIAGNOSIS — A4151 Sepsis due to Escherichia coli [E. coli]: Secondary | ICD-10-CM | POA: Diagnosis present

## 2018-05-04 DIAGNOSIS — Q6211 Congenital occlusion of ureteropelvic junction: Secondary | ICD-10-CM | POA: Diagnosis not present

## 2018-05-04 DIAGNOSIS — Z9889 Other specified postprocedural states: Secondary | ICD-10-CM | POA: Diagnosis not present

## 2018-05-04 DIAGNOSIS — Z953 Presence of xenogenic heart valve: Secondary | ICD-10-CM | POA: Diagnosis not present

## 2018-05-04 DIAGNOSIS — E861 Hypovolemia: Secondary | ICD-10-CM | POA: Diagnosis present

## 2018-05-04 DIAGNOSIS — S0990XA Unspecified injury of head, initial encounter: Secondary | ICD-10-CM | POA: Diagnosis not present

## 2018-05-04 DIAGNOSIS — D649 Anemia, unspecified: Secondary | ICD-10-CM | POA: Diagnosis present

## 2018-05-04 DIAGNOSIS — I959 Hypotension, unspecified: Secondary | ICD-10-CM | POA: Diagnosis not present

## 2018-05-04 DIAGNOSIS — N289 Disorder of kidney and ureter, unspecified: Secondary | ICD-10-CM | POA: Diagnosis not present

## 2018-05-04 DIAGNOSIS — I259 Chronic ischemic heart disease, unspecified: Secondary | ICD-10-CM | POA: Insufficient documentation

## 2018-05-04 DIAGNOSIS — N179 Acute kidney failure, unspecified: Secondary | ICD-10-CM | POA: Diagnosis present

## 2018-05-04 DIAGNOSIS — R4182 Altered mental status, unspecified: Secondary | ICD-10-CM | POA: Diagnosis not present

## 2018-05-04 DIAGNOSIS — Z90710 Acquired absence of both cervix and uterus: Secondary | ICD-10-CM | POA: Diagnosis not present

## 2018-05-04 DIAGNOSIS — R109 Unspecified abdominal pain: Secondary | ICD-10-CM | POA: Diagnosis not present

## 2018-05-04 DIAGNOSIS — N135 Crossing vessel and stricture of ureter without hydronephrosis: Secondary | ICD-10-CM | POA: Diagnosis not present

## 2018-05-04 DIAGNOSIS — E872 Acidosis: Secondary | ICD-10-CM | POA: Diagnosis present

## 2018-05-04 DIAGNOSIS — I25118 Atherosclerotic heart disease of native coronary artery with other forms of angina pectoris: Secondary | ICD-10-CM | POA: Diagnosis not present

## 2018-05-04 DIAGNOSIS — S199XXA Unspecified injury of neck, initial encounter: Secondary | ICD-10-CM | POA: Diagnosis not present

## 2018-05-04 DIAGNOSIS — K8020 Calculus of gallbladder without cholecystitis without obstruction: Secondary | ICD-10-CM | POA: Diagnosis present

## 2018-05-04 DIAGNOSIS — E785 Hyperlipidemia, unspecified: Secondary | ICD-10-CM | POA: Diagnosis present

## 2018-05-04 DIAGNOSIS — R319 Hematuria, unspecified: Secondary | ICD-10-CM | POA: Diagnosis not present

## 2018-05-04 DIAGNOSIS — R6520 Severe sepsis without septic shock: Secondary | ICD-10-CM | POA: Diagnosis not present

## 2018-05-04 DIAGNOSIS — Z87891 Personal history of nicotine dependence: Secondary | ICD-10-CM | POA: Insufficient documentation

## 2018-05-04 DIAGNOSIS — N170 Acute kidney failure with tubular necrosis: Secondary | ICD-10-CM | POA: Diagnosis present

## 2018-05-04 DIAGNOSIS — N136 Pyonephrosis: Secondary | ICD-10-CM | POA: Diagnosis present

## 2018-05-04 DIAGNOSIS — R402441 Other coma, without documented Glasgow coma scale score, or with partial score reported, in the field [EMT or ambulance]: Secondary | ICD-10-CM | POA: Diagnosis not present

## 2018-05-04 DIAGNOSIS — Z952 Presence of prosthetic heart valve: Secondary | ICD-10-CM | POA: Diagnosis not present

## 2018-05-04 DIAGNOSIS — E039 Hypothyroidism, unspecified: Secondary | ICD-10-CM | POA: Diagnosis present

## 2018-05-04 DIAGNOSIS — E8770 Fluid overload, unspecified: Secondary | ICD-10-CM | POA: Diagnosis not present

## 2018-05-04 DIAGNOSIS — Z951 Presence of aortocoronary bypass graft: Secondary | ICD-10-CM | POA: Diagnosis not present

## 2018-05-04 DIAGNOSIS — R6521 Severe sepsis with septic shock: Secondary | ICD-10-CM | POA: Diagnosis present

## 2018-05-04 DIAGNOSIS — R55 Syncope and collapse: Secondary | ICD-10-CM | POA: Diagnosis not present

## 2018-05-04 DIAGNOSIS — Z8679 Personal history of other diseases of the circulatory system: Secondary | ICD-10-CM | POA: Diagnosis not present

## 2018-05-04 DIAGNOSIS — I10 Essential (primary) hypertension: Secondary | ICD-10-CM

## 2018-05-04 DIAGNOSIS — W1830XA Fall on same level, unspecified, initial encounter: Secondary | ICD-10-CM | POA: Diagnosis present

## 2018-05-04 DIAGNOSIS — R079 Chest pain, unspecified: Secondary | ICD-10-CM | POA: Diagnosis not present

## 2018-05-04 DIAGNOSIS — N390 Urinary tract infection, site not specified: Secondary | ICD-10-CM | POA: Diagnosis not present

## 2018-05-04 DIAGNOSIS — J9811 Atelectasis: Secondary | ICD-10-CM | POA: Diagnosis present

## 2018-05-04 DIAGNOSIS — I351 Nonrheumatic aortic (valve) insufficiency: Secondary | ICD-10-CM | POA: Diagnosis present

## 2018-05-04 DIAGNOSIS — R5381 Other malaise: Secondary | ICD-10-CM | POA: Diagnosis present

## 2018-05-04 DIAGNOSIS — I779 Disorder of arteries and arterioles, unspecified: Secondary | ICD-10-CM | POA: Diagnosis not present

## 2018-05-04 DIAGNOSIS — I4510 Unspecified right bundle-branch block: Secondary | ICD-10-CM | POA: Diagnosis present

## 2018-05-04 DIAGNOSIS — R0989 Other specified symptoms and signs involving the circulatory and respiratory systems: Secondary | ICD-10-CM | POA: Diagnosis not present

## 2018-05-04 DIAGNOSIS — J989 Respiratory disorder, unspecified: Secondary | ICD-10-CM | POA: Diagnosis not present

## 2018-05-04 DIAGNOSIS — N132 Hydronephrosis with renal and ureteral calculous obstruction: Secondary | ICD-10-CM | POA: Diagnosis not present

## 2018-05-04 DIAGNOSIS — N130 Hydronephrosis with ureteropelvic junction obstruction: Secondary | ICD-10-CM | POA: Diagnosis not present

## 2018-05-04 DIAGNOSIS — K5730 Diverticulosis of large intestine without perforation or abscess without bleeding: Secondary | ICD-10-CM | POA: Diagnosis present

## 2018-05-04 DIAGNOSIS — I2510 Atherosclerotic heart disease of native coronary artery without angina pectoris: Secondary | ICD-10-CM | POA: Diagnosis present

## 2018-05-04 NOTE — ED Triage Notes (Signed)
Pt reports having left flank pain that started around 2pm today. Pt states it radiates from back around to abdomen. Pt reports recently completing ABX for UTI.

## 2018-05-05 ENCOUNTER — Emergency Department (HOSPITAL_COMMUNITY)
Admission: EM | Admit: 2018-05-05 | Discharge: 2018-05-05 | Disposition: A | Discharge status: Home / Self Care | Payer: PPO | Source: Home / Self Care | Attending: Emergency Medicine | Admitting: Emergency Medicine

## 2018-05-05 ENCOUNTER — Emergency Department (HOSPITAL_COMMUNITY): Payer: PPO

## 2018-05-05 DIAGNOSIS — N1330 Unspecified hydronephrosis: Secondary | ICD-10-CM

## 2018-05-05 DIAGNOSIS — N289 Disorder of kidney and ureter, unspecified: Secondary | ICD-10-CM

## 2018-05-05 DIAGNOSIS — R109 Unspecified abdominal pain: Secondary | ICD-10-CM

## 2018-05-05 LAB — COMPREHENSIVE METABOLIC PANEL
ALBUMIN: 3.6 g/dL (ref 3.5–5.0)
ALT: 17 U/L (ref 14–54)
ANION GAP: 8 (ref 5–15)
AST: 19 U/L (ref 15–41)
Alkaline Phosphatase: 58 U/L (ref 38–126)
BUN: 22 mg/dL — ABNORMAL HIGH (ref 6–20)
CALCIUM: 9.5 mg/dL (ref 8.9–10.3)
CHLORIDE: 110 mmol/L (ref 101–111)
CO2: 23 mmol/L (ref 22–32)
Creatinine, Ser: 1.59 mg/dL — ABNORMAL HIGH (ref 0.44–1.00)
GFR calc non Af Amer: 31 mL/min — ABNORMAL LOW (ref >60)
GFR, EST AFRICAN AMERICAN: 36 mL/min — AB (ref >60)
GLUCOSE: 135 mg/dL — AB (ref 65–99)
POTASSIUM: 4.5 mmol/L (ref 3.5–5.1)
SODIUM: 141 mmol/L (ref 135–145)
Total Bilirubin: 0.5 mg/dL (ref 0.3–1.2)
Total Protein: 7.2 g/dL (ref 6.5–8.1)

## 2018-05-05 LAB — CBC WITH DIFFERENTIAL/PLATELET
BASOS ABS: 0.1 10*3/uL (ref 0.0–0.1)
BASOS PCT: 1 %
Eosinophils Absolute: 0.1 10*3/uL (ref 0.0–0.7)
Eosinophils Relative: 1 %
HEMATOCRIT: 36.2 % (ref 36.0–46.0)
HEMOGLOBIN: 11.5 g/dL — AB (ref 12.0–15.0)
LYMPHS PCT: 13 %
Lymphs Abs: 1.4 10*3/uL (ref 0.7–4.0)
MCH: 28 pg (ref 26.0–34.0)
MCHC: 31.8 g/dL (ref 30.0–36.0)
MCV: 88.3 fL (ref 78.0–100.0)
MONO ABS: 1.1 10*3/uL — AB (ref 0.1–1.0)
MONOS PCT: 10 %
NEUTROS PCT: 75 %
Neutro Abs: 8.1 10*3/uL — ABNORMAL HIGH (ref 1.7–7.7)
Platelets: 230 10*3/uL (ref 150–400)
RBC: 4.1 MIL/uL (ref 3.87–5.11)
RDW: 14.9 % (ref 11.5–15.5)
WBC: 10.7 10*3/uL — AB (ref 4.0–10.5)

## 2018-05-05 LAB — URINALYSIS, ROUTINE W REFLEX MICROSCOPIC
BILIRUBIN URINE: NEGATIVE
Glucose, UA: NEGATIVE mg/dL
KETONES UR: NEGATIVE mg/dL
NITRITE: NEGATIVE
Protein, ur: NEGATIVE mg/dL
Specific Gravity, Urine: 1.016 (ref 1.005–1.030)
WBC, UA: >50 WBC/hpf — ABNORMAL HIGH (ref 0–5)
pH: 5 (ref 5.0–8.0)

## 2018-05-05 LAB — LIPASE, BLOOD: Lipase: 31 U/L (ref 11–51)

## 2018-05-05 MED ORDER — MORPHINE SULFATE (PF) 4 MG/ML IV SOLN
4.0000 mg | Freq: Once | INTRAVENOUS | Status: AC
  Administered 2018-05-05: 4 mg via INTRAVENOUS
  Filled 2018-05-05: qty 1

## 2018-05-05 MED ORDER — HYDROCODONE-ACETAMINOPHEN 5-325 MG PO TABS: 1.0000 | ORAL_TABLET | ORAL | 0 refills | Status: DC | PRN

## 2018-05-05 MED ORDER — MORPHINE SULFATE (PF) 4 MG/ML IV SOLN
4.0000 mg | Freq: Once | INTRAVENOUS | Status: AC
Start: 2018-05-05 — End: 2018-05-05
  Administered 2018-05-05: 4 mg via INTRAVENOUS
  Filled 2018-05-05: qty 1

## 2018-05-05 MED ORDER — SODIUM CHLORIDE 0.9 % IV BOLUS
1000.0000 mL | Freq: Once | INTRAVENOUS | Status: AC
  Administered 2018-05-05: 1000 mL via INTRAVENOUS

## 2018-05-05 MED ORDER — ONDANSETRON HCL 4 MG/2ML IJ SOLN
4.0000 mg | Freq: Once | INTRAMUSCULAR | Status: AC
  Administered 2018-05-05: 4 mg via INTRAVENOUS
  Filled 2018-05-05: qty 2

## 2018-05-05 NOTE — ED Notes (Signed)
Patient transported to CT 

## 2018-05-05 NOTE — Progress Notes (Signed)
Cardiology Office Note   Date:  05/06/2018   ID:  Miranda Hutchinson, DOB 1946-01-07, MRN [id number]  PCP:  Antony Contras, MD  Cardiologist:   No primary care provider on file. Referring:  Antony Contras, MD  Chief Complaint  Patient presents with  . Flank Pain      History of Present Illness: Miranda Hutchinson is a 72 y.o. female who presents for follow up of CAD.   She has a h/o of severe 3 vessel ASCAD s/p CABG (LIMA to LAD, SVG to OM, SVG to PDA), ascending aortic root aneursym with AI s/p Bentall procedure 07/2012 and dyslipidemia. She also has bilateral carotid artery disease. Echocardiogram 09/2017 showed normal LVEF, 55-60%.  She was in the ED yesterday with UTI.  She had a mild increase in creat.  She was found to have some hydronephrosis.  This is her biggest complaint this morning is the flank pain that brought her to the emergency room.  There was some confusion but we managed to find out from the urology office that she has a urology appointment follow-up today.  She was given pain medicines last night.  She is somnolent today.  She denies any ongoing chest discomfort today.  I reviewed the records from February when she was last seen in our office and she was describing some chest discomfort.  She was supposed to have a perfusion study but she did not want to have this done for reasons that are not clear to me.  She was seeing another cardiologist and is switching to me.  I have some records from her primary care and she was noted to have some chest discomfort in April and was set up with this appointment.  However, today she is not having this discomfort and she cannot quantify or qualify the discomfort that she was having.  She cannot tell me whether it is sharp or dull and she cannot tell me intensity or associated symptoms.  It is not clear to me how active she is at home but it seems like the discomfort comes and goes sporadically with rest.  It is not clear that she has had this  before.  She is had no new shortness of breath, PND or orthopnea.  She is had no new palpitations, presyncope or syncope.       Past Medical History:  Diagnosis Date  . Aortic insufficiency    a. ascending aortic root aneursym with AI s/p Bentall procedure with bioprosthetic aortic valve 07/2012.  . Ascending aortic aneurysm ([place])    a. s/p Bentall 2013.  . Aspirin intolerance   . Bilateral carotid artery disease ([place])    a.  carotid duplex 2013 showed 40-59% distal RICA and 78-46% mLICA stenosis. b. Duplex 09/2017 - <40% BICA.  Marland Kitchen Coronary artery disease    a. 3 vessel ASCAD s/p CABG (LIMA to LAD, SVG to OM, SVG to PDA) at time of Bentall 07/2012.  Marland Kitchen Dyslipidemia   . Heart murmur   . Hyperlipidemia   . Hyperthyroidism   . Hypothyroidism   . RBBB   . Renal insufficiency    a. Cr 1.31 in 2016, previously 1 range.    Past Surgical History:  Procedure Laterality Date  . ABDOMINAL HYSTERECTOMY  1990's  . AORTIC VALVE REPLACEMENT  08/10/2012   Procedure: AORTIC VALVE REPLACEMENT (AVR);  Surgeon: Grace Isaac, MD;  Location: [place];  Service: Open Heart Surgery;  Laterality: N/A;  . BREAST EXCISIONAL BIOPSY  Left   . CARDIAC CATHETERIZATION  08/04/2012  . CORONARY ARTERY BYPASS GRAFT  08/10/2012   Procedure: CORONARY ARTERY BYPASS GRAFTING (CABG);  Surgeon: Grace Isaac, MD;  Location: [place];  Service: Open Heart Surgery;  Laterality: N/A;  . THORACIC AORTIC ANEURYSM REPAIR  08/10/2012   Procedure: THORACIC ASCENDING ANEURYSM REPAIR (AAA);  Surgeon: Grace Isaac, MD;  Location: [place];  Service: Open Heart Surgery;  Laterality: N/A;  . TONSILLECTOMY     "I was a little child"     Current Outpatient Medications  Medication Sig Dispense Refill  . cycloSPORINE (RESTASIS) 0.05 % ophthalmic emulsion Place 1 drop into both eyes 2 (two) times daily.     Marland Kitchen losartan (COZAAR) 100 MG tablet Take 50 mg by mouth daily.    . metoprolol tartrate (LOPRESSOR) 25 MG tablet Take 25 mg by  mouth 2 (two) times daily.    . Multiple Vitamins-Minerals (ALIVE WOMENS 50+ PO) Take 1 tablet by mouth daily.    . rosuvastatin (CRESTOR) 40 MG tablet TAKE 1 TABLET(40 MG) BY MOUTH DAILY 90 tablet 3   No current facility-administered medications for this visit.     Allergies:   Percocet [oxycodone-acetaminophen] and Asa [aspirin]    ROS:  Please see the history of present illness.   Otherwise, review of systems are positive for none.   All other systems are reviewed and negative.    PHYSICAL EXAM: VS:  BP (!) 96/54   Pulse (!) 116   Ht 5\' 6"  (1.676 m)   Wt 214 lb (97.1 kg)   BMI 34.54 kg/m  , BMI Body mass index is 34.54 kg/m. GEN:  No distress NECK:  No jugular venous distention at 90 degrees, waveform within normal limits, carotid upstroke brisk and symmetric, no bruits, no thyromegaly LYMPHATICS:  No cervical adenopathy LUNGS:  Clear to auscultation bilaterally BACK:  No CVA tenderness CHEST:  Well healed sternotomy scar. HEART:  S1 and S2 within normal limits, no S3, no S4, no clicks, no rubs, 2 out of 6 apical systolic murmur radiating out the aortic outflow tract, no diastolic murmurs ABD:  Positive bowel sounds normal in frequency in pitch, no bruits, no rebound, no guarding, unable to assess midline mass or bruit with the patient seated. EXT:  2 plus pulses throughout, no edema, no cyanosis no clubbing SKIN:  No rashes no nodules NEURO:  Cranial nerves II through XII grossly intact, motor grossly intact throughout PSYCH:  Cognitively intact, oriented to person place and time     EKG:  EKG is ordered today. The ekg ordered today demonstrates sinus tachycardia, rate 116, right bundle branch block, no acute ST-T wave changes.   Recent Labs: 01/28/2018: NT-Pro BNP 67; TSH 0.421 05/05/2018: ALT 17; BUN 22; Creatinine, Ser 1.59; Hemoglobin 11.5; Platelets 230; Potassium 4.5; Sodium 141    Lipid Panel    Component Value Date/Time   CHOL 176 10/06/2017 1400   TRIG 166  (H) 10/06/2017 1400   HDL 40 10/06/2017 1400   CHOLHDL 4.4 10/06/2017 1400   CHOLHDL 4.6 10/16/2015 0958   VLDL 18 10/16/2015 0958   LDLCALC 103 (H) 10/06/2017 1400      Wt Readings from Last 3 Encounters:  05/06/18 214 lb (97.1 kg)  05/04/18 204 lb (92.5 kg)  04/14/18 204 lb (92.5 kg)      Other studies Reviewed: Additional studies/ records that were reviewed today include: ED records.  Primary care records. Review of the above records demonstrates:  Please see elsewhere in the note.     ASSESSMENT AND PLAN:  Exertional Fatigue:     She was to have a stress test.  I think this can be done electively to evaluate this and her vague chest discomfort.  However, she first needs to be followed up for her flank discomfort and hydronephrosis.  I discussed this with her grandson who accompanies her and they are going to get to the urology appointment which I think is the primary issue and I would like to see her back afterwards as below.  CAD:    She has some vague chest discomfort but no objective evidence of ischemia.  This will be evaluated in the weeks to come with [facility].  She seems to have a stable atypical chest pain pattern at this point.  H/o ascending aortic root aneursym with AI:   She is status post Bentall procedure 07/2012. Recent echo 09/2017 stable with no AI.   No change in therapy.   HTN:    Her blood pressure is running low.  Her creatinine was mildly elevated.  And then reduce her Cozaar to 50 mg daily.  DLD:   LDL was not at target.  I will refer to [facility] after the next appt.   Bilateral Carotid Artery Disease:    She has less than 40% stenosis in October.  No further imaging at this point.      Current medicines are reviewed at length with the patient today.  The patient does not have concerns regarding medicines.  The following changes have been made:  no change  Labs/ tests ordered today include: None  Orders Placed This Encounter    Procedures  . EKG 12-Lead     Disposition:   FU with me in six weeks.     Signed, Minus Breeding, MD  05/06/2018 12:58 PM    [facility]

## 2018-05-05 NOTE — Discharge Instructions (Signed)
Your CT scan shows a blockage in the ureter on the left side.  The cause for this is not clear.  Please take the pain medication as needed.  Call the urologist for an appointment as soon as possible.  Return to the emergency department if pain is not being adequately controlled, you start vomiting, or start running a fever.

## 2018-05-05 NOTE — ED Provider Notes (Signed)
[facility] DEPT Provider Note   CSN: [id number] Arrival date & time: 05/04/18  2237     History   Chief Complaint Chief Complaint  Patient presents with  . Flank Pain    HPI Miranda Hutchinson is a 72 y.o. female.  The history is provided by the patient.  She has history of ascending aortic aneurysm, aortic insufficiency, coronary artery disease, carotid artery disease, hyperlipidemia, hyperthyroidism and comes in complaining of left lower quadrant pain which started this afternoon.  There is some radiation of pain to the back.  Pain is severe and she rates it at 10/10.  Nothing makes it better, nothing makes it worse.  She felt cold, but no overt chills or sweats.  She denies fever.  She denies nausea or vomiting.  She denies any urinary difficulty.  Denies constipation or diarrhea.  She was recently treated for urinary tract infection and did complete 2-week course of cephalexin.  Past Medical History:  Diagnosis Date  . Aortic insufficiency    a. ascending aortic root aneursym with AI s/p Bentall procedure with bioprosthetic aortic valve 07/2012.  . Ascending aortic aneurysm ([place])    a. s/p Bentall 2013.  . Aspirin intolerance   . Bilateral carotid artery disease ([place])    a.  carotid duplex 2013 showed 40-59% distal RICA and 93-81% mLICA stenosis. b. Duplex 09/2017 - <40% BICA.  Marland Kitchen Coronary artery disease    a. 3 vessel ASCAD s/p CABG (LIMA to LAD, SVG to OM, SVG to PDA) at time of Bentall 07/2012.  Marland Kitchen Dyslipidemia   . Heart murmur   . Hyperlipidemia   . Hyperthyroidism   . Hypothyroidism   . RBBB   . Renal insufficiency    a. Cr 1.31 in 2016, previously 1 range.    Patient Active Problem List   Diagnosis Date Noted  . Multinodular goiter 09/12/2015  . Hyperthyroidism 09/12/2015  . Dizziness 07/03/2014  . Blurred vision 07/03/2014  . Headache(784.0) 07/03/2014  . S/P CABG x 3 08/16/2012  . S/P ascending aortic replacement 08/16/2012  .  S/P AVR (aortic valve replacement) 08/16/2012  . Essential hypertension, benign 08/05/2012  . Pure hypercholesterolemia 08/05/2012  . Obesity, unspecified 08/05/2012  . Coronary artery disease 08/04/2012  . Aortic valve insufficiency 08/04/2012  . Dilated aortic root ([place]) 08/04/2012    Past Surgical History:  Procedure Laterality Date  . ABDOMINAL HYSTERECTOMY  1990's  . AORTIC VALVE REPLACEMENT  08/10/2012   Procedure: AORTIC VALVE REPLACEMENT (AVR);  Surgeon: Grace Isaac, MD;  Location: [facility];  Service: Open Heart Surgery;  Laterality: N/A;  . BREAST EXCISIONAL BIOPSY Left   . CARDIAC CATHETERIZATION  08/04/2012  . CORONARY ARTERY BYPASS GRAFT  08/10/2012   Procedure: CORONARY ARTERY BYPASS GRAFTING (CABG);  Surgeon: Grace Isaac, MD;  Location: [facility];  Service: Open Heart Surgery;  Laterality: N/A;  . THORACIC AORTIC ANEURYSM REPAIR  08/10/2012   Procedure: THORACIC ASCENDING ANEURYSM REPAIR (AAA);  Surgeon: Grace Isaac, MD;  Location: [facility];  Service: Open Heart Surgery;  Laterality: N/A;  . TONSILLECTOMY     "I was a little child"     OB History   None      Home Medications    Prior to Admission medications   Medication Sig Start Date End Date Taking? Authorizing Provider  cycloSPORINE (RESTASIS) 0.05 % ophthalmic emulsion Place 1 drop into both eyes 2 (two) times daily.    Yes [provider]  losartan (COZAAR) 100 MG tablet Take 1 tablet (100 mg total) by mouth daily. 09/29/17 09/24/18 Yes Turner, Eber Hong, MD  metoprolol tartrate (LOPRESSOR) 25 MG tablet Take 25 mg by mouth 2 (two) times daily.   Yes [provider]  Multiple Vitamins-Minerals (ALIVE WOMENS 50+ PO) Take 1 tablet by mouth daily.   Yes [provider]  rosuvastatin (CRESTOR) 40 MG tablet TAKE 1 TABLET(40 MG) BY MOUTH DAILY 10/19/17  Yes Dunn, Dayna N, PA-C  oxyCODONE-acetaminophen (PERCOCET/ROXICET) 5-325 MG tablet Take 1 tablet by mouth every 6 (six) hours as needed  for severe pain. Patient not taking: Reported on 05/05/2018 04/14/18   Virgel Manifold, MD    Family History Family History  Problem Relation Age of Onset  . Lung cancer Mother   . Thyroid disease Mother     Social History Social History   Tobacco Use  . Smoking status: Former Smoker    Packs/day: 0.05    Years: 2.00    Pack years: 0.10    Types: Cigarettes    Last attempt to quit: 12/30/1983    Years since quitting: 34.3  . Smokeless tobacco: Never Used  Substance Use Topics  . Alcohol use: No  . Drug use: No     Allergies   Percocet [oxycodone-acetaminophen] and Asa [aspirin]   Review of Systems Review of Systems  All other systems reviewed and are negative.    Physical Exam Updated Vital Signs BP (!) 155/85 (BP Location: Left Arm)   Pulse 90   Temp 98.5 F (36.9 C) (Oral)   Resp 18   Ht 5\' 6"  (1.676 m)   Wt 92.5 kg (204 lb)   SpO2 94%   BMI 32.93 kg/m   Physical Exam  Nursing note and vitals reviewed.  72 year old female, appears uncomfortable, but is in no acute distress. Vital signs are significant for elevated systolic blood pressure. Oxygen saturation is 94%, which is normal. Head is normocephalic and atraumatic. PERRLA, EOMI. Oropharynx is clear. Neck is nontender and supple without adenopathy or JVD. Back is nontender and there is no CVA tenderness. Lungs are clear without rales, wheezes, or rhonchi. Chest is nontender. Heart has regular rate and rhythm without murmur. Abdomen is soft, flat, with moderate left lower quadrant tenderness.  There is no rebound or guarding.  There are no masses or hepatosplenomegaly and peristalsis is hypoactive. Extremities have no cyanosis or edema, full range of motion is present. Skin is warm and dry without rash. Neurologic: Mental status is normal, cranial nerves are intact, there are no motor or sensory deficits.  ED Treatments / Results  Labs (all labs ordered are listed, but only abnormal results are  displayed) Labs Reviewed  URINALYSIS, ROUTINE W REFLEX MICROSCOPIC - Abnormal; Notable for the following components:      Result Value   APPearance HAZY (*)    Hgb urine dipstick SMALL (*)    Leukocytes, UA LARGE (*)    WBC, UA >50 (*)    Bacteria, UA FEW (*)    All other components within normal limits  COMPREHENSIVE METABOLIC PANEL - Abnormal; Notable for the following components:   Glucose, Bld 135 (*)    BUN 22 (*)    Creatinine, Ser 1.59 (*)    GFR calc non Af Amer 31 (*)    GFR calc Af Amer 36 (*)    All other components within normal limits  CBC WITH DIFFERENTIAL/PLATELET - Abnormal; Notable for the following components:   WBC  10.7 (*)    Hemoglobin 11.5 (*)    Neutro Abs 8.1 (*)    Monocytes Absolute 1.1 (*)    All other components within normal limits  LIPASE, BLOOD    Radiology Ct Renal Stone Study  Result Date: 05/05/2018 CLINICAL DATA:  72 year old female with left flank pain. Concern for stone. EXAM: CT ABDOMEN AND PELVIS WITHOUT CONTRAST TECHNIQUE: Multidetector CT imaging of the abdomen and pelvis was performed following the standard protocol without IV contrast. COMPARISON:  None. FINDINGS: Evaluation of this exam is limited in the absence of intravenous contrast. Lower chest: Minimal bibasilar atelectatic changes. The visualized lung bases are otherwise clear. There is coronary vascular calcification and mechanical aortic valve. No intra-abdominal free air or free fluid. Hepatobiliary: The liver is unremarkable. There is layering stone within the gallbladder. No pericholecystic fluid. No intrahepatic biliary ductal dilatation. Pancreas: Unremarkable. No pancreatic ductal dilatation or surrounding inflammatory changes. Spleen: Normal in size without focal abnormality. Adrenals/Urinary Tract: The adrenal glands are unremarkable. There is a nonobstructing stone or 2 adjacent stones with combined diameter of 8 mm in the inferior pole of the left kidney. There is mild left  hydronephrosis with transition at the ureteropelvic junction. No obstructing stone identified. A UPJ stricture or urothelial lesion is not entirely excluded. CT urogram may provide better evaluation. There is mild fullness of the right renal collecting system. No stone identified. A 3 mm calcific focus along the course of the distal right ureter (series 2, image 82) may represent a focus of vascular calcification. The urinary bladder is grossly unremarkable for the degree of distention. Stomach/Bowel: There is a small hiatal hernia. There is extensive sigmoid diverticulosis with muscular hypertrophy. No active inflammatory changes. The appendix is normal. Vascular/Lymphatic: Moderate aortoiliac atherosclerotic disease. The aorta is ectatic measuring up to 2.4 cm. There is 2.1 cm aneurysmal dilatation of the right common iliac artery. No portal venous gas. There is no adenopathy. Reproductive: Hysterectomy.  No pelvic mass. Other: None Musculoskeletal: Degenerative changes of the spine. No acute osseous pathology. IMPRESSION: 1. Mild left hydronephrosis likely secondary to stricture at the left UPJ. No obstructing stone identified. There is an 8 mm nonobstructing left renal inferior pole calculus. CT urography or direct visualization may provide better evaluation of the nature of obstruction at the level of the UPJ and exclude a urothelial lesion. There is also a mild right hydronephrosis. 2. Extensive sigmoid and colonic diverticulosis. No active inflammatory changes. No bowel obstruction. Normal appendix. 3. Cholelithiasis. Electronically Signed   By: Anner Crete M.D.   On: 05/05/2018 03:38    Procedures Procedures   Medications Ordered in ED Medications  morphine 4 MG/ML injection 4 mg (4 mg Intravenous Given 05/05/18 0200)  ondansetron (ZOFRAN) injection 4 mg (4 mg Intravenous Given 05/05/18 0200)  sodium chloride 0.9 % bolus 1,000 mL (1,000 mLs Intravenous New Bag/Given 05/05/18 0200)  morphine 4 MG/ML  injection 4 mg (4 mg Intravenous Given 05/05/18 0339)  morphine 4 MG/ML injection 4 mg (4 mg Intravenous Given 05/05/18 0449)     Initial Impression / Assessment and Plan / ED Course  I have reviewed the triage vital signs and the nursing notes.  Pertinent labs & imaging results that were available during my care of the patient were reviewed by me and considered in my medical decision making (see chart for details).  Left lower quadrant pain with radiation to the left flank.  Possible urolithiasis, possible recurrent UTI.  Consider diverticulitis.  She has known history  of aneurysm, so abdominal aneurysm is also possible.  Old records are reviewed, confirming ED visit on April 17 for UTI.  Culture grew E. coli which was sensitive to cefazolin and should have been adequately treated with a course of cephalexin.  I see no prior abdominal imaging.  She will be sent for renal stone protocol CT scan.  Screening labs obtained.  To be given IV fluids, morphine, ondansetron.  Urine showed WBCs but no bacteria and negative nitrite.  Creatinine is mildly elevated at 1.59 which is down from what it had been on April 17, but above baseline in January.  CT shows left hydronephrosis.  There is a large left renal calculus, but no ureteral calculus.  I have viewed these images personally.  Patient did require additional doses of morphine to get pain control.  She stated in spite of 3 doses of morphine that pain was still present, but it was down to 4/5.  I have discussed the case with Dr. Gloriann Loan, on-call for urology explaining CT findings, urinalysis, metabolic panel findings.  He suggests referral to the office for outpatient work-up and narcotic analgesics as needed for pain.  She is allergic to oxycodone, so she is discharged with prescription for hydrocodone-acetaminophen.  Advised to return for pain not being adequately controlled, vomiting, or fever.  Patient expresses understanding.  Final Clinical Impressions(s) /  ED Diagnoses   Final diagnoses:  Left flank pain  Hydronephrosis, unspecified hydronephrosis type  Renal insufficiency    ED Discharge Orders        Ordered    HYDROcodone-acetaminophen (NORCO) 5-325 MG tablet  Every 4 hours PRN     93/26/71 [id number]       Delora Fuel, MD 09/98/33 [phone]

## 2018-05-06 ENCOUNTER — Encounter: Payer: Self-pay | Admitting: Cardiology

## 2018-05-06 ENCOUNTER — Ambulatory Visit (INDEPENDENT_AMBULATORY_CARE_PROVIDER_SITE_OTHER): Payer: PPO | Admitting: Cardiology

## 2018-05-06 VITALS — BP 96/54 | HR 116 | Ht 66.0 in | Wt 214.0 lb

## 2018-05-06 DIAGNOSIS — I25118 Atherosclerotic heart disease of native coronary artery with other forms of angina pectoris: Secondary | ICD-10-CM

## 2018-05-06 DIAGNOSIS — I779 Disorder of arteries and arterioles, unspecified: Secondary | ICD-10-CM

## 2018-05-06 DIAGNOSIS — I959 Hypotension, unspecified: Secondary | ICD-10-CM | POA: Diagnosis not present

## 2018-05-06 DIAGNOSIS — Q6211 Congenital occlusion of ureteropelvic junction: Secondary | ICD-10-CM | POA: Diagnosis not present

## 2018-05-06 DIAGNOSIS — I739 Peripheral vascular disease, unspecified: Secondary | ICD-10-CM

## 2018-05-06 DIAGNOSIS — Z9889 Other specified postprocedural states: Secondary | ICD-10-CM | POA: Diagnosis not present

## 2018-05-06 DIAGNOSIS — Z8679 Personal history of other diseases of the circulatory system: Secondary | ICD-10-CM | POA: Diagnosis not present

## 2018-05-06 DIAGNOSIS — Z953 Presence of xenogenic heart valve: Secondary | ICD-10-CM | POA: Diagnosis not present

## 2018-05-06 NOTE — Patient Instructions (Addendum)
Medication Instructions:  DECREASE- Losartan 50 mg daily(1/2 Tablets)  If you need a refill on your cardiac medications before your next appointment, please call your pharmacy.  Labwork: None Ordered   Testing/Procedures: None Ordered   Follow-Up: Your physician wants you to follow-up in: 6 Weeks.     Thank you for choosing [facility]!!

## 2018-05-07 ENCOUNTER — Other Ambulatory Visit (HOSPITAL_COMMUNITY): Payer: PPO

## 2018-05-07 ENCOUNTER — Inpatient Hospital Stay (HOSPITAL_COMMUNITY)
Admission: EM | Admit: 2018-05-07 | Discharge: 2018-05-13 | DRG: 871 | Disposition: A | Discharge status: Home Health | Payer: PPO | Attending: Internal Medicine | Admitting: Internal Medicine

## 2018-05-07 ENCOUNTER — Inpatient Hospital Stay (HOSPITAL_COMMUNITY): Payer: PPO

## 2018-05-07 ENCOUNTER — Emergency Department (HOSPITAL_COMMUNITY): Payer: PPO

## 2018-05-07 ENCOUNTER — Encounter (HOSPITAL_COMMUNITY): Payer: Self-pay | Admitting: Family Medicine

## 2018-05-07 DIAGNOSIS — K8020 Calculus of gallbladder without cholecystitis without obstruction: Secondary | ICD-10-CM | POA: Diagnosis present

## 2018-05-07 DIAGNOSIS — E872 Acidosis: Secondary | ICD-10-CM | POA: Diagnosis present

## 2018-05-07 DIAGNOSIS — I2510 Atherosclerotic heart disease of native coronary artery without angina pectoris: Secondary | ICD-10-CM | POA: Diagnosis present

## 2018-05-07 DIAGNOSIS — A4151 Sepsis due to Escherichia coli [E. coli]: Principal | ICD-10-CM | POA: Diagnosis present

## 2018-05-07 DIAGNOSIS — R6521 Severe sepsis with septic shock: Secondary | ICD-10-CM | POA: Diagnosis present

## 2018-05-07 DIAGNOSIS — N132 Hydronephrosis with renal and ureteral calculous obstruction: Secondary | ICD-10-CM | POA: Diagnosis not present

## 2018-05-07 DIAGNOSIS — E861 Hypovolemia: Secondary | ICD-10-CM | POA: Diagnosis present

## 2018-05-07 DIAGNOSIS — Z952 Presence of prosthetic heart valve: Secondary | ICD-10-CM | POA: Diagnosis not present

## 2018-05-07 DIAGNOSIS — K5730 Diverticulosis of large intestine without perforation or abscess without bleeding: Secondary | ICD-10-CM | POA: Diagnosis present

## 2018-05-07 DIAGNOSIS — I10 Essential (primary) hypertension: Secondary | ICD-10-CM | POA: Diagnosis present

## 2018-05-07 DIAGNOSIS — E876 Hypokalemia: Secondary | ICD-10-CM | POA: Diagnosis present

## 2018-05-07 DIAGNOSIS — E039 Hypothyroidism, unspecified: Secondary | ICD-10-CM | POA: Diagnosis present

## 2018-05-07 DIAGNOSIS — A419 Sepsis, unspecified organism: Secondary | ICD-10-CM | POA: Diagnosis present

## 2018-05-07 DIAGNOSIS — N390 Urinary tract infection, site not specified: Secondary | ICD-10-CM

## 2018-05-07 DIAGNOSIS — I351 Nonrheumatic aortic (valve) insufficiency: Secondary | ICD-10-CM | POA: Diagnosis present

## 2018-05-07 DIAGNOSIS — W1830XA Fall on same level, unspecified, initial encounter: Secondary | ICD-10-CM | POA: Diagnosis present

## 2018-05-07 DIAGNOSIS — I4510 Unspecified right bundle-branch block: Secondary | ICD-10-CM | POA: Diagnosis present

## 2018-05-07 DIAGNOSIS — Z8349 Family history of other endocrine, nutritional and metabolic diseases: Secondary | ICD-10-CM

## 2018-05-07 DIAGNOSIS — I25118 Atherosclerotic heart disease of native coronary artery with other forms of angina pectoris: Secondary | ICD-10-CM

## 2018-05-07 DIAGNOSIS — N179 Acute kidney failure, unspecified: Secondary | ICD-10-CM | POA: Diagnosis present

## 2018-05-07 DIAGNOSIS — J9811 Atelectasis: Secondary | ICD-10-CM | POA: Diagnosis present

## 2018-05-07 DIAGNOSIS — Z885 Allergy status to narcotic agent status: Secondary | ICD-10-CM

## 2018-05-07 DIAGNOSIS — Z87891 Personal history of nicotine dependence: Secondary | ICD-10-CM

## 2018-05-07 DIAGNOSIS — R319 Hematuria, unspecified: Secondary | ICD-10-CM | POA: Diagnosis not present

## 2018-05-07 DIAGNOSIS — Z886 Allergy status to analgesic agent status: Secondary | ICD-10-CM

## 2018-05-07 DIAGNOSIS — D649 Anemia, unspecified: Secondary | ICD-10-CM | POA: Diagnosis present

## 2018-05-07 DIAGNOSIS — R4182 Altered mental status, unspecified: Secondary | ICD-10-CM

## 2018-05-07 DIAGNOSIS — R079 Chest pain, unspecified: Secondary | ICD-10-CM

## 2018-05-07 DIAGNOSIS — Z90710 Acquired absence of both cervix and uterus: Secondary | ICD-10-CM

## 2018-05-07 DIAGNOSIS — E8770 Fluid overload, unspecified: Secondary | ICD-10-CM | POA: Diagnosis not present

## 2018-05-07 DIAGNOSIS — N1330 Unspecified hydronephrosis: Secondary | ICD-10-CM

## 2018-05-07 DIAGNOSIS — R5381 Other malaise: Secondary | ICD-10-CM | POA: Diagnosis present

## 2018-05-07 DIAGNOSIS — E785 Hyperlipidemia, unspecified: Secondary | ICD-10-CM | POA: Diagnosis present

## 2018-05-07 DIAGNOSIS — R6520 Severe sepsis without septic shock: Secondary | ICD-10-CM | POA: Diagnosis present

## 2018-05-07 DIAGNOSIS — R0989 Other specified symptoms and signs involving the circulatory and respiratory systems: Secondary | ICD-10-CM

## 2018-05-07 DIAGNOSIS — N170 Acute kidney failure with tubular necrosis: Secondary | ICD-10-CM | POA: Diagnosis present

## 2018-05-07 DIAGNOSIS — Z801 Family history of malignant neoplasm of trachea, bronchus and lung: Secondary | ICD-10-CM

## 2018-05-07 DIAGNOSIS — Z951 Presence of aortocoronary bypass graft: Secondary | ICD-10-CM | POA: Diagnosis not present

## 2018-05-07 DIAGNOSIS — N136 Pyonephrosis: Secondary | ICD-10-CM | POA: Diagnosis present

## 2018-05-07 DIAGNOSIS — Z79899 Other long term (current) drug therapy: Secondary | ICD-10-CM | POA: Diagnosis not present

## 2018-05-07 DIAGNOSIS — R069 Unspecified abnormalities of breathing: Secondary | ICD-10-CM

## 2018-05-07 DIAGNOSIS — Q6211 Congenital occlusion of ureteropelvic junction: Secondary | ICD-10-CM | POA: Diagnosis not present

## 2018-05-07 DIAGNOSIS — J989 Respiratory disorder, unspecified: Secondary | ICD-10-CM | POA: Diagnosis not present

## 2018-05-07 LAB — CBC WITH DIFFERENTIAL/PLATELET
BASOS PCT: 0 %
Basophils Absolute: 0 10*3/uL (ref 0.0–0.1)
EOS ABS: 0 10*3/uL (ref 0.0–0.7)
Eosinophils Relative: 0 %
HCT: 34 % — ABNORMAL LOW (ref 36.0–46.0)
HEMOGLOBIN: 10.9 g/dL — AB (ref 12.0–15.0)
Lymphocytes Relative: 5 %
Lymphs Abs: 0.6 10*3/uL — ABNORMAL LOW (ref 0.7–4.0)
MCH: 27.9 pg (ref 26.0–34.0)
MCHC: 32.1 g/dL (ref 30.0–36.0)
MCV: 87.2 fL (ref 78.0–100.0)
Monocytes Absolute: 0.9 10*3/uL (ref 0.1–1.0)
Monocytes Relative: 8 %
NEUTROS PCT: 87 %
Neutro Abs: 9.7 10*3/uL — ABNORMAL HIGH (ref 1.7–7.7)
Platelets: 177 10*3/uL (ref 150–400)
RBC: 3.9 MIL/uL (ref 3.87–5.11)
RDW: 15.5 % (ref 11.5–15.5)
WBC: 11.1 10*3/uL — AB (ref 4.0–10.5)

## 2018-05-07 LAB — COMPREHENSIVE METABOLIC PANEL
ALK PHOS: 65 U/L (ref 38–126)
ALT: 13 U/L — AB (ref 14–54)
ANION GAP: 9 (ref 5–15)
AST: 22 U/L (ref 15–41)
Albumin: 2.8 g/dL — ABNORMAL LOW (ref 3.5–5.0)
BUN: 23 mg/dL — ABNORMAL HIGH (ref 6–20)
CALCIUM: 8.9 mg/dL (ref 8.9–10.3)
CHLORIDE: 104 mmol/L (ref 101–111)
CO2: 22 mmol/L (ref 22–32)
Creatinine, Ser: 2.58 mg/dL — ABNORMAL HIGH (ref 0.44–1.00)
GFR, EST AFRICAN AMERICAN: 20 mL/min — AB (ref >60)
GFR, EST NON AFRICAN AMERICAN: 17 mL/min — AB (ref >60)
Glucose, Bld: 150 mg/dL — ABNORMAL HIGH (ref 65–99)
Potassium: 4.3 mmol/L (ref 3.5–5.1)
Sodium: 135 mmol/L (ref 135–145)
Total Bilirubin: 1.3 mg/dL — ABNORMAL HIGH (ref 0.3–1.2)
Total Protein: 6.5 g/dL (ref 6.5–8.1)

## 2018-05-07 LAB — URINALYSIS, ROUTINE W REFLEX MICROSCOPIC
BILIRUBIN URINE: NEGATIVE
Glucose, UA: NEGATIVE mg/dL
Ketones, ur: 5 mg/dL — AB
NITRITE: POSITIVE — AB
Protein, ur: 100 mg/dL — AB
SPECIFIC GRAVITY, URINE: 1.019 (ref 1.005–1.030)
WBC, UA: >50 WBC/hpf — ABNORMAL HIGH (ref 0–5)
pH: 5 (ref 5.0–8.0)

## 2018-05-07 LAB — GLUCOSE, CAPILLARY: Glucose-Capillary: 100 mg/dL — ABNORMAL HIGH (ref 65–99)

## 2018-05-07 LAB — MRSA PCR SCREENING: MRSA by PCR: NEGATIVE

## 2018-05-07 LAB — SODIUM, URINE, RANDOM: SODIUM UR: 55 mmol/L

## 2018-05-07 LAB — TSH: TSH: 0.229 u[IU]/mL — ABNORMAL LOW (ref 0.350–4.500)

## 2018-05-07 LAB — PROTIME-INR
INR: 1.32
PROTHROMBIN TIME: 16.3 s — AB (ref 11.4–15.2)

## 2018-05-07 LAB — CK: CK TOTAL: 176 U/L (ref 38–234)

## 2018-05-07 LAB — APTT: aPTT: 36 seconds (ref 24–36)

## 2018-05-07 LAB — CREATININE, URINE, RANDOM: CREATININE, URINE: 246.28 mg/dL

## 2018-05-07 LAB — CBG MONITORING, ED: Glucose-Capillary: 146 mg/dL — ABNORMAL HIGH (ref 65–99)

## 2018-05-07 LAB — I-STAT CG4 LACTIC ACID, ED
LACTIC ACID, VENOUS: 0.98 mmol/L (ref 0.5–1.9)
Lactic Acid, Venous: 0.93 mmol/L (ref 0.5–1.9)

## 2018-05-07 MED ORDER — ROSUVASTATIN CALCIUM 40 MG PO TABS
40.0000 mg | ORAL_TABLET | Freq: Every day | ORAL | Status: DC
  Administered 2018-05-07: 40 mg via ORAL
  Filled 2018-05-07 (×2): qty 1

## 2018-05-07 MED ORDER — FENTANYL CITRATE (PF) 100 MCG/2ML IJ SOLN
25.0000 ug | INTRAMUSCULAR | Status: DC | PRN
  Administered 2018-05-07: 50 ug via INTRAVENOUS
  Administered 2018-05-08 (×3): 25 ug via INTRAVENOUS
  Administered 2018-05-08: 50 ug via INTRAVENOUS
  Administered 2018-05-09: 25 ug via INTRAVENOUS
  Administered 2018-05-09: 50 ug via INTRAVENOUS
  Administered 2018-05-09: 25 ug via INTRAVENOUS
  Administered 2018-05-10: 50 ug via INTRAVENOUS
  Filled 2018-05-07 (×9): qty 2

## 2018-05-07 MED ORDER — ONDANSETRON HCL 4 MG/2ML IJ SOLN: 4.0000 mg | Freq: Four times a day (QID) | INTRAMUSCULAR | Status: DC | PRN

## 2018-05-07 MED ORDER — SODIUM CHLORIDE 0.9% FLUSH
3.0000 mL | Freq: Two times a day (BID) | INTRAVENOUS | Status: DC
  Administered 2018-05-08 – 2018-05-13 (×8): 3 mL via INTRAVENOUS

## 2018-05-07 MED ORDER — VANCOMYCIN HCL IN DEXTROSE 750-5 MG/150ML-% IV SOLN: 750.0000 mg | INTRAVENOUS | Status: DC

## 2018-05-07 MED ORDER — ONDANSETRON HCL 4 MG PO TABS: 4.0000 mg | ORAL_TABLET | Freq: Four times a day (QID) | ORAL | Status: DC | PRN

## 2018-05-07 MED ORDER — ACETAMINOPHEN 650 MG RE SUPP: 650.0000 mg | Freq: Four times a day (QID) | RECTAL | Status: DC | PRN

## 2018-05-07 MED ORDER — SODIUM CHLORIDE 0.9 % IV SOLN
500.0000 mg | Freq: Two times a day (BID) | INTRAVENOUS | Status: DC
  Administered 2018-05-07 – 2018-05-08 (×4): 500 mg via INTRAVENOUS
  Filled 2018-05-07 (×5): qty 0.5

## 2018-05-07 MED ORDER — SODIUM CHLORIDE 0.9 % IV BOLUS (SEPSIS)
2000.0000 mL | Freq: Once | INTRAVENOUS | Status: AC
  Administered 2018-05-07: 2000 mL via INTRAVENOUS

## 2018-05-07 MED ORDER — ACETAMINOPHEN 325 MG PO TABS
650.0000 mg | ORAL_TABLET | Freq: Four times a day (QID) | ORAL | Status: DC | PRN
  Administered 2018-05-07 – 2018-05-08 (×3): 650 mg via ORAL
  Filled 2018-05-07 (×3): qty 2

## 2018-05-07 MED ORDER — ACETAMINOPHEN 325 MG PO TABS
650.0000 mg | ORAL_TABLET | Freq: Once | ORAL | Status: DC
  Filled 2018-05-07: qty 2

## 2018-05-07 MED ORDER — VANCOMYCIN HCL IN DEXTROSE 1-5 GM/200ML-% IV SOLN: 1000.0000 mg | Freq: Once | INTRAVENOUS | Status: DC

## 2018-05-07 MED ORDER — HEPARIN SODIUM (PORCINE) 5000 UNIT/ML IJ SOLN
5000.0000 [IU] | Freq: Three times a day (TID) | INTRAMUSCULAR | Status: DC
  Administered 2018-05-07 – 2018-05-08 (×3): 5000 [IU] via SUBCUTANEOUS
  Filled 2018-05-07 (×4): qty 1

## 2018-05-07 MED ORDER — SODIUM CHLORIDE 0.9 % IV BOLUS
1000.0000 mL | Freq: Once | INTRAVENOUS | Status: AC
  Administered 2018-05-07: 1000 mL via INTRAVENOUS

## 2018-05-07 MED ORDER — PIPERACILLIN-TAZOBACTAM 3.375 G IVPB 30 MIN
3.3750 g | Freq: Once | INTRAVENOUS | Status: AC
  Administered 2018-05-07: 3.375 g via INTRAVENOUS
  Filled 2018-05-07: qty 50

## 2018-05-07 MED ORDER — CYCLOSPORINE 0.05 % OP EMUL
1.0000 [drp] | Freq: Two times a day (BID) | OPHTHALMIC | Status: DC
  Administered 2018-05-07 – 2018-05-13 (×12): 1 [drp] via OPHTHALMIC
  Filled 2018-05-07 (×15): qty 1

## 2018-05-07 MED ORDER — SODIUM CHLORIDE 0.9 % IV SOLN
0.0000 ug/min | INTRAVENOUS | Status: DC
  Administered 2018-05-07: 20 ug/min via INTRAVENOUS
  Administered 2018-05-07: 25 ug/min via INTRAVENOUS
  Administered 2018-05-07: 40 ug/min via INTRAVENOUS
  Administered 2018-05-08: 30 ug/min via INTRAVENOUS
  Administered 2018-05-08: 45 ug/min via INTRAVENOUS
  Administered 2018-05-08: 40 ug/min via INTRAVENOUS
  Administered 2018-05-08: 55 ug/min via INTRAVENOUS
  Administered 2018-05-08 (×2): 60 ug/min via INTRAVENOUS
  Administered 2018-05-09 (×2): 70 ug/min via INTRAVENOUS
  Administered 2018-05-09: 35 ug/min via INTRAVENOUS
  Administered 2018-05-09: 50 ug/min via INTRAVENOUS
  Administered 2018-05-09: 70 ug/min via INTRAVENOUS
  Filled 2018-05-07 (×5): qty 1
  Filled 2018-05-07: qty 10
  Filled 2018-05-07 (×2): qty 1
  Filled 2018-05-07 (×2): qty 10
  Filled 2018-05-07 (×3): qty 1
  Filled 2018-05-07 (×2): qty 10

## 2018-05-07 MED ORDER — SODIUM CHLORIDE 0.9 % IV BOLUS
500.0000 mL | Freq: Once | INTRAVENOUS | Status: AC
  Administered 2018-05-07: 500 mL via INTRAVENOUS

## 2018-05-07 MED ORDER — VANCOMYCIN HCL 10 G IV SOLR
1500.0000 mg | Freq: Once | INTRAVENOUS | Status: AC
  Administered 2018-05-07: 1500 mg via INTRAVENOUS
  Filled 2018-05-07: qty 1500

## 2018-05-07 NOTE — Consult Note (Signed)
PULMONARY / CRITICAL CARE MEDICINE   Name: Miranda Hutchinson MRN: [id number] DOB: 02-21-1946    ADMISSION DATE:  05/07/2018 CONSULTATION DATE:  05/07/18  REFERRING MD:  Opyd  CHIEF COMPLAINT:  hypotension  HISTORY OF PRESENT ILLNESS:   Miranda Hutchinson is a 72 y.o. female with PMH as outlined below. She was brought to [facility] ED 05/06/18 after being found down on the floor at home with confusion.  Pt does not remember falling but states that she did not lose consciousness.  She states she hasn't felt well for the past few days with generalized weakness.  Denies any fevers/chills/sweats, headaches, chest pain, SOB, N/V/D, abd pain, myalgias.  No exposures to known sick contacts.  Has not had very good appetites for 3 days.  She has also had dysuria.  She had CT head and Cspine that was negative.  CXR showed left basilar atelectasis.  She had CT stone study that showed mild hydro with possible left UPJ stricture; however, no obstructing stone noted.  She was also found to have hypotension. She was given 4L IVF; however, MAPs remained in mid 50's.  PCCM was subsequently consulted for consideration transfer to ICU.   PAST MEDICAL HISTORY :  She  has a past medical history of Aortic insufficiency, Ascending aortic aneurysm ([facility]), Aspirin intolerance, Bilateral carotid artery disease ([place]), Coronary artery disease, Dyslipidemia, Heart murmur, Hyperlipidemia, Hyperthyroidism, Hypothyroidism, RBBB, and Renal insufficiency.  PAST SURGICAL HISTORY: She  has a past surgical history that includes Tonsillectomy; Abdominal hysterectomy (1990's); Cardiac catheterization (08/04/2012); Coronary artery bypass graft (08/10/2012); Thoracic aortic aneurysm repair (08/10/2012); Aortic valve replacement (08/10/2012); and Breast excisional biopsy (Left).  Allergies  Allergen Reactions  . Percocet [Oxycodone-Acetaminophen] Other (See Comments)    Hallucinations   . Asa [Aspirin] Nausea Only and Other (See Comments)    "hurts  my stomach"    No current facility-administered medications on file prior to encounter.    Current Outpatient Medications on File Prior to Encounter  Medication Sig  . cycloSPORINE (RESTASIS) 0.05 % ophthalmic emulsion Place 1 drop into both eyes 2 (two) times daily.   Marland Kitchen losartan (COZAAR) 100 MG tablet Take 50 mg by mouth daily.  . metoprolol tartrate (LOPRESSOR) 25 MG tablet Take 25 mg by mouth 2 (two) times daily.  . Multiple Vitamins-Minerals (ALIVE WOMENS 50+ PO) Take 1 tablet by mouth daily.  . rosuvastatin (CRESTOR) 40 MG tablet TAKE 1 TABLET(40 MG) BY MOUTH DAILY    FAMILY HISTORY:  Her indicated that the status of her mother is unknown. She indicated that her maternal grandmother is deceased. She indicated that her maternal grandfather is deceased. She indicated that her paternal grandmother is deceased. She indicated that her paternal grandfather is deceased.   SOCIAL HISTORY: She  reports that she quit smoking about 34 years ago. Her smoking use included cigarettes. She has a 0.10 pack-year smoking history. She has never used smokeless tobacco. She reports that she does not drink alcohol or use drugs.  REVIEW OF SYSTEMS:   All negative; except for those that are bolded, which indicate positives.  Constitutional: weight loss, weight gain, night sweats, fevers, chills, fatigue, weakness.  HEENT: headaches, sore throat, sneezing, nasal congestion, post nasal drip, difficulty swallowing, tooth/dental problems, visual complaints, visual changes, ear aches. Neuro: difficulty with speech, weakness, numbness, ataxia. CV:  chest pain, orthopnea, PND, swelling in lower extremities, dizziness, palpitations, syncope.  Resp: cough, hemoptysis, dyspnea, wheezing. GI: heartburn, indigestion, abdominal pain, nausea, vomiting, diarrhea, constipation, change in bowel  habits, loss of appetite, hematemesis, melena, hematochezia.  GU: dysuria, change in color of urine, urgency or frequency, flank  pain, hematuria. MSK: joint pain or swelling, decreased range of motion. Psych: change in mood or affect, depression, anxiety, suicidal ideations, homicidal ideations. Skin: rash, itching, bruising.   SUBJECTIVE:  Awake.  Getting renal US.  Denies pain.  VITAL SIGNS: BP (!) 90/38   Pulse 85   Temp 99.2 F (37.3 C) (Oral)   Resp 12   SpO2 97%   HEMODYNAMICS:    VENTILATOR SETTINGS:    INTAKE / OUTPUT: No intake/output data recorded.   PHYSICAL EXAMINATION: General: Adult female, in NAD. Neuro: A&O x 3, non-focal.  HEENT: [place]/AT. EOMI, sclerae anicteric. Cardiovascular: RRR, no M/R/G.  Lungs: Respirations even and unlabored.  CTA bilaterally, No W/R/R. Abdomen: BS x 4, soft, NT/ND.  Musculoskeletal: No gross deformities, no edema.  Skin: Intact, warm, no rashes.  LABS:  BMET Recent Labs  Lab 05/05/18 0201 05/07/18 0253  NA 141 135  K 4.5 4.3  CL 110 104  CO2 23 22  BUN 22* 23*  CREATININE 1.59* 2.58*  GLUCOSE 135* 150*    Electrolytes Recent Labs  Lab 05/05/18 0201 05/07/18 0253  CALCIUM 9.5 8.9    CBC Recent Labs  Lab 05/05/18 0201 05/07/18 0253  WBC 10.7* 11.1*  HGB 11.5* 10.9*  HCT 36.2 34.0*  PLT 230 177    Coag's No results for input(s): APTT, INR in the last 168 hours.  Sepsis Markers Recent Labs  Lab 05/07/18 0304 05/07/18 0447  LATICACIDVEN 0.98 0.93    ABG No results for input(s): PHART, PCO2ART, PO2ART in the last 168 hours.  Liver Enzymes Recent Labs  Lab 05/05/18 0201 05/07/18 0253  AST 19 22  ALT 17 13*  ALKPHOS 58 65  BILITOT 0.5 1.3*  ALBUMIN 3.6 2.8*    Cardiac Enzymes No results for input(s): TROPONINI, PROBNP in the last 168 hours.  Glucose Recent Labs  Lab 05/07/18 0211  GLUCAP 146*    Imaging Ct Head Wo Contrast  Result Date: 05/07/2018 CLINICAL DATA:  Patient found on the floor at home, incoherent and confused. EXAM: CT HEAD WITHOUT CONTRAST CT CERVICAL SPINE WITHOUT CONTRAST TECHNIQUE:  Multidetector CT imaging of the head and cervical spine was performed following the standard protocol without intravenous contrast. Multiplanar CT image reconstructions of the cervical spine were also generated. COMPARISON:  None FINDINGS: CT HEAD FINDINGS Brain: Normal involutional changes of the brain for age. No large vascular territory infarct, hemorrhage, midline shift or edema. No extra-axial fluid collections. No intra-axial mass. Vascular: No hyperdense vessel sign. Skull: Intact skull without acute nor suspicious osseous abnormality. Sinuses/Orbits: Mild ethmoid sinus mucosal thickening. No acute paranasal sinus disease. Clear mastoids. Intact orbits and globes. Other: None CT CERVICAL SPINE FINDINGS Alignment: Maintained cervical lordosis Skull base and vertebrae: No acute fracture. No aggressive osseous lesions. Probable subchondral cyst of the C4 vertebral body. Soft tissues and spinal canal: No prevertebral fluid or swelling. No visible canal hematoma. Disc levels: Moderate disc flattening at C5-6 with small posterior marginal osteophytes and uncinate spurring. No significant central canal stenosis. Minimal C5-6 neural foraminal encroachment on the left from uncinate spurs. Multilevel mild degenerative facet arthropathy. Upper chest: Negative. Other: None IMPRESSION: 1. Normal head CT for age. 2. Degenerative disc disease C5-6. No acute cervical spine fracture or posttraumatic listhesis. Electronically Signed   By: Ashley Royalty M.D.   On: 05/07/2018 02:55   Ct Cervical Spine Wo Contrast  Result Date: 05/07/2018 CLINICAL DATA:  Patient found on the floor at home, incoherent and confused. EXAM: CT HEAD WITHOUT CONTRAST CT CERVICAL SPINE WITHOUT CONTRAST TECHNIQUE: Multidetector CT imaging of the head and cervical spine was performed following the standard protocol without intravenous contrast. Multiplanar CT image reconstructions of the cervical spine were also generated. COMPARISON:  None FINDINGS: CT  HEAD FINDINGS Brain: Normal involutional changes of the brain for age. No large vascular territory infarct, hemorrhage, midline shift or edema. No extra-axial fluid collections. No intra-axial mass. Vascular: No hyperdense vessel sign. Skull: Intact skull without acute nor suspicious osseous abnormality. Sinuses/Orbits: Mild ethmoid sinus mucosal thickening. No acute paranasal sinus disease. Clear mastoids. Intact orbits and globes. Other: None CT CERVICAL SPINE FINDINGS Alignment: Maintained cervical lordosis Skull base and vertebrae: No acute fracture. No aggressive osseous lesions. Probable subchondral cyst of the C4 vertebral body. Soft tissues and spinal canal: No prevertebral fluid or swelling. No visible canal hematoma. Disc levels: Moderate disc flattening at C5-6 with small posterior marginal osteophytes and uncinate spurring. No significant central canal stenosis. Minimal C5-6 neural foraminal encroachment on the left from uncinate spurs. Multilevel mild degenerative facet arthropathy. Upper chest: Negative. Other: None IMPRESSION: 1. Normal head CT for age. 2. Degenerative disc disease C5-6. No acute cervical spine fracture or posttraumatic listhesis. Electronically Signed   By: Ashley Royalty M.D.   On: 05/07/2018 02:55   Dg Chest Port 1 View  Result Date: 05/07/2018 CLINICAL DATA:  72 year old female with fever and sepsis. EXAM: PORTABLE CHEST 1 VIEW COMPARISON:  Chest radiograph dated 09/06/2012 FINDINGS: Minimal left lung base atelectasis. Infiltrate is less likely. There is no focal consolidation, pleural effusion, or pneumothorax. Mild cardiomegaly. Median sternotomy wires and mechanical cardiac valve. Atherosclerotic calcification of the aortic arch. No acute osseous pathology. IMPRESSION: Left lung base atelectatic changes. Infiltrate is not excluded. Clinical correlation is recommended. No focal consolidation. Electronically Signed   By: Anner Crete M.D.   On: 05/07/2018 02:47      STUDIES:  CT renal stone 5/8 > mild left hydro, stricture at left UPJ.  56mm nonobstructing left renal pole calculus. Extensive diverticulosis. CT head 5/10 > negative. US renal 5/10 >   CULTURES: Blood 5/10 >  Urine 5/10 >   ANTIBIOTICS: Vanc 5/10 >  Merrem5/10 >   SIGNIFICANT EVENTS: 5/10 > admit.  LINES/TUBES: None.  DISCUSSION: 72 y.o. female admitted 5/10 after being found down on floor at home. Found to have sepsis, likely due to UTI.  ASSESSMENT / PLAN:  PULMONARY A: No acute issues. P:   No interventions required.  CARDIOVASCULAR A:  Shock - combo septic from UTI + hypovolemic from decreased PO intake.  A repeat sepsis assessment has been performed. Hx CAD, HTN, RBB, HLD, AAA (s/p bentall 2013). P:  Additional 500cc bolus now. Neosynephrine as needed for goal MAP > 65. Might need CVL. Continue preadmission crestor. Hold preadmission lopressor, losartan.  RENAL A:   AKI. P:   Assess renal US. Continue fluids. BMP in AM.  GASTROINTESTINAL A:   Nutrition. P:   NPO.  HEMATOLOGIC A:   VTE Prophylaxis. P:  SCD's / heparin. CBC in AM.  INFECTIOUS A:   Shock - combo septic from UTI + hypovolemic from decreased PO intake.  A repeat sepsis assessment has been performed. P:   Abx as above (vanc / merrem).  Follow cultures as above.  ENDOCRINE A:   Hx hypothyroidism - not on meds. P:   Assess TSH.  NEUROLOGIC  A:   Generalized weakness. P:   Supportive care.  Family updated: None available.  Interdisciplinary Family Meeting v Palliative Care Meeting:  Due by: 05/14/18.  CC time: 30 min.   Montey Hora, [facility] Pager: [phone]  or [phone] 05/07/2018, 6:48 AM

## 2018-05-07 NOTE — ED Notes (Signed)
Second peripheral iv line started on patient. Bolus going. Pt alert. Denies pain

## 2018-05-07 NOTE — ED Notes (Signed)
Assisted nurse with peri care and changing linen.

## 2018-05-07 NOTE — Progress Notes (Signed)
Pt admitted this am for sepsis secondary to UTI. BP was okay initially but has been trending down. She currently has NS being bolused through her 2 PIV's but BP still declining with MAP persisting <60. Discussed with PCCM, their care much appreciated; will start Neosynephrine now. Discussed plan with RN.

## 2018-05-07 NOTE — ED Notes (Signed)
BP not improving. Dr. Myna Hidalgo paged

## 2018-05-07 NOTE — Progress Notes (Signed)
Pharmacy Antibiotic Note  Miranda Hutchinson is a 72 y.o. female admitted on 05/07/2018 with urosepsis.  Pharmacy has been consulted for Vancomycin and Meropenem dosing.  Vancomycin 1500 mg IV given in ED at 0400    Plan: Vancomycin 750 mg IV q48h  Meropenem 500 mg IV q12h     Temp (24hrs), Avg:100.3 F (37.9 C), Min:99.2 F (37.3 C), Max:101.4 F (38.6 C)  Recent Labs  Lab 05/05/18 0201 05/07/18 0253 05/07/18 0304 05/07/18 0447  WBC 10.7* 11.1*  --   --   CREATININE 1.59* 2.58*  --   --   LATICACIDVEN  --   --  0.98 0.93    Estimated Creatinine Clearance: 22.5 mL/min (A) (by C-G formula based on SCr of 2.58 mg/dL (H)).    Allergies  Allergen Reactions  . Percocet [Oxycodone-Acetaminophen] Other (See Comments)    Hallucinations   . Asa [Aspirin] Nausea Only and Other (See Comments)    "hurts my stomach"    Caryl Pina 05/07/2018 6:50 AM

## 2018-05-07 NOTE — ED Notes (Signed)
Dr Halford Chessman notified  Of pts back pain discomfort and order clarified about ICU admit since it just said transfer to ICU, pt was agitated , writhing in bed, bp up 172/115, oral temp 101.1

## 2018-05-07 NOTE — ED Notes (Signed)
Pt responding well to additional fluids

## 2018-05-07 NOTE — ED Notes (Signed)
PT BLADDER SCANNED LESS THAN 30ML MEASURED

## 2018-05-07 NOTE — ED Notes (Signed)
Dr. Gilford Raid ICU at bedside.

## 2018-05-07 NOTE — ED Notes (Addendum)
Delay in lab draw,  Pt not in room 

## 2018-05-07 NOTE — ED Notes (Signed)
Pt can be aroused and can answer questions able to swallow tylenol w/o diff

## 2018-05-07 NOTE — ED Notes (Signed)
Fluids stopped. Pt RR increased. Pt complaint of back pain

## 2018-05-07 NOTE — ED Notes (Signed)
ULS at bedside 

## 2018-05-07 NOTE — ED Notes (Signed)
Pt alert and oriented x3. Disoriented to time. Pt unable to state the month but was able to state what the current year was. PT talking in complete sentences with clear speech. Pt unable to recall events that brought her to the hospital but can remember what she did earlier in the day. No injuries noted on patient.

## 2018-05-07 NOTE — ED Triage Notes (Signed)
Pt was found on the flor at home. Pt was incoherent and confused. Pt saw PCP earlier in the day and was diagnosed with a UTI and kidney stones. Pt was found on the floor at home. Soiled in urine. Placed on C-collar for precautions.

## 2018-05-07 NOTE — ED Provider Notes (Addendum)
[facility] EMERGENCY DEPARTMENT Provider Note   CSN: [id number] Arrival date & time: 05/07/18  0200     History   Chief Complaint Chief Complaint  Patient presents with  . Altered Mental Status  . Fall    ???    HPI Miranda Hutchinson is a 72 y.o. female.  The history is provided by the patient and a relative. The history is limited by the condition of the patient (Altered mental status).  She has history of hyperlipidemia, hypothyroidism, renal insufficiency, coronary artery disease, ascending aortic aneurysm, hypertension and was brought in by ambulance after family member found her on the floor and not talking coherently.  She had been seen in [facility] emergency 2 days ago with left flank pain and found to have hydronephrosis and was referred to urology.  She had seen her urologist today and also her cardiologist today.  No medication changes were made.  She had been doing well until her son found her on the floor tonight.  She does not remember what happened.  Her son showed me a text from another family member who had been with her when she went to the urologist and apparently it was felt that she had congenital hydronephrosis, but no sign of urinary tract infection at that time.  Her son says she is more responsive now, but not back to baseline.  Past Medical History:  Diagnosis Date  . Aortic insufficiency    a. ascending aortic root aneursym with AI s/p Bentall procedure with bioprosthetic aortic valve 07/2012.  . Ascending aortic aneurysm ([place])    a. s/p Bentall 2013.  . Aspirin intolerance   . Bilateral carotid artery disease ([place])    a.  carotid duplex 2013 showed 40-59% distal RICA and 76-16% mLICA stenosis. b. Duplex 09/2017 - <40% BICA.  Marland Kitchen Coronary artery disease    a. 3 vessel ASCAD s/p CABG (LIMA to LAD, SVG to OM, SVG to PDA) at time of Bentall 07/2012.  Marland Kitchen Dyslipidemia   . Heart murmur   . Hyperlipidemia   . Hyperthyroidism   . Hypothyroidism    . RBBB   . Renal insufficiency    a. Cr 1.31 in 2016, previously 1 range.    Patient Active Problem List   Diagnosis Date Noted  . Bilateral carotid artery disease ([place]) 05/06/2018  . S/P ascending aortic aneurysm repair 05/06/2018  . Hypotension 05/06/2018  . Multinodular goiter 09/12/2015  . Hyperthyroidism 09/12/2015  . Dizziness 07/03/2014  . Blurred vision 07/03/2014  . Headache(784.0) 07/03/2014  . S/P CABG x 3 08/16/2012  . S/P ascending aortic replacement 08/16/2012  . S/P AVR (aortic valve replacement) 08/16/2012  . Essential hypertension, benign 08/05/2012  . Pure hypercholesterolemia 08/05/2012  . Obesity, unspecified 08/05/2012  . Coronary artery disease 08/04/2012  . Aortic valve insufficiency 08/04/2012  . Dilated aortic root ([place]) 08/04/2012    Past Surgical History:  Procedure Laterality Date  . ABDOMINAL HYSTERECTOMY  1990's  . AORTIC VALVE REPLACEMENT  08/10/2012   Procedure: AORTIC VALVE REPLACEMENT (AVR);  Surgeon: Grace Isaac, MD;  Location: [facility];  Service: Open Heart Surgery;  Laterality: N/A;  . BREAST EXCISIONAL BIOPSY Left   . CARDIAC CATHETERIZATION  08/04/2012  . CORONARY ARTERY BYPASS GRAFT  08/10/2012   Procedure: CORONARY ARTERY BYPASS GRAFTING (CABG);  Surgeon: Grace Isaac, MD;  Location: [facility] [facility];  Service: Open Heart Surgery;  Laterality: N/A;  . THORACIC AORTIC ANEURYSM REPAIR  08/10/2012   Procedure:  THORACIC ASCENDING ANEURYSM REPAIR (AAA);  Surgeon: Grace Isaac, MD;  Location: [facility];  Service: Open Heart Surgery;  Laterality: N/A;  . TONSILLECTOMY     "I was a little child"     OB History   None      Home Medications    Prior to Admission medications   Medication Sig Start Date End Date Taking? Authorizing Provider  cycloSPORINE (RESTASIS) 0.05 % ophthalmic emulsion Place 1 drop into both eyes 2 (two) times daily.     [provider]  losartan (COZAAR) 100 MG tablet Take 50 mg by mouth daily.    [provider]  metoprolol tartrate (LOPRESSOR) 25 MG tablet Take 25 mg by mouth 2 (two) times daily.    [provider]  Multiple Vitamins-Minerals (ALIVE WOMENS 50+ PO) Take 1 tablet by mouth daily.    [provider]  rosuvastatin (CRESTOR) 40 MG tablet TAKE 1 TABLET(40 MG) BY MOUTH DAILY 10/19/17   Charlie Pitter, PA-C    Family History Family History  Problem Relation Age of Onset  . Lung cancer Mother   . Thyroid disease Mother     Social History Social History   Tobacco Use  . Smoking status: Former Smoker    Packs/day: 0.05    Years: 2.00    Pack years: 0.10    Types: Cigarettes    Last attempt to quit: 12/30/1983    Years since quitting: 34.3  . Smokeless tobacco: Never Used  Substance Use Topics  . Alcohol use: No  . Drug use: No     Allergies   Percocet [oxycodone-acetaminophen] and Asa [aspirin]   Review of Systems Review of Systems  Unable to perform ROS: Mental status change     Physical Exam Updated Vital Signs Pulse (!) 108   Temp (!) 101.4 F (38.6 C) (Oral)   Resp (!) 33   SpO2 95%   Physical Exam  Nursing note and vitals reviewed.  72 year old female, resting comfortably and in no acute distress. Vital signs are significant for fever, tachypnea, tachycardia. Oxygen saturation is 95%, which is normal. Head is normocephalic and atraumatic. PERRLA, EOMI. Oropharynx is clear. Neck is immobilized in a stiff cervical collar and is nontender without adenopathy or JVD. Back is nontender and there is no CVA tenderness. Lungs are clear without rales, wheezes, or rhonchi. Chest is nontender. Heart has regular rate and rhythm without murmur. Abdomen is soft, flat, nontender without masses or hepatosplenomegaly and peristalsis is normoactive. Extremities have no cyanosis or edema, full range of motion is present. Skin is warm and dry without rash. Neurologic: She is awake but slow to answer questions.  She is oriented to person and  place but not time. Cranial nerves are intact, there are no motor or sensory deficits.  ED Treatments / Results  Labs (all labs ordered are listed, but only abnormal results are displayed) Labs Reviewed  COMPREHENSIVE METABOLIC PANEL - Abnormal; Notable for the following components:      Result Value   Glucose, Bld 150 (*)    BUN 23 (*)    Creatinine, Ser 2.58 (*)    Albumin 2.8 (*)    ALT 13 (*)    Total Bilirubin 1.3 (*)    GFR calc non Af Amer 17 (*)    GFR calc Af Amer 20 (*)    All other components within normal limits  CBC WITH DIFFERENTIAL/PLATELET - Abnormal; Notable for the following components:  WBC 11.1 (*)    Hemoglobin 10.9 (*)    HCT 34.0 (*)    Neutro Abs 9.7 (*)    Lymphs Abs 0.6 (*)    All other components within normal limits  URINALYSIS, ROUTINE W REFLEX MICROSCOPIC - Abnormal; Notable for the following components:   Color, Urine AMBER (*)    APPearance CLOUDY (*)    Hgb urine dipstick MODERATE (*)    Ketones, ur 5 (*)    Protein, ur 100 (*)    Nitrite POSITIVE (*)    Leukocytes, UA MODERATE (*)    WBC, UA >50 (*)    Bacteria, UA MANY (*)    All other components within normal limits  CBG MONITORING, ED - Abnormal; Notable for the following components:   Glucose-Capillary 146 (*)    All other components within normal limits  CULTURE, BLOOD (ROUTINE X 2)  CULTURE, BLOOD (ROUTINE X 2)  URINE CULTURE  CK  PROTIME-INR  APTT  SODIUM, URINE, RANDOM  UREA NITROGEN, URINE  CREATININE, URINE, RANDOM  I-STAT CG4 LACTIC ACID, ED  I-STAT CG4 LACTIC ACID, ED    EKG EKG Interpretation  Date/Time:  Friday May 07 2018 02:02:49 EDT Ventricular Rate:  107 PR Interval:    QRS Duration: 129 QT Interval:  358 QTC Calculation: 478 R Axis:   101 Text Interpretation:  Sinus tachycardia RBBB and LPFB When compared with ECG of 08/11/2012, T wave inversion Inferior leads is no longer present Confirmed by Delora Fuel ([id number]) on 05/07/2018 2:24:32  AM   Radiology Ct Head Wo Contrast  Result Date: 05/07/2018 CLINICAL DATA:  Patient found on the floor at home, incoherent and confused. EXAM: CT HEAD WITHOUT CONTRAST CT CERVICAL SPINE WITHOUT CONTRAST TECHNIQUE: Multidetector CT imaging of the head and cervical spine was performed following the standard protocol without intravenous contrast. Multiplanar CT image reconstructions of the cervical spine were also generated. COMPARISON:  None FINDINGS: CT HEAD FINDINGS Brain: Normal involutional changes of the brain for age. No large vascular territory infarct, hemorrhage, midline shift or edema. No extra-axial fluid collections. No intra-axial mass. Vascular: No hyperdense vessel sign. Skull: Intact skull without acute nor suspicious osseous abnormality. Sinuses/Orbits: Mild ethmoid sinus mucosal thickening. No acute paranasal sinus disease. Clear mastoids. Intact orbits and globes. Other: None CT CERVICAL SPINE FINDINGS Alignment: Maintained cervical lordosis Skull base and vertebrae: No acute fracture. No aggressive osseous lesions. Probable subchondral cyst of the C4 vertebral body. Soft tissues and spinal canal: No prevertebral fluid or swelling. No visible canal hematoma. Disc levels: Moderate disc flattening at C5-6 with small posterior marginal osteophytes and uncinate spurring. No significant central canal stenosis. Minimal C5-6 neural foraminal encroachment on the left from uncinate spurs. Multilevel mild degenerative facet arthropathy. Upper chest: Negative. Other: None IMPRESSION: 1. Normal head CT for age. 2. Degenerative disc disease C5-6. No acute cervical spine fracture or posttraumatic listhesis. Electronically Signed   By: Ashley Royalty M.D.   On: 05/07/2018 02:55   Ct Cervical Spine Wo Contrast  Result Date: 05/07/2018 CLINICAL DATA:  Patient found on the floor at home, incoherent and confused. EXAM: CT HEAD WITHOUT CONTRAST CT CERVICAL SPINE WITHOUT CONTRAST TECHNIQUE: Multidetector CT  imaging of the head and cervical spine was performed following the standard protocol without intravenous contrast. Multiplanar CT image reconstructions of the cervical spine were also generated. COMPARISON:  None FINDINGS: CT HEAD FINDINGS Brain: Normal involutional changes of the brain for age. No large vascular territory infarct, hemorrhage, midline shift or edema.  No extra-axial fluid collections. No intra-axial mass. Vascular: No hyperdense vessel sign. Skull: Intact skull without acute nor suspicious osseous abnormality. Sinuses/Orbits: Mild ethmoid sinus mucosal thickening. No acute paranasal sinus disease. Clear mastoids. Intact orbits and globes. Other: None CT CERVICAL SPINE FINDINGS Alignment: Maintained cervical lordosis Skull base and vertebrae: No acute fracture. No aggressive osseous lesions. Probable subchondral cyst of the C4 vertebral body. Soft tissues and spinal canal: No prevertebral fluid or swelling. No visible canal hematoma. Disc levels: Moderate disc flattening at C5-6 with small posterior marginal osteophytes and uncinate spurring. No significant central canal stenosis. Minimal C5-6 neural foraminal encroachment on the left from uncinate spurs. Multilevel mild degenerative facet arthropathy. Upper chest: Negative. Other: None IMPRESSION: 1. Normal head CT for age. 2. Degenerative disc disease C5-6. No acute cervical spine fracture or posttraumatic listhesis. Electronically Signed   By: Ashley Royalty M.D.   On: 05/07/2018 02:55   Dg Chest Port 1 View  Result Date: 05/07/2018 CLINICAL DATA:  72 year old female with fever and sepsis. EXAM: PORTABLE CHEST 1 VIEW COMPARISON:  Chest radiograph dated 09/06/2012 FINDINGS: Minimal left lung base atelectasis. Infiltrate is less likely. There is no focal consolidation, pleural effusion, or pneumothorax. Mild cardiomegaly. Median sternotomy wires and mechanical cardiac valve. Atherosclerotic calcification of the aortic arch. No acute osseous  pathology. IMPRESSION: Left lung base atelectatic changes. Infiltrate is not excluded. Clinical correlation is recommended. No focal consolidation. Electronically Signed   By: Anner Crete M.D.   On: 05/07/2018 02:47    Procedures Procedures  CRITICAL CARE Performed by: Delora Fuel Total critical care time: 60 minutes Critical care time was exclusive of separately billable procedures and treating other patients. Critical care was necessary to treat or prevent imminent or life-threatening deterioration. Critical care was time spent personally by me on the following activities: development of treatment plan with patient and/or surrogate as well as nursing, discussions with consultants, evaluation of patient's response to treatment, examination of patient, obtaining history from patient or surrogate, ordering and performing treatments and interventions, ordering and review of laboratory studies, ordering and review of radiographic studies, pulse oximetry and re-evaluation of patient's condition.  Medications Ordered in ED Medications  acetaminophen (TYLENOL) tablet 650 mg (650 mg Oral Refused 05/07/18 0319)  vancomycin (VANCOCIN) 1,500 mg in sodium chloride 0.9 % 500 mL IVPB (1,500 mg Intravenous New Bag/Given 05/07/18 0408)  sodium chloride 0.9 % bolus 1,000 mL (1,000 mLs Intravenous New Bag/Given 05/07/18 0529)  sodium chloride 0.9 % bolus 2,000 mL (has no administration in time range)  cycloSPORINE (RESTASIS) 0.05 % ophthalmic emulsion 1 drop (has no administration in time range)  rosuvastatin (CRESTOR) tablet 40 mg (has no administration in time range)  heparin injection 5,000 Units (has no administration in time range)  sodium chloride flush (NS) 0.9 % injection 3 mL (has no administration in time range)  acetaminophen (TYLENOL) tablet 650 mg (has no administration in time range)    Or  acetaminophen (TYLENOL) suppository 650 mg (has no administration in time range)  ondansetron (ZOFRAN)  tablet 4 mg (has no administration in time range)    Or  ondansetron (ZOFRAN) injection 4 mg (has no administration in time range)  fentaNYL (SUBLIMAZE) injection 25-50 mcg (has no administration in time range)  meropenem (MERREM) 500 mg in sodium chloride 0.9 % 100 mL IVPB (has no administration in time range)  piperacillin-tazobactam (ZOSYN) IVPB 3.375 g (0 g Intravenous Stopped 05/07/18 0407)     Initial Impression / Assessment and Plan / ED  Course  I have reviewed the triage vital signs and the nursing notes.  Pertinent labs & imaging results that were available during my care of the patient were reviewed by me and considered in my medical decision making (see chart for details).  Fever with altered mental status-presumed sepsis.  She started on sepsis pathway.  She will be started on antibiotics for sepsis of undetermined origin.  She will be sent for CT of head and cervical spine since it is not known if she fell.  Old records are reviewed confirming ED visit 2 days ago with hydronephrosis.  Urinalysis at that time did have pyuria with that significant bacteria in the urine and with negative nitrite.  Lactic acid level is normal.  Metabolic panel shows evidence of acute kidney injury with creatinine 2.58, up from 1.59 two days ago.  Urinalysis shows clear evidence of infection with positive nitrite and many bacteria.  IV fluids are initiated.  CK is normal.  WBC is mildly elevated but with a left shift.  Mild anemia is present and not significantly changed.  Chest x-ray shows possible pneumonia, but clinically, the source is UTI.  Case is discussed with Dr. Myna Hidalgo, who agrees to admit the patient.  Final Clinical Impressions(s) / ED Diagnoses   Final diagnoses:  Urinary tract infection with hematuria, site unspecified  Acute kidney injury (nontraumatic) (HCC)  Normochromic normocytic anemia  Altered mental status, unspecified altered mental status type    ED Discharge Orders    None        Delora Fuel, MD 87/86/76 [id number]    Delora Fuel, MD 47/09/62 [phone]

## 2018-05-07 NOTE — H&P (Signed)
History and Physical    Miranda Hutchinson:[id number] DOB: 08-Jun-1946 DOA: 05/07/2018  PCP: Antony Contras, MD   Patient coming from: Home  Chief Complaint: Found down, confused, dysuria  HPI: Miranda Hutchinson is a 72 y.o. female with medical history significant for hypertension, coronary artery disease status post CABG, ascending aortic aneurysm status post Bentall procedure in 2013, and recent increase in serum creatinine with hydronephrosis noted on recent CT, now presenting to the emergency department with confusion after being found on the floor at home.  Patient was seen in the emergency department a couple days ago with left flank pain, underwent CT stone study that revealed mild hydronephrosis with possible left UPJ stricture, but no obstructing stone identified.  She was referred to urology and was seen in the clinic on 05/06/2018 and reports being told that the CT findings are likely chronic.  She has persistent left flank pain and has gone on to develop dysuria.  ED Course: Upon arrival to the ED, patient is found to be febrile to 38.6 C, saturating well on room air, tachycardic in the 110s, and with normal blood pressure initially.  Noncontrast head CT was negative for acute intracranial abnormality and CT cervical spine is negative for acute fracture or subluxation.  Chest x-ray is notable for left basilar atelectasis.  EKG features a sinus tachycardia with RBBB and LPFB.  Chemistry panel is concerning for creatinine 2.58, up from 1.59 two days earlier and up from less than 1 in October.  CBC is notable for leukocytosis to 11,100 and a normocytic anemia with hemoglobin of 10.9.  Lactic acid is reassuringly normal.  Urinalysis returned and is consistent with infection.  Urine sample will be sent for culture.  Blood cultures were collected in the ED, vancomycin and Zosyn were ordered, and the patient was given a 1 L normal saline bolus.  Blood pressure has fallen in the emergency department  with multiple readings now less than 90 systolic.  She will be given additional fluid bolus and admitted to the stepdown unit for ongoing management of severe sepsis suspected secondary to UTI.  Review of Systems:  All other systems reviewed and apart from HPI, are negative.  Past Medical History:  Diagnosis Date  . Aortic insufficiency    a. ascending aortic root aneursym with AI s/p Bentall procedure with bioprosthetic aortic valve 07/2012.  . Ascending aortic aneurysm ([place])    a. s/p Bentall 2013.  . Aspirin intolerance   . Bilateral carotid artery disease ([place])    a.  carotid duplex 2013 showed 40-59% distal RICA and 81-19% mLICA stenosis. b. Duplex 09/2017 - <40% BICA.  Marland Kitchen Coronary artery disease    a. 3 vessel ASCAD s/p CABG (LIMA to LAD, SVG to OM, SVG to PDA) at time of Bentall 07/2012.  Marland Kitchen Dyslipidemia   . Heart murmur   . Hyperlipidemia   . Hyperthyroidism   . Hypothyroidism   . RBBB   . Renal insufficiency    a. Cr 1.31 in 2016, previously 1 range.    Past Surgical History:  Procedure Laterality Date  . ABDOMINAL HYSTERECTOMY  1990's  . AORTIC VALVE REPLACEMENT  08/10/2012   Procedure: AORTIC VALVE REPLACEMENT (AVR);  Surgeon: Grace Isaac, MD;  Location: [place];  Service: Open Heart Surgery;  Laterality: N/A;  . BREAST EXCISIONAL BIOPSY Left   . CARDIAC CATHETERIZATION  08/04/2012  . CORONARY ARTERY BYPASS GRAFT  08/10/2012   Procedure: CORONARY ARTERY BYPASS GRAFTING (CABG);  Surgeon:  Grace Isaac, MD;  Location: [place];  Service: Open Heart Surgery;  Laterality: N/A;  . THORACIC AORTIC ANEURYSM REPAIR  08/10/2012   Procedure: THORACIC ASCENDING ANEURYSM REPAIR (AAA);  Surgeon: Grace Isaac, MD;  Location: [place];  Service: Open Heart Surgery;  Laterality: N/A;  . TONSILLECTOMY     "I was a little child"     reports that she quit smoking about 34 years ago. Her smoking use included cigarettes. She has a 0.10 pack-year smoking history. She has never used  smokeless tobacco. She reports that she does not drink alcohol or use drugs.  Allergies  Allergen Reactions  . Percocet [Oxycodone-Acetaminophen] Other (See Comments)    Hallucinations   . Asa [Aspirin] Nausea Only and Other (See Comments)    "hurts my stomach"    Family History  Problem Relation Age of Onset  . Lung cancer Mother   . Thyroid disease Mother      Prior to Admission medications   Medication Sig Start Date End Date Taking? Authorizing Provider  cycloSPORINE (RESTASIS) 0.05 % ophthalmic emulsion Place 1 drop into both eyes 2 (two) times daily.     [provider]  losartan (COZAAR) 100 MG tablet Take 50 mg by mouth daily.    [provider]  metoprolol tartrate (LOPRESSOR) 25 MG tablet Take 25 mg by mouth 2 (two) times daily.    [provider]  Multiple Vitamins-Minerals (ALIVE WOMENS 50+ PO) Take 1 tablet by mouth daily.    [provider]  rosuvastatin (CRESTOR) 40 MG tablet TAKE 1 TABLET(40 MG) BY MOUTH DAILY 10/19/17   Charlie Pitter, PA-C    Physical Exam: Vitals:   05/07/18 0500 05/07/18 0515 05/07/18 0529 05/07/18 0530  BP: (!) 88/48 (!) 88/50  (!) 95/50  Pulse: 95 94  93  Resp:      Temp:   99.2 F (37.3 C)   TempSrc:   Oral   SpO2: 97% 97%  93%      Constitutional: NAD, calm  Eyes: PERTLA, lids and conjunctivae normal ENMT: Mucous membranes are moist. Posterior pharynx clear of any exudate or lesions.   Neck: normal, supple, no masses, no thyromegaly Respiratory: clear to auscultation bilaterally, no wheezing, no crackles. Normal respiratory effort.    Cardiovascular: Rate ~110 and regular. No significant JVD. Abdomen: Soft, no distension. Suprapubic and left flank tenderness without rebound pain or guarding. Bowel sounds active.  Musculoskeletal: no clubbing / cyanosis. No joint deformity upper and lower extremities.   Skin: no significant rashes, lesions, ulcers. Warm, dry, well-perfused. Neurologic: No  facial asymmetry. Sensation intact, patellar DTR normal. Moving all extremities.  Psychiatric: Somnolent, easily roused. Oriented to person and place, but not month.       Labs on Admission: I have personally reviewed following labs and imaging studies  CBC: Recent Labs  Lab 05/05/18 0201 05/07/18 0253  WBC 10.7* 11.1*  NEUTROABS 8.1* 9.7*  HGB 11.5* 10.9*  HCT 36.2 34.0*  MCV 88.3 87.2  PLT 230 235   Basic Metabolic Panel: Recent Labs  Lab 05/05/18 0201 05/07/18 0253  NA 141 135  K 4.5 4.3  CL 110 104  CO2 23 22  GLUCOSE 135* 150*  BUN 22* 23*  CREATININE 1.59* 2.58*  CALCIUM 9.5 8.9   GFR: Estimated Creatinine Clearance: 22.5 mL/min (A) (by C-G formula based on SCr of 2.58 mg/dL (H)). Liver Function Tests: Recent Labs  Lab 05/05/18 0201 05/07/18 0253  AST 19 22  ALT 17 13*  ALKPHOS 58 65  BILITOT 0.5 1.3*  PROT 7.2 6.5  ALBUMIN 3.6 2.8*   Recent Labs  Lab 05/05/18 0201  LIPASE 31   No results for input(s): AMMONIA in the last 168 hours. Coagulation Profile: No results for input(s): INR, PROTIME in the last 168 hours. Cardiac Enzymes: Recent Labs  Lab 05/07/18 0253  CKTOTAL 176   BNP (last 3 results) Recent Labs    01/28/18 1539  PROBNP 67   HbA1C: No results for input(s): HGBA1C in the last 72 hours. CBG: Recent Labs  Lab 05/07/18 0211  GLUCAP 146*   Lipid Profile: No results for input(s): CHOL, HDL, LDLCALC, TRIG, CHOLHDL, LDLDIRECT in the last 72 hours. Thyroid Function Tests: No results for input(s): TSH, T4TOTAL, FREET4, T3FREE, THYROIDAB in the last 72 hours. Anemia Panel: No results for input(s): VITAMINB12, FOLATE, FERRITIN, TIBC, IRON, RETICCTPCT in the last 72 hours. Urine analysis:    Component Value Date/Time   COLORURINE AMBER (A) 05/07/2018 0500   APPEARANCEUR CLOUDY (A) 05/07/2018 0500   LABSPEC 1.019 05/07/2018 0500   PHURINE 5.0 05/07/2018 0500   GLUCOSEU NEGATIVE 05/07/2018 0500   HGBUR MODERATE (A) 05/07/2018  0500   BILIRUBINUR NEGATIVE 05/07/2018 0500   KETONESUR 5 (A) 05/07/2018 0500   PROTEINUR 100 (A) 05/07/2018 0500   UROBILINOGEN 0.2 11/10/2015 0001   NITRITE POSITIVE (A) 05/07/2018 0500   LEUKOCYTESUR MODERATE (A) 05/07/2018 0500   Sepsis Labs: @LABRCNTIP (procalcitonin:4,lacticidven:4) )No results found for this or any previous visit (from the past 240 hour(s)).   Radiological Exams on Admission: Ct Head Wo Contrast  Result Date: 05/07/2018 CLINICAL DATA:  Patient found on the floor at home, incoherent and confused. EXAM: CT HEAD WITHOUT CONTRAST CT CERVICAL SPINE WITHOUT CONTRAST TECHNIQUE: Multidetector CT imaging of the head and cervical spine was performed following the standard protocol without intravenous contrast. Multiplanar CT image reconstructions of the cervical spine were also generated. COMPARISON:  None FINDINGS: CT HEAD FINDINGS Brain: Normal involutional changes of the brain for age. No large vascular territory infarct, hemorrhage, midline shift or edema. No extra-axial fluid collections. No intra-axial mass. Vascular: No hyperdense vessel sign. Skull: Intact skull without acute nor suspicious osseous abnormality. Sinuses/Orbits: Mild ethmoid sinus mucosal thickening. No acute paranasal sinus disease. Clear mastoids. Intact orbits and globes. Other: None CT CERVICAL SPINE FINDINGS Alignment: Maintained cervical lordosis Skull base and vertebrae: No acute fracture. No aggressive osseous lesions. Probable subchondral cyst of the C4 vertebral body. Soft tissues and spinal canal: No prevertebral fluid or swelling. No visible canal hematoma. Disc levels: Moderate disc flattening at C5-6 with small posterior marginal osteophytes and uncinate spurring. No significant central canal stenosis. Minimal C5-6 neural foraminal encroachment on the left from uncinate spurs. Multilevel mild degenerative facet arthropathy. Upper chest: Negative. Other: None IMPRESSION: 1. Normal head CT for age. 2.  Degenerative disc disease C5-6. No acute cervical spine fracture or posttraumatic listhesis. Electronically Signed   By: Ashley Royalty M.D.   On: 05/07/2018 02:55   Ct Cervical Spine Wo Contrast  Result Date: 05/07/2018 CLINICAL DATA:  Patient found on the floor at home, incoherent and confused. EXAM: CT HEAD WITHOUT CONTRAST CT CERVICAL SPINE WITHOUT CONTRAST TECHNIQUE: Multidetector CT imaging of the head and cervical spine was performed following the standard protocol without intravenous contrast. Multiplanar CT image reconstructions of the cervical spine were also generated. COMPARISON:  None FINDINGS: CT HEAD FINDINGS Brain: Normal involutional changes of the brain for age. No large vascular territory infarct,  hemorrhage, midline shift or edema. No extra-axial fluid collections. No intra-axial mass. Vascular: No hyperdense vessel sign. Skull: Intact skull without acute nor suspicious osseous abnormality. Sinuses/Orbits: Mild ethmoid sinus mucosal thickening. No acute paranasal sinus disease. Clear mastoids. Intact orbits and globes. Other: None CT CERVICAL SPINE FINDINGS Alignment: Maintained cervical lordosis Skull base and vertebrae: No acute fracture. No aggressive osseous lesions. Probable subchondral cyst of the C4 vertebral body. Soft tissues and spinal canal: No prevertebral fluid or swelling. No visible canal hematoma. Disc levels: Moderate disc flattening at C5-6 with small posterior marginal osteophytes and uncinate spurring. No significant central canal stenosis. Minimal C5-6 neural foraminal encroachment on the left from uncinate spurs. Multilevel mild degenerative facet arthropathy. Upper chest: Negative. Other: None IMPRESSION: 1. Normal head CT for age. 2. Degenerative disc disease C5-6. No acute cervical spine fracture or posttraumatic listhesis. Electronically Signed   By: Ashley Royalty M.D.   On: 05/07/2018 02:55   Dg Chest Port 1 View  Result Date: 05/07/2018 CLINICAL DATA:  72 year old  female with fever and sepsis. EXAM: PORTABLE CHEST 1 VIEW COMPARISON:  Chest radiograph dated 09/06/2012 FINDINGS: Minimal left lung base atelectasis. Infiltrate is less likely. There is no focal consolidation, pleural effusion, or pneumothorax. Mild cardiomegaly. Median sternotomy wires and mechanical cardiac valve. Atherosclerotic calcification of the aortic arch. No acute osseous pathology. IMPRESSION: Left lung base atelectatic changes. Infiltrate is not excluded. Clinical correlation is recommended. No focal consolidation. Electronically Signed   By: Anner Crete M.D.   On: 05/07/2018 02:47    EKG: Independently reviewed. Sinus tachycardia (rate 107), RBBB, LPFB.   Assessment/Plan   1. Severe sepsis secondary to UTI  - Presents with fever, tachycardia, and leukocytosis after being found on floor with confusion at home; she went on to become hypotensive in ED  - CXR without appreciable infiltrate and there are no respiratory sxs  - UA consistent with infection and pt reports dysuria and left flank pain  - She had mild left hydronephrosis on recent CT that was suspected secondary to stricture at left UPJ, saw urology in clinic on 5/9 and reports being told likely congenital  - Blood cultures collected in ED and urine culture ordered  - She was given 1 liter NS in ED but SBP is now sustaining <90 and 2 more liters will be given to complete the 30 cc/kg   - Check renal US and consult urology for possible drainage if needed  - Given her severe acute illness and potential obstruction, prefer to treat with meropenem and vancomycin initially pending cultures and clinical course (this is the expert recommendation outlined in UpToDate)  2. Acute kidney injury  - SCr is 2.58 on admission, up from 1.59 on 5/8 and <1 in October  - Likely prerenal in setting of sepsis with hypotension; ATN is possible; post-renal component also possible given hydronephrosis seen on recent CT  - She is being  fluid-resuscitated  - Check urine chemistries  - Check renal US given sepsis suspected secondary to UTI; if hydro worse and she fails to improve with current tx, may need ureteral stent or percutaneous nephrostomy   3. CAD  - No anginal complaints on admission  - Recently complained of vague chest discomfort and her cardiologist was planning for stress test in coming weeks  - EKG is stable on admission and no chest pain complaint or dyspnea - Continue Crestor, hold beta-blocker and ARB in light of sepsis with hypotension    4. Hypertension  -  Hypotensive in ED in setting of sepsis  - Hold Lopressor and losartan    DVT prophylaxis: sq heparin  Code Status: Full  Family Communication: Discussed with patient  Consults called: None Admission status: Inpatient     Vianne Bulls, MD [facility] Pager [phone]  If 7PM-7AM, please contact night-coverage [email or web address] Password [email or web address]  05/07/2018, 5:38 AM

## 2018-05-07 NOTE — ED Notes (Addendum)
Pt has two additional normal saline bolus infusing. Provider stated to complete additional bolus and reassess BP before starting NEO-Synephrine. Lungs sounds clear, diminished lower breath sounds. No new adventitious sounds while receiving fluid bolus.

## 2018-05-08 ENCOUNTER — Inpatient Hospital Stay (HOSPITAL_COMMUNITY): Payer: PPO

## 2018-05-08 ENCOUNTER — Encounter (HOSPITAL_COMMUNITY): Payer: Self-pay | Admitting: Interventional Radiology

## 2018-05-08 ENCOUNTER — Other Ambulatory Visit: Payer: Self-pay

## 2018-05-08 HISTORY — PX: IR NEPHROSTOMY PLACEMENT LEFT: IMG6063

## 2018-05-08 LAB — CBC
HCT: 31.1 % — ABNORMAL LOW (ref 36.0–46.0)
HEMATOCRIT: 34.4 % — AB (ref 36.0–46.0)
HEMOGLOBIN: 9.9 g/dL — AB (ref 12.0–15.0)
Hemoglobin: 11 g/dL — ABNORMAL LOW (ref 12.0–15.0)
MCH: 27.9 pg (ref 26.0–34.0)
MCH: 27.8 pg (ref 26.0–34.0)
MCHC: 31.8 g/dL (ref 30.0–36.0)
MCHC: 32 g/dL (ref 30.0–36.0)
MCV: 87.6 fL (ref 78.0–100.0)
MCV: 87.1 fL (ref 78.0–100.0)
PLATELETS: 123 10*3/uL — AB (ref 150–400)
Platelets: 164 10*3/uL (ref 150–400)
RBC: 3.55 MIL/uL — AB (ref 3.87–5.11)
RBC: 3.95 MIL/uL (ref 3.87–5.11)
RDW: 15.9 % — ABNORMAL HIGH (ref 11.5–15.5)
RDW: 16.2 % — AB (ref 11.5–15.5)
WBC: 10 10*3/uL (ref 4.0–10.5)
WBC: 6.5 10*3/uL (ref 4.0–10.5)

## 2018-05-08 LAB — POCT I-STAT 3, ART BLOOD GAS (G3+)
Acid-base deficit: 11 mmol/L — ABNORMAL HIGH (ref 0.0–2.0)
BICARBONATE: 15.2 mmol/L — AB (ref 20.0–28.0)
O2 SAT: 91 %
PCO2 ART: 32.1 mmHg (ref 32.0–48.0)
PH ART: 7.283 — AB (ref 7.350–7.450)
PO2 ART: 67 mmHg — AB (ref 83.0–108.0)
Patient temperature: 98
TCO2: 16 mmol/L — AB (ref 22–32)

## 2018-05-08 LAB — BASIC METABOLIC PANEL
ANION GAP: 9 (ref 5–15)
ANION GAP: 12 (ref 5–15)
ANION GAP: 13 (ref 5–15)
BUN: 31 mg/dL — ABNORMAL HIGH (ref 6–20)
BUN: 34 mg/dL — ABNORMAL HIGH (ref 6–20)
BUN: 32 mg/dL — ABNORMAL HIGH (ref 6–20)
CALCIUM: 8 mg/dL — AB (ref 8.9–10.3)
CALCIUM: 7.8 mg/dL — AB (ref 8.9–10.3)
CHLORIDE: 115 mmol/L — AB (ref 101–111)
CHLORIDE: 108 mmol/L (ref 101–111)
CO2: 19 mmol/L — AB (ref 22–32)
CO2: 17 mmol/L — ABNORMAL LOW (ref 22–32)
CO2: 20 mmol/L — AB (ref 22–32)
CREATININE: 3.62 mg/dL — AB (ref 0.44–1.00)
Calcium: 8 mg/dL — ABNORMAL LOW (ref 8.9–10.3)
Chloride: 113 mmol/L — ABNORMAL HIGH (ref 101–111)
Creatinine, Ser: 3.39 mg/dL — ABNORMAL HIGH (ref 0.44–1.00)
Creatinine, Ser: 3.46 mg/dL — ABNORMAL HIGH (ref 0.44–1.00)
GFR calc Af Amer: 14 mL/min — ABNORMAL LOW (ref >60)
GFR calc non Af Amer: 12 mL/min — ABNORMAL LOW (ref >60)
GFR calc non Af Amer: 12 mL/min — ABNORMAL LOW (ref >60)
GFR, EST AFRICAN AMERICAN: 13 mL/min — AB (ref >60)
GFR, EST AFRICAN AMERICAN: 14 mL/min — AB (ref >60)
GFR, EST NON AFRICAN AMERICAN: 11 mL/min — AB (ref >60)
Glucose, Bld: 98 mg/dL (ref 65–99)
Glucose, Bld: 94 mg/dL (ref 65–99)
Glucose, Bld: 92 mg/dL (ref 65–99)
POTASSIUM: 4.7 mmol/L (ref 3.5–5.1)
POTASSIUM: 3.7 mmol/L (ref 3.5–5.1)
Potassium: 4.3 mmol/L (ref 3.5–5.1)
SODIUM: 141 mmol/L (ref 135–145)
Sodium: 143 mmol/L (ref 135–145)
Sodium: 142 mmol/L (ref 135–145)

## 2018-05-08 LAB — LACTIC ACID, PLASMA: LACTIC ACID, VENOUS: 3 mmol/L — AB (ref 0.5–1.9)

## 2018-05-08 LAB — PHOSPHORUS: Phosphorus: 3.4 mg/dL (ref 2.5–4.6)

## 2018-05-08 LAB — GLUCOSE, CAPILLARY: GLUCOSE-CAPILLARY: 96 mg/dL (ref 65–99)

## 2018-05-08 LAB — MAGNESIUM: MAGNESIUM: 1.7 mg/dL (ref 1.7–2.4)

## 2018-05-08 LAB — UREA NITROGEN, URINE: UREA NITROGEN UR: 699 mg/dL

## 2018-05-08 MED ORDER — LACTATED RINGERS IV BOLUS
500.0000 mL | Freq: Once | INTRAVENOUS | Status: AC
  Administered 2018-05-08: 500 mL via INTRAVENOUS

## 2018-05-08 MED ORDER — LACTATED RINGERS IV SOLN
INTRAVENOUS | Status: DC
  Administered 2018-05-08: 12:00:00 via INTRAVENOUS

## 2018-05-08 MED ORDER — LACTATED RINGERS IV BOLUS
2000.0000 mL | Freq: Once | INTRAVENOUS | Status: AC
  Administered 2018-05-08: 2000 mL via INTRAVENOUS

## 2018-05-08 MED ORDER — ACETAMINOPHEN 325 MG PO TABS
650.0000 mg | ORAL_TABLET | Freq: Four times a day (QID) | ORAL | Status: DC | PRN
  Administered 2018-05-10 – 2018-05-11 (×2): 650 mg via ORAL
  Filled 2018-05-08 (×2): qty 2

## 2018-05-08 MED ORDER — ORAL CARE MOUTH RINSE
15.0000 mL | Freq: Two times a day (BID) | OROMUCOSAL | Status: DC
  Administered 2018-05-08 – 2018-05-13 (×7): 15 mL via OROMUCOSAL

## 2018-05-08 MED ORDER — MAGNESIUM SULFATE IN D5W 1-5 GM/100ML-% IV SOLN
1.0000 g | Freq: Once | INTRAVENOUS | Status: AC
  Administered 2018-05-08: 1 g via INTRAVENOUS
  Filled 2018-05-08: qty 100

## 2018-05-08 MED ORDER — LIDOCAINE HCL 1 % IJ SOLN
INTRAMUSCULAR | Status: AC | PRN
  Administered 2018-05-08: 5 mL

## 2018-05-08 MED ORDER — ACETAMINOPHEN 650 MG RE SUPP
650.0000 mg | Freq: Four times a day (QID) | RECTAL | Status: DC | PRN
  Administered 2018-05-08: 650 mg via RECTAL
  Filled 2018-05-08: qty 1

## 2018-05-08 MED ORDER — CIPROFLOXACIN IN D5W 400 MG/200ML IV SOLN
400.0000 mg | INTRAVENOUS | Status: DC
Start: 2018-05-08 — End: 2018-05-09
  Filled 2018-05-08: qty 200

## 2018-05-08 MED ORDER — SODIUM BICARBONATE 8.4 % IV SOLN
INTRAVENOUS | Status: DC
  Administered 2018-05-08 – 2018-05-09 (×2): via INTRAVENOUS
  Filled 2018-05-08 (×4): qty 150

## 2018-05-08 MED ORDER — FENTANYL CITRATE (PF) 100 MCG/2ML IJ SOLN
INTRAMUSCULAR | Status: AC
  Administered 2018-05-09: 25 ug via INTRAVENOUS
  Filled 2018-05-08: qty 4

## 2018-05-08 MED ORDER — CIPROFLOXACIN IN D5W 400 MG/200ML IV SOLN
INTRAVENOUS | Status: AC
  Filled 2018-05-08: qty 200

## 2018-05-08 MED ORDER — FENTANYL CITRATE (PF) 100 MCG/2ML IJ SOLN
INTRAMUSCULAR | Status: AC | PRN
  Administered 2018-05-08 (×2): 50 ug via INTRAVENOUS

## 2018-05-08 MED ORDER — MIDAZOLAM HCL 2 MG/2ML IJ SOLN
INTRAMUSCULAR | Status: AC | PRN
  Administered 2018-05-08 (×2): 1 mg via INTRAVENOUS

## 2018-05-08 MED ORDER — IOPAMIDOL (ISOVUE-300) INJECTION 61%
INTRAVENOUS | Status: AC
  Administered 2018-05-08: 10 mL
  Filled 2018-05-08: qty 50

## 2018-05-08 MED ORDER — LIDOCAINE HCL 1 % IJ SOLN
INTRAMUSCULAR | Status: AC
  Filled 2018-05-08: qty 20

## 2018-05-08 MED ORDER — MIDAZOLAM HCL 2 MG/2ML IJ SOLN
INTRAMUSCULAR | Status: AC
  Filled 2018-05-08: qty 4

## 2018-05-08 MED ORDER — HEPARIN SODIUM (PORCINE) 5000 UNIT/ML IJ SOLN
5000.0000 [IU] | Freq: Three times a day (TID) | INTRAMUSCULAR | Status: DC
  Administered 2018-05-09 – 2018-05-13 (×13): 5000 [IU] via SUBCUTANEOUS
  Filled 2018-05-08 (×14): qty 1

## 2018-05-08 NOTE — Progress Notes (Signed)
Called NP to bedside to assess patient for shivering.  Patient states that she was cold, however no longer and "can't stop shaking"  Remained at bedside, patient shivering began to subside after 20 min.  Encouraged deep breathing and relaxation techniques.  Will continue to monitor.

## 2018-05-08 NOTE — Progress Notes (Signed)
Patient noted to be in resp distress, rr 37, hr 140, sbp 210, confusion noted, sats 87%. Increased 02 to 6lpm [place], neo off at this time, MD notified, abg and stat chest xray ordered, results pending. RN will continue to monitor.

## 2018-05-08 NOTE — Sedation Documentation (Signed)
Patient grimaced with numbing medicine. Sedation medication given

## 2018-05-08 NOTE — Procedures (Signed)
Interventional Radiology Procedure Note  Procedure: Left PCN.  To gravity. Cx sent  Complications: None Recommendations:  - Follow up culture - Stable to ICU - To gravity - Routine drain care   Signed,  Dulcy Fanny. Earleen Newport, DO

## 2018-05-08 NOTE — Consult Note (Addendum)
Chief Complaint: Patient was seen in consultation today for left percutaneous nephrostomy drain placement Chief Complaint  Patient presents with  . Altered Mental Status  . Fall    ???   at the request [place]   Supervising Physician: Corrie Mckusick  Patient Status: [facility] - In-pt  History of Present Illness: Miranda Hutchinson is a 72 y.o. female   Found down at home; confusion General weakness To ED 5/10 US Renal:  Mild right hydronephrosis.  Moderate left hydronephrosis is noted.  Previous 5/8 ED visit for back pain: CT renal stone study: IMPRESSION: 1. Mild left hydronephrosis likely secondary to stricture at the left UPJ. No obstructing stone identified. There is an 8 mm nonobstructing left renal inferior pole calculus. CT urography or direct visualization may provide better evaluation of the nature of obstruction at the level of the UPJ and exclude a urothelial lesion. There is also a mild right hydronephrosis. 2. Extensive sigmoid and colonic diverticulosis. No active inflammatory changes. No bowel obstruction. Normal appendix. 3. Cholelithiasis.  Now with sepsis UTI Creatinine rising Hypotensive Tmax 100 degrees  Request for percutaneous nephrostomy [placement Dr Earleen Newport has discussed with MDs Approves procedure  Past Medical History:  Diagnosis Date  . Aortic insufficiency    a. ascending aortic root aneursym with AI s/p Bentall procedure with bioprosthetic aortic valve 07/2012.  . Ascending aortic aneurysm ([place])    a. s/p Bentall 2013.  . Aspirin intolerance   . Bilateral carotid artery disease ([place])    a.  carotid duplex 2013 showed 40-59% distal RICA and 15-17% mLICA stenosis. b. Duplex 09/2017 - <40% BICA.  Marland Kitchen Coronary artery disease    a. 3 vessel ASCAD s/p CABG (LIMA to LAD, SVG to OM, SVG to PDA) at time of Bentall 07/2012.  Marland Kitchen Dyslipidemia   . Heart murmur   . Hyperlipidemia   . Hyperthyroidism   . Hypothyroidism   . RBBB   . Renal  insufficiency    a. Cr 1.31 in 2016, previously 1 range.    Past Surgical History:  Procedure Laterality Date  . ABDOMINAL HYSTERECTOMY  1990's  . AORTIC VALVE REPLACEMENT  08/10/2012   Procedure: AORTIC VALVE REPLACEMENT (AVR);  Surgeon: Grace Isaac, MD;  Location: [place];  Service: Open Heart Surgery;  Laterality: N/A;  . BREAST EXCISIONAL BIOPSY Left   . CARDIAC CATHETERIZATION  08/04/2012  . CORONARY ARTERY BYPASS GRAFT  08/10/2012   Procedure: CORONARY ARTERY BYPASS GRAFTING (CABG);  Surgeon: Grace Isaac, MD;  Location: [place];  Service: Open Heart Surgery;  Laterality: N/A;  . THORACIC AORTIC ANEURYSM REPAIR  08/10/2012   Procedure: THORACIC ASCENDING ANEURYSM REPAIR (AAA);  Surgeon: Grace Isaac, MD;  Location: [place];  Service: Open Heart Surgery;  Laterality: N/A;  . TONSILLECTOMY     "I was a little child"    Allergies: Percocet [oxycodone-acetaminophen] and Asa [aspirin]  Medications: Prior to Admission medications   Medication Sig Start Date End Date Taking? Authorizing Provider  cycloSPORINE (RESTASIS) 0.05 % ophthalmic emulsion Place 1 drop into both eyes 2 (two) times daily.    Yes [provider]  losartan (COZAAR) 100 MG tablet Take 50 mg by mouth daily.   Yes [provider]  metoprolol tartrate (LOPRESSOR) 25 MG tablet Take 25 mg by mouth 2 (two) times daily.   Yes [provider]  Multiple Vitamins-Minerals (ALIVE WOMENS 50+ PO) Take 1 tablet by mouth daily.   Yes [provider]  rosuvastatin (CRESTOR) 40 MG tablet TAKE 1 TABLET(40 MG) BY MOUTH DAILY 10/19/17  Yes Dunn, Nedra Hai, PA-C     Family History  Problem Relation Age of Onset  . Lung cancer Mother   . Thyroid disease Mother     Social History   Socioeconomic History  . Marital status: Widowed    Spouse name: Not on file  . Number of children: Not on file  . Years of education: Not on file  . Highest education level: Not on file  Occupational History   . Not on file  Social Needs  . Financial resource strain: Not on file  . Food insecurity:    Worry: Not on file    Inability: Not on file  . Transportation needs:    Medical: Not on file    Non-medical: Not on file  Tobacco Use  . Smoking status: Former Smoker    Packs/day: 0.05    Years: 2.00    Pack years: 0.10    Types: Cigarettes    Last attempt to quit: 12/30/1983    Years since quitting: 34.3  . Smokeless tobacco: Never Used  Substance and Sexual Activity  . Alcohol use: No  . Drug use: No  . Sexual activity: Never  Lifestyle  . Physical activity:    Days per week: Not on file    Minutes per session: Not on file  . Stress: Not on file  Relationships  . Social connections:    Talks on phone: Not on file    Gets together: Not on file    Attends religious service: Not on file    Active member of club or organization: Not on file    Attends meetings of clubs or organizations: Not on file    Relationship status: Not on file  Other Topics Concern  . Not on file  Social History Narrative  . Not on file     Review of Systems: A 12 point ROS discussed and pertinent positives are indicated in the HPI above.  All other systems are negative.  Review of Systems  Constitutional: Positive for activity change, fatigue and fever. Negative for appetite change.  Respiratory: Negative for cough and shortness of breath.   Cardiovascular: Negative for chest pain.  Gastrointestinal: Positive for abdominal pain.  Musculoskeletal: Positive for back pain.  Neurological: Positive for weakness.  Psychiatric/Behavioral: Negative for behavioral problems and confusion.    Vital Signs: BP 121/60   Pulse 78   Temp 98.5 F (36.9 C) (Oral)   Resp (!) 26   SpO2 96%   Physical Exam  Constitutional: She is oriented to person, place, and time.  Cardiovascular: Normal rate and regular rhythm.  Pulmonary/Chest: Effort normal and breath sounds normal.  Abdominal: Soft. Bowel sounds are  normal.  Musculoskeletal: Normal range of motion.  Neurological: She is alert and oriented to person, place, and time.  Skin: Skin is warm and dry.  Psychiatric: She has a normal mood and affect. Her behavior is normal. Judgment and thought content normal.  Nursing note and vitals reviewed.   Imaging: Ct Head Wo Contrast  Result Date: 05/07/2018 CLINICAL DATA:  Patient found on the floor at home, incoherent and confused. EXAM: CT HEAD WITHOUT CONTRAST CT CERVICAL SPINE WITHOUT CONTRAST TECHNIQUE: Multidetector CT imaging of the head and cervical spine was performed following the standard protocol without intravenous contrast. Multiplanar CT image reconstructions of the cervical spine were also generated. COMPARISON:  None FINDINGS: CT HEAD FINDINGS Brain: Normal  involutional changes of the brain for age. No large vascular territory infarct, hemorrhage, midline shift or edema. No extra-axial fluid collections. No intra-axial mass. Vascular: No hyperdense vessel sign. Skull: Intact skull without acute nor suspicious osseous abnormality. Sinuses/Orbits: Mild ethmoid sinus mucosal thickening. No acute paranasal sinus disease. Clear mastoids. Intact orbits and globes. Other: None CT CERVICAL SPINE FINDINGS Alignment: Maintained cervical lordosis Skull base and vertebrae: No acute fracture. No aggressive osseous lesions. Probable subchondral cyst of the C4 vertebral body. Soft tissues and spinal canal: No prevertebral fluid or swelling. No visible canal hematoma. Disc levels: Moderate disc flattening at C5-6 with small posterior marginal osteophytes and uncinate spurring. No significant central canal stenosis. Minimal C5-6 neural foraminal encroachment on the left from uncinate spurs. Multilevel mild degenerative facet arthropathy. Upper chest: Negative. Other: None IMPRESSION: 1. Normal head CT for age. 2. Degenerative disc disease C5-6. No acute cervical spine fracture or posttraumatic listhesis.  Electronically Signed   By: Ashley Royalty M.D.   On: 05/07/2018 02:55   Ct Cervical Spine Wo Contrast  Result Date: 05/07/2018 CLINICAL DATA:  Patient found on the floor at home, incoherent and confused. EXAM: CT HEAD WITHOUT CONTRAST CT CERVICAL SPINE WITHOUT CONTRAST TECHNIQUE: Multidetector CT imaging of the head and cervical spine was performed following the standard protocol without intravenous contrast. Multiplanar CT image reconstructions of the cervical spine were also generated. COMPARISON:  None FINDINGS: CT HEAD FINDINGS Brain: Normal involutional changes of the brain for age. No large vascular territory infarct, hemorrhage, midline shift or edema. No extra-axial fluid collections. No intra-axial mass. Vascular: No hyperdense vessel sign. Skull: Intact skull without acute nor suspicious osseous abnormality. Sinuses/Orbits: Mild ethmoid sinus mucosal thickening. No acute paranasal sinus disease. Clear mastoids. Intact orbits and globes. Other: None CT CERVICAL SPINE FINDINGS Alignment: Maintained cervical lordosis Skull base and vertebrae: No acute fracture. No aggressive osseous lesions. Probable subchondral cyst of the C4 vertebral body. Soft tissues and spinal canal: No prevertebral fluid or swelling. No visible canal hematoma. Disc levels: Moderate disc flattening at C5-6 with small posterior marginal osteophytes and uncinate spurring. No significant central canal stenosis. Minimal C5-6 neural foraminal encroachment on the left from uncinate spurs. Multilevel mild degenerative facet arthropathy. Upper chest: Negative. Other: None IMPRESSION: 1. Normal head CT for age. 2. Degenerative disc disease C5-6. No acute cervical spine fracture or posttraumatic listhesis. Electronically Signed   By: Ashley Royalty M.D.   On: 05/07/2018 02:55   US Renal  Result Date: 05/07/2018 CLINICAL DATA:  Acute kidney injury. EXAM: RENAL / URINARY TRACT ULTRASOUND COMPLETE COMPARISON:  CT scan of May 05, 2018. FINDINGS:  Right Kidney: Length: 11.2 cm. Echogenicity within normal limits. No mass visualized. Mild right hydronephrosis is noted. Left Kidney: Length: 13.5 cm. Echogenicity within normal limits. No mass visualized. Moderate hydronephrosis is noted. Bladder: Appears normal for degree of bladder distention. Right ureteral jet is visualized. Left ureteral jet is not visualized. IMPRESSION: Mild right hydronephrosis.  Moderate left hydronephrosis is noted. Electronically Signed   By: Marijo Conception, M.D.   On: 05/07/2018 07:31   Dg Chest Port 1 View  Result Date: 05/07/2018 CLINICAL DATA:  72 year old female with fever and sepsis. EXAM: PORTABLE CHEST 1 VIEW COMPARISON:  Chest radiograph dated 09/06/2012 FINDINGS: Minimal left lung base atelectasis. Infiltrate is less likely. There is no focal consolidation, pleural effusion, or pneumothorax. Mild cardiomegaly. Median sternotomy wires and mechanical cardiac valve. Atherosclerotic calcification of the aortic arch. No acute osseous pathology. IMPRESSION: Left lung  base atelectatic changes. Infiltrate is not excluded. Clinical correlation is recommended. No focal consolidation. Electronically Signed   By: Anner Crete M.D.   On: 05/07/2018 02:47   Ct Renal Stone Study  Result Date: 05/05/2018 CLINICAL DATA:  72 year old female with left flank pain. Concern for stone. EXAM: CT ABDOMEN AND PELVIS WITHOUT CONTRAST TECHNIQUE: Multidetector CT imaging of the abdomen and pelvis was performed following the standard protocol without IV contrast. COMPARISON:  None. FINDINGS: Evaluation of this exam is limited in the absence of intravenous contrast. Lower chest: Minimal bibasilar atelectatic changes. The visualized lung bases are otherwise clear. There is coronary vascular calcification and mechanical aortic valve. No intra-abdominal free air or free fluid. Hepatobiliary: The liver is unremarkable. There is layering stone within the gallbladder. No pericholecystic fluid. No  intrahepatic biliary ductal dilatation. Pancreas: Unremarkable. No pancreatic ductal dilatation or surrounding inflammatory changes. Spleen: Normal in size without focal abnormality. Adrenals/Urinary Tract: The adrenal glands are unremarkable. There is a nonobstructing stone or 2 adjacent stones with combined diameter of 8 mm in the inferior pole of the left kidney. There is mild left hydronephrosis with transition at the ureteropelvic junction. No obstructing stone identified. A UPJ stricture or urothelial lesion is not entirely excluded. CT urogram may provide better evaluation. There is mild fullness of the right renal collecting system. No stone identified. A 3 mm calcific focus along the course of the distal right ureter (series 2, image 82) may represent a focus of vascular calcification. The urinary bladder is grossly unremarkable for the degree of distention. Stomach/Bowel: There is a small hiatal hernia. There is extensive sigmoid diverticulosis with muscular hypertrophy. No active inflammatory changes. The appendix is normal. Vascular/Lymphatic: Moderate aortoiliac atherosclerotic disease. The aorta is ectatic measuring up to 2.4 cm. There is 2.1 cm aneurysmal dilatation of the right common iliac artery. No portal venous gas. There is no adenopathy. Reproductive: Hysterectomy.  No pelvic mass. Other: None Musculoskeletal: Degenerative changes of the spine. No acute osseous pathology. IMPRESSION: 1. Mild left hydronephrosis likely secondary to stricture at the left UPJ. No obstructing stone identified. There is an 8 mm nonobstructing left renal inferior pole calculus. CT urography or direct visualization may provide better evaluation of the nature of obstruction at the level of the UPJ and exclude a urothelial lesion. There is also a mild right hydronephrosis. 2. Extensive sigmoid and colonic diverticulosis. No active inflammatory changes. No bowel obstruction. Normal appendix. 3. Cholelithiasis.  Electronically Signed   By: Anner Crete M.D.   On: 05/05/2018 03:38    Labs:  CBC: Recent Labs    04/14/18 1714 05/05/18 0201 05/07/18 0253 05/08/18 0253  WBC 13.6* 10.7* 11.1* 10.0  HGB 12.5 11.5* 10.9* 9.9*  HCT 38.2 36.2 34.0* 31.1*  PLT 260 230 177 164    COAGS: Recent Labs    05/07/18 0601  INR 1.32  APTT 36    BMP: Recent Labs    05/05/18 0201 05/07/18 0253 05/08/18 0253 05/08/18 0932  NA 141 135 143 142  K 4.5 4.3 4.7 4.3  CL 110 104 115* 113*  CO2 23 22 19* 17*  GLUCOSE 135* 150* 98 94  BUN 22* 23* 31* 34*  CALCIUM 9.5 8.9 8.0* 7.8*  CREATININE 1.59* 2.58* 3.39* 3.62*  GFRNONAA 31* 17* 12* 11*  GFRAA 36* 20* 14* 13*    LIVER FUNCTION TESTS: Recent Labs    10/06/17 1400 04/14/18 1714 05/05/18 0201 05/07/18 0253  BILITOT 0.3 0.8 0.5 1.3*  AST 15 18  19 22  ALT 14 14 17  13*  ALKPHOS 73 70 58 65  PROT 6.7 8.4* 7.2 6.5  ALBUMIN 4.2 3.6 3.6 2.8*    TUMOR MARKERS: No results for input(s): AFPTM, CEA, CA199, CHROMGRNA in the last 8760 hours.  Assessment and Plan:  Left hydronephrosis UTI/sepsis Febrile; hypotensive Scheduled for L Percutaneous nephrostomy placement Risks and benefits of L PCN were discussed with the patient including, but not limited to, infection, bleeding, significant bleeding causing loss or decrease in renal function or damage to adjacent structures.   All of the patient's questions were answered, patient is agreeable to proceed.  Consent signed and in chart.  Thank you for this interesting consult.  I greatly enjoyed meeting Miranda Hutchinson and look forward to participating in their care.  A copy of this report was sent to the requesting provider on this date.  Electronically Signed: Lavonia Drafts, PA-C 05/08/2018, 1:09 PM   I spent a total of 40 Minutes    in face to face in clinical consultation, greater than 50% of which was counseling/coordinating care for L PCN

## 2018-05-08 NOTE — Consult Note (Signed)
H&P Physician requesting consult: Brand Males, MD  Chief Complaint: Left hydronephrosis, sepsis  History of Present Illness: 72 year old female found down on the floor at home with confusion.  She has had generalized weakness.  Is also had decreased appetite and dysuria.  CT renal stone protocol showed mild left hydronephrosis to the level of the UPJ consistent with possible left ureteropelvic junction obstruction.  No obstructing calculus was identified.  The scan was performed on 05/05/2018.  Renal ultrasound yesterday showed moderate hydronephrosis.  Creatinine was 1.59 on presentation.  This has worsened to 3.6.  She is also been febrile.  She is now having hypotension with worsening pressor requirement.  She is still protecting her airway.  She appears moderately confused.  She is not on any blood thinners except for heparin DVT prophylaxis which she last had at 5 AM.  Past Medical History:  Diagnosis Date  . Aortic insufficiency    a. ascending aortic root aneursym with AI s/p Bentall procedure with bioprosthetic aortic valve 07/2012.  . Ascending aortic aneurysm ([place])    a. s/p Bentall 2013.  . Aspirin intolerance   . Bilateral carotid artery disease ([place])    a.  carotid duplex 2013 showed 40-59% distal RICA and 57-32% mLICA stenosis. b. Duplex 09/2017 - <40% BICA.  Marland Kitchen Coronary artery disease    a. 3 vessel ASCAD s/p CABG (LIMA to LAD, SVG to OM, SVG to PDA) at time of Bentall 07/2012.  Marland Kitchen Dyslipidemia   . Heart murmur   . Hyperlipidemia   . Hyperthyroidism   . Hypothyroidism   . RBBB   . Renal insufficiency    a. Cr 1.31 in 2016, previously 1 range.   Past Surgical History:  Procedure Laterality Date  . ABDOMINAL HYSTERECTOMY  1990's  . AORTIC VALVE REPLACEMENT  08/10/2012   Procedure: AORTIC VALVE REPLACEMENT (AVR);  Surgeon: Grace Isaac, MD;  Location: [place];  Service: Open Heart Surgery;  Laterality: N/A;  . BREAST EXCISIONAL BIOPSY Left   . CARDIAC CATHETERIZATION   08/04/2012  . CORONARY ARTERY BYPASS GRAFT  08/10/2012   Procedure: CORONARY ARTERY BYPASS GRAFTING (CABG);  Surgeon: Grace Isaac, MD;  Location: [place];  Service: Open Heart Surgery;  Laterality: N/A;  . THORACIC AORTIC ANEURYSM REPAIR  08/10/2012   Procedure: THORACIC ASCENDING ANEURYSM REPAIR (AAA);  Surgeon: Grace Isaac, MD;  Location: [place];  Service: Open Heart Surgery;  Laterality: N/A;  . TONSILLECTOMY     "I was a little child"    Home Medications:  Medications Prior to Admission  Medication Sig Dispense Refill Last Dose  . cycloSPORINE (RESTASIS) 0.05 % ophthalmic emulsion Place 1 drop into both eyes 2 (two) times daily.    05/06/2018 at Unknown time  . losartan (COZAAR) 100 MG tablet Take 50 mg by mouth daily.   05/06/2018 at Unknown time  . metoprolol tartrate (LOPRESSOR) 25 MG tablet Take 25 mg by mouth 2 (two) times daily.   05/06/2018 at 2000  . Multiple Vitamins-Minerals (ALIVE WOMENS 50+ PO) Take 1 tablet by mouth daily.   05/06/2018 at Unknown time  . rosuvastatin (CRESTOR) 40 MG tablet TAKE 1 TABLET(40 MG) BY MOUTH DAILY 90 tablet 3 05/06/2018 at Unknown time   Allergies:  Allergies  Allergen Reactions  . Percocet [Oxycodone-Acetaminophen] Other (See Comments)    Hallucinations   . Asa [Aspirin] Nausea Only and Other (See Comments)    "hurts my stomach"    Family History  Problem Relation Age of  Onset  . Lung cancer Mother   . Thyroid disease Mother    Social History:  reports that she quit smoking about 34 years ago. Her smoking use included cigarettes. She has a 0.10 pack-year smoking history. She has never used smokeless tobacco. She reports that she does not drink alcohol or use drugs.  ROS: A complete review of systems was performed.  All systems are negative except for pertinent findings as noted. ROS   Physical Exam:  Vital signs in last 24 hours: Temp:  [98.2 F (36.8 C)-100.3 F (37.9 C)] 99.6 F (37.6 C) (05/11 0800) Pulse Rate:  [78-122] 81  (05/11 1045) Resp:  [0-43] 24 (05/11 1045) BP: (83-142)/(38-128) 121/57 (05/11 1045) SpO2:  [90 %-99 %] 96 % (05/11 1045) General: Confused but in no acute distress HEENT: Normocephalic, atraumatic Cardiovascular: Tachycardic, normotensive on 80 of Neo-Synephrine Lungs: Slightly tachypneic but protecting her airway Abdomen: Soft, mildly tender in left lower quadrant, nondistended, no abdominal masses Back: No CVA tenderness Extremities: No edema Neurologic: Grossly intact  Laboratory Data:  Results for orders placed or performed during the hospital encounter of 05/07/18 (from the past 24 hour(s))  Basic metabolic panel     Status: Abnormal   Collection Time: 05/08/18  2:53 AM  Result Value Ref Range   Sodium 143 135 - 145 mmol/L   Potassium 4.7 3.5 - 5.1 mmol/L   Chloride 115 (H) 101 - 111 mmol/L   CO2 19 (L) 22 - 32 mmol/L   Glucose, Bld 98 65 - 99 mg/dL   BUN 31 (H) 6 - 20 mg/dL   Creatinine, Ser 3.39 (H) 0.44 - 1.00 mg/dL   Calcium 8.0 (L) 8.9 - 10.3 mg/dL   GFR calc non Af Amer 12 (L) >60 mL/min   GFR calc Af Amer 14 (L) >60 mL/min   Anion gap 9 5 - 15  CBC     Status: Abnormal   Collection Time: 05/08/18  2:53 AM  Result Value Ref Range   WBC 10.0 4.0 - 10.5 K/uL   RBC 3.55 (L) 3.87 - 5.11 MIL/uL   Hemoglobin 9.9 (L) 12.0 - 15.0 g/dL   HCT 31.1 (L) 36.0 - 46.0 %   MCV 87.6 78.0 - 100.0 fL   MCH 27.9 26.0 - 34.0 pg   MCHC 31.8 30.0 - 36.0 g/dL   RDW 15.9 (H) 11.5 - 15.5 %   Platelets 164 150 - 400 K/uL  Magnesium     Status: None   Collection Time: 05/08/18  2:53 AM  Result Value Ref Range   Magnesium 1.7 1.7 - 2.4 mg/dL  Phosphorus     Status: None   Collection Time: 05/08/18  2:53 AM  Result Value Ref Range   Phosphorus 3.4 2.5 - 4.6 mg/dL  Glucose, capillary     Status: None   Collection Time: 05/08/18  9:00 AM  Result Value Ref Range   Glucose-Capillary 96 65 - 99 mg/dL   Comment 1 Capillary Specimen   Basic metabolic panel     Status: Abnormal    Collection Time: 05/08/18  9:32 AM  Result Value Ref Range   Sodium 142 135 - 145 mmol/L   Potassium 4.3 3.5 - 5.1 mmol/L   Chloride 113 (H) 101 - 111 mmol/L   CO2 17 (L) 22 - 32 mmol/L   Glucose, Bld 94 65 - 99 mg/dL   BUN 34 (H) 6 - 20 mg/dL   Creatinine, Ser 3.62 (H) 0.44 - 1.00 mg/dL  Calcium 7.8 (L) 8.9 - 10.3 mg/dL   GFR calc non Af Amer 11 (L) >60 mL/min   GFR calc Af Amer 13 (L) >60 mL/min   Anion gap 12 5 - 15   Recent Results (from the past 240 hour(s))  Blood Culture (routine x 2)     Status: None (Preliminary result)   Collection Time: 05/07/18  2:45 AM  Result Value Ref Range Status   Specimen Description BLOOD RIGHT ANTECUBITAL  Final   Special Requests   Final    BOTTLES DRAWN AEROBIC AND ANAEROBIC Blood Culture results may not be optimal due to an excessive volume of blood received in culture bottles   Culture   Final    NO GROWTH 1 DAY Performed at [facility], [place] [address], [place], [place] [address]    Report Status PENDING  Incomplete  Blood Culture (routine x 2)     Status: None (Preliminary result)   Collection Time: 05/07/18  3:10 AM  Result Value Ref Range Status   Specimen Description BLOOD RIGHT FOREARM  Final   Special Requests   Final    BOTTLES DRAWN AEROBIC AND ANAEROBIC Blood Culture adequate volume   Culture   Final    NO GROWTH 1 DAY Performed at [facility], [place] [address] [place], [place] [address]    Report Status PENDING  Incomplete  Urine culture     Status: Abnormal (Preliminary result)   Collection Time: 05/07/18  4:50 AM  Result Value Ref Range Status   Specimen Description URINE, CATHETERIZED  Final   Special Requests NONE  Final   Culture (A)  Final    >=100,000 COLONIES/mL ESCHERICHIA COLI SUSCEPTIBILITIES TO FOLLOW Performed at [facility], [place] [address], [place], [place] [address]    Report Status PENDING  Incomplete  MRSA PCR Screening     Status: None   Collection Time: 05/07/18  9:49 AM   Result Value Ref Range Status   MRSA by PCR NEGATIVE NEGATIVE Final    Comment:        The GeneXpert MRSA Assay (FDA approved for NASAL specimens only), is one component of a comprehensive MRSA colonization surveillance program. It is not intended to diagnose MRSA infection nor to guide or monitor treatment for MRSA infections. Performed at [facility], [place] [address], [place], [place] [address]    Creatinine: Recent Labs    05/05/18 0201 05/07/18 0253 05/08/18 0253 05/08/18 0932  CREATININE 1.59* 2.58* 3.39* 3.62*    Impression/Assessment:  Left ureteropelvic junction obstruction Acute renal insufficiency Urinary tract infection with concern for severe sepsis secondary to obstruction and UTI, concern for risk of rapid clinical decompensation  Plan:  I spoke with interventional radiology.  I recommend left nephrostomy tube placement as soon as possible.  This has been ordered.  It will be done as soon as possible today.  Appreciate the assistance of interventional radiology.  Agree with broad spectrum antibiotics until sensitivities return.  Currently showing E. coli in the urine.  Continue pressure support as necessary and ICU care.  Marton Redwood, III 05/08/2018, 11:37 AM

## 2018-05-08 NOTE — Progress Notes (Signed)
Recent Labs  Lab 05/05/18 0201 05/07/18 0253 05/08/18 0253 05/08/18 0932  CREATININE 1.59* 2.58* 3.39* 3.62*    Rising creat despite fluids IR s/p nephrostomy Still on neo  Plan Repeat fluid bolus Recheck bmet 9pm'c Continue neo  Grandson updated earlier  Dr. Brand Males, M.D., [facility].C.P Pulmonary and Critical Care Medicine Staff Physician, [place] Director - Interstitial Lung Disease  Program  Pulmonary [place] [facility] [place], [place], [address]  Pager: [phone], If no answer or between  15:00h - 7:00h: call [phone] Telephone: [phone]

## 2018-05-08 NOTE — Progress Notes (Signed)
PULMONARY / CRITICAL CARE MEDICINE   Name: Miranda Hutchinson MRN: [id number] DOB: 12/10/1946    ADMISSION DATE:  05/07/2018 CONSULTATION DATE:  05/07/18  REFERRING MD:  Myna Hidalgo  CHIEF COMPLAINT:  hypotension  BRIEF Miranda Hutchinson is a 72 y.o. female with PMH as outlined below. She was brought to [facility] ED 05/06/18 after being found down on the floor at home with confusion.  Pt does not remember falling but states that she did not lose consciousness.  She states she hasn't felt well for the past few days with generalized weakness.  Denies any fevers/chills/sweats, headaches, chest pain, SOB, N/V/D, abd pain, myalgias.  No exposures to known sick contacts.  Has not had very good appetites for 3 days.  She has also had dysuria.  She had CT head and Cspine that was negative.  CXR showed left basilar atelectasis.  She had CT stone study that showed mild hydro with possible left UPJ stricture; however, no obstructing stone noted.  She was also found to have hypotension. She was given 4L IVF; however, MAPs remained in mid 50's.  PCCM was subsequently consulted for consideration transfer to ICU.   PAST MEDICAL HISTORY :  She  has a past medical history of Aortic insufficiency, Ascending aortic aneurysm ([facility]), Aspirin intolerance, Bilateral carotid artery disease ([place]), Coronary artery disease, Dyslipidemia, Heart murmur, Hyperlipidemia, Hyperthyroidism, Hypothyroidism, RBBB, and Renal insufficiency.  PAST SURGICAL HISTORY: She  has a past surgical history that includes Tonsillectomy; Abdominal hysterectomy (1990's); Cardiac catheterization (08/04/2012); Coronary artery bypass graft (08/10/2012); Thoracic aortic aneurysm repair (08/10/2012); Aortic valve replacement (08/10/2012); and Breast excisional biopsy (Left).     EVENTS 05/07/2018  - admit,  CT renal stone 5/8 > mild left hydro, stricture at left UPJ.  61mm nonobstructing left renal pole calculus. Extensive diverticulosis. CT head 5/10 > negative. US  renal 5/10 >  Moderate left hydro Blood 5/10 >  Urine 5/10 >  Vanc 5/10 >  Merrem5/10 >  .  SUBJECTIVE/OVERNIGHT/INTERVAL HX 05/08/18  - remains on neo. Worsening AKI/now in ATN range. Urine looks concentrated. Not on vent. Not confused per RN  VITAL SIGNS: BP (!) 129/92   Pulse (!) 110   Temp 100.3 F (37.9 C) (Oral)   Resp (!) 40   SpO2 93%   HEMODYNAMICS:    VENTILATOR SETTINGS:    INTAKE / OUTPUT: I/O last 3 completed shifts: In: 4303.5 [I.V.:1053.5; IV Piggyback:3250] Out: 815 [Urine:815]   PHYSICAL EXAMINATION:  General Appearance:    Calm. Resting. No distress. Obese  Head:    Normocephalic, without obvious abnormality, atraumatic  Eyes:    PERRL - yes, conjunctiva/corneas - clear      Ears:    Normal external ear canals, both ears  Nose:   NG tube - no but has [place] and is 93%  Throat:  ETT TUBE - no , OG tube - no  Neck:   Supple,  No enlargement/tenderness/nodules     Lungs:     Clear to auscultation bilaterally  Chest wall:    No deformity  Heart:    S1 and S2 normal, no murmur, CVP - no.  Pressors - yes on neo 44mcg and sbp 90s  Abdomen:     Soft, no masses, no organomegaly  Genitalia:    Not done  Rectal:   not done  Extremities:   Extremities- intact     Skin:   Intact in exposed areas .      Neurologic:   Sedation -  none -> RASS - 0 . Moves all 4s - yes. CAM-ICU - neg . Orientation - x3+     PULMONARY No results for input(s): PHART, PCO2ART, PO2ART, HCO3, TCO2, O2SAT in the last 168 hours.  Invalid input(s): PCO2, PO2  CBC Recent Labs  Lab 05/05/18 0201 05/07/18 0253 05/08/18 0253  HGB 11.5* 10.9* 9.9*  HCT 36.2 34.0* 31.1*  WBC 10.7* 11.1* 10.0  PLT 230 177 164    COAGULATION Recent Labs  Lab 05/07/18 0601  INR 1.32    CARDIAC  No results for input(s): TROPONINI in the last 168 hours. No results for input(s): PROBNP in the last 168 hours.   CHEMISTRY Recent Labs  Lab 05/05/18 0201 05/07/18 0253 05/08/18 0253  NA 141  135 143  K 4.5 4.3 4.7  CL 110 104 115*  CO2 23 22 19*  GLUCOSE 135* 150* 98  BUN 22* 23* 31*  CREATININE 1.59* 2.58* 3.39*  CALCIUM 9.5 8.9 8.0*  MG  --   --  1.7  PHOS  --   --  3.4   Estimated Creatinine Clearance: 17.1 mL/min (A) (by C-G formula based on SCr of 3.39 mg/dL (H)).   LIVER Recent Labs  Lab 05/05/18 0201 05/07/18 0253 05/07/18 0601  AST 19 22  --   ALT 17 13*  --   ALKPHOS 58 65  --   BILITOT 0.5 1.3*  --   PROT 7.2 6.5  --   ALBUMIN 3.6 2.8*  --   INR  --   --  1.32     INFECTIOUS Recent Labs  Lab 05/07/18 0304 05/07/18 0447  LATICACIDVEN 0.98 0.93     ENDOCRINE CBG (last 3)  Recent Labs    05/07/18 0211 05/07/18 0952  GLUCAP 146* 100*         IMAGING x48h  - image(s) personally visualized  -   highlighted in bold Ct Head Wo Contrast  Result Date: 05/07/2018 CLINICAL DATA:  Patient found on the floor at home, incoherent and confused. EXAM: CT HEAD WITHOUT CONTRAST CT CERVICAL SPINE WITHOUT CONTRAST TECHNIQUE: Multidetector CT imaging of the head and cervical spine was performed following the standard protocol without intravenous contrast. Multiplanar CT image reconstructions of the cervical spine were also generated. COMPARISON:  None FINDINGS: CT HEAD FINDINGS Brain: Normal involutional changes of the brain for age. No large vascular territory infarct, hemorrhage, midline shift or edema. No extra-axial fluid collections. No intra-axial mass. Vascular: No hyperdense vessel sign. Skull: Intact skull without acute nor suspicious osseous abnormality. Sinuses/Orbits: Mild ethmoid sinus mucosal thickening. No acute paranasal sinus disease. Clear mastoids. Intact orbits and globes. Other: None CT CERVICAL SPINE FINDINGS Alignment: Maintained cervical lordosis Skull base and vertebrae: No acute fracture. No aggressive osseous lesions. Probable subchondral cyst of the C4 vertebral body. Soft tissues and spinal canal: No prevertebral fluid or swelling.  No visible canal hematoma. Disc levels: Moderate disc flattening at C5-6 with small posterior marginal osteophytes and uncinate spurring. No significant central canal stenosis. Minimal C5-6 neural foraminal encroachment on the left from uncinate spurs. Multilevel mild degenerative facet arthropathy. Upper chest: Negative. Other: None IMPRESSION: 1. Normal head CT for age. 2. Degenerative disc disease C5-6. No acute cervical spine fracture or posttraumatic listhesis. Electronically Signed   By: Ashley Royalty M.D.   On: 05/07/2018 02:55   Ct Cervical Spine Wo Contrast  Result Date: 05/07/2018 CLINICAL DATA:  Patient found on the floor at home, incoherent and confused. EXAM: CT HEAD WITHOUT  CONTRAST CT CERVICAL SPINE WITHOUT CONTRAST TECHNIQUE: Multidetector CT imaging of the head and cervical spine was performed following the standard protocol without intravenous contrast. Multiplanar CT image reconstructions of the cervical spine were also generated. COMPARISON:  None FINDINGS: CT HEAD FINDINGS Brain: Normal involutional changes of the brain for age. No large vascular territory infarct, hemorrhage, midline shift or edema. No extra-axial fluid collections. No intra-axial mass. Vascular: No hyperdense vessel sign. Skull: Intact skull without acute nor suspicious osseous abnormality. Sinuses/Orbits: Mild ethmoid sinus mucosal thickening. No acute paranasal sinus disease. Clear mastoids. Intact orbits and globes. Other: None CT CERVICAL SPINE FINDINGS Alignment: Maintained cervical lordosis Skull base and vertebrae: No acute fracture. No aggressive osseous lesions. Probable subchondral cyst of the C4 vertebral body. Soft tissues and spinal canal: No prevertebral fluid or swelling. No visible canal hematoma. Disc levels: Moderate disc flattening at C5-6 with small posterior marginal osteophytes and uncinate spurring. No significant central canal stenosis. Minimal C5-6 neural foraminal encroachment on the left from  uncinate spurs. Multilevel mild degenerative facet arthropathy. Upper chest: Negative. Other: None IMPRESSION: 1. Normal head CT for age. 2. Degenerative disc disease C5-6. No acute cervical spine fracture or posttraumatic listhesis. Electronically Signed   By: Ashley Royalty M.D.   On: 05/07/2018 02:55   US Renal  Result Date: 05/07/2018 CLINICAL DATA:  Acute kidney injury. EXAM: RENAL / URINARY TRACT ULTRASOUND COMPLETE COMPARISON:  CT scan of May 05, 2018. FINDINGS: Right Kidney: Length: 11.2 cm. Echogenicity within normal limits. No mass visualized. Mild right hydronephrosis is noted. Left Kidney: Length: 13.5 cm. Echogenicity within normal limits. No mass visualized. Moderate hydronephrosis is noted. Bladder: Appears normal for degree of bladder distention. Right ureteral jet is visualized. Left ureteral jet is not visualized. IMPRESSION: Mild right hydronephrosis.  Moderate left hydronephrosis is noted. Electronically Signed   By: Marijo Conception, M.D.   On: 05/07/2018 07:31   Dg Chest Port 1 View  Result Date: 05/07/2018 CLINICAL DATA:  72 year old female with fever and sepsis. EXAM: PORTABLE CHEST 1 VIEW COMPARISON:  Chest radiograph dated 09/06/2012 FINDINGS: Minimal left lung base atelectasis. Infiltrate is less likely. There is no focal consolidation, pleural effusion, or pneumothorax. Mild cardiomegaly. Median sternotomy wires and mechanical cardiac valve. Atherosclerotic calcification of the aortic arch. No acute osseous pathology. IMPRESSION: Left lung base atelectatic changes. Infiltrate is not excluded. Clinical correlation is recommended. No focal consolidation. Electronically Signed   By: Anner Crete M.D.   On: 05/07/2018 02:47    DISCUSSION: 72 y.o. female admitted 5/10 after being found down on floor at home. Found to have sepsis, likely due to UTI.  ASSESSMENT / PLAN:  PULMONARY A: NIl acute 05/08/18 P:   Monitorr Pulm toilet  CARDIOVASCULAR A:  Hx CAD, HTN, RBB, HLD,  AAA (s/p bentall 2013).  Shock - combo septic from UTI + hypovolemic from decreased PO intake.     On 05/08/2018 - circulatory shock remains with neo need at 3mcg. Urine concentrated. Likely still dry  P:  2L LR bolus now and then LR at 100cc/h Neosynephrine as needed for goal MAP > 65. Might need CVL. Continue preadmission crestor. Hold preadmission lopressor, losartan.  RENAL A:   AKI  05/08/18 - worsening AKI. Urine looks concerntrated. ? Sepsis/volume related v hydro related (moderate left hydro due to stone)  P:   Fluids per above (change saline to LR) Recheck bmet 13.00h Urology called DC vanc  GASTROINTESTINAL A:   Nutrition. P:   NPO except meds  HEMATOLOGIC A:   VTE Prophylaxis. P:  SCD's / heparin. CBC in AM.  INFECTIOUS Recent Labs  Lab 05/07/18 0304 05/07/18 0447  LATICACIDVEN 0.98 0.93    A:   Likely due to stone  P:   Abx as above (vanc / merrem).  Follow cultures as above.  ENDOCRINE A:   Hx hypothyroidism - not on meds.  Results for Kihara, KAYLENN CIVIL (MRN [id number]) as of 05/08/2018 08:57  Ref. Range 01/28/2018 15:39 05/07/2018 09:53  TSH Latest Ref Range: 0.350 - 4.500 uIU/mL 0.421 (L) 0.229 (L)   P:   monitor  NEUROLOGIC A:   Deconditioned but neuro intact  P:   Supportive care.  Family updated: None present 05/08/18   Interdisciplinary Family Meeting v Palliative Care Meeting:  Due by: 05/14/18.    The patient is critically ill with multiple organ systems failure and requires high complexity decision making for assessment and support, frequent evaluation and titration of therapies, application of advanced monitoring technologies and extensive interpretation of multiple databases.   Critical Care Time devoted to patient care services described in this note is  30  Minutes. This time reflects time of care of this signee Dr Brand Males. This critical care time does not reflect procedure time, or teaching time or  supervisory time of PA/NP/Med student/Med Resident etc but could involve care discussion time    Dr. Brand Males, M.D., [facility].C.P Pulmonary and Critical Care Medicine Staff Physician [place] [facility] Pager: [phone], If no answer or between  15:00h - 7:00h: call [phone]  05/08/2018 8:38 AM

## 2018-05-08 NOTE — Progress Notes (Signed)
Post instrumentatiib some mild increase in respdistres and confusion Needing prn opioid as well  On exam 4:53 PM 05/08/2018  Sleepy arousable Mildly confused   ABG Recent Labs  Lab 05/08/18 1623  PHART 7.283*  PCO2ART 32.1  PO2ART 67.0*  HCO3 15.2*  TCO2 16*  O2SAT 91.0    Dg Chest Port 1 View  Result Date: 05/08/2018 CLINICAL DATA:  72 year old female with altered mental status, left kidney obstruction status post percutaneous nephrostomy. Respiratory compromise. EXAM: PORTABLE CHEST 1 VIEW COMPARISON:  05/07/2018 chest radiographs and earlier. FINDINGS: Portable AP semi upright view at 1619 hours. Lower lung volumes with increasing left lung base opacity. Stable cardiomegaly and mediastinal contours. Prior CABG and cardiac valve replacement. Increased pulmonary vascularity, but no overt edema. No pneumothorax or pleural effusion identified. Paucity of bowel gas in the upper abdomen. IMPRESSION: 1. Lower lung volumes with decreasing ventilation at the left lung base, favor atelectasis. 2. Increased pulmonary vascular congestion but no overt pulmonary edema at this time. Electronically Signed   By: Genevie Ann M.D.   On: 05/08/2018 16:40   Ir Nephrostomy Placement Left  Result Date: 05/08/2018 INDICATION: 72 year old female with a history of sepsis and left ureteral obstruction. EXAM: IR NEPHROSTOMY PLACEMENT LEFT COMPARISON:  None. MEDICATIONS: None ANESTHESIA/SEDATION: Fentanyl 100 mcg IV; Versed 2.0 mg IV Moderate Sedation Time:  14 minutes The patient was continuously monitored during the procedure by the interventional radiology nurse under my direct supervision. CONTRAST:  10 cc-administered into the collecting system(s) FLUOROSCOPY TIME:  Fluoroscopy Time: 0 minutes 36 seconds (3.8 mGy). COMPLICATIONS: None immediate. PROCEDURE: Informed written consent was obtained from the patient after a thorough discussion of the procedural risks, benefits and alternatives. All questions were  addressed. Maximal Sterile Barrier Technique was utilized including caps, mask, sterile gowns, sterile gloves, sterile drape, hand hygiene and skin antiseptic. A timeout was performed prior to the initiation of the procedure. Patient positioned prone position on the fluoroscopy table. Ultrasound survey of the left flank was performed with images stored and sent to PACs. The patient was then prepped and draped in the usual sterile fashion. 1% lidocaine was used to anesthetize the skin and subcutaneous tissues for local anesthesia. A Chiba needle was then used to access a posterior inferior calyx with ultrasound guidance. With spontaneous urine returned through the needle, passage of an 018 micro wire into the collecting system was performed under fluoroscopy. A small incision was made with an 11 blade scalpel, and the needle was removed from the wire. An Accustick system was then advanced over the wire into the collecting system under fluoroscopy. The metal stiffener and inner dilator were removed, and then a sample of fluid was aspirated through the 4 French outer sheath. Bentson wire was passed into the collecting system and the sheath removed. Ten French dilation of the soft tissues was performed. Using modified Seldinger technique, a 10 French pigtail catheter drain was placed over the Bentson wire. Wire and inner stiffener removed, and the pigtail was formed in the collecting system. Small amount of contrast confirmed position of the catheter. Patient tolerated the procedure well and remained hemodynamically stable throughout. No complications were encountered and no significant blood loss encountered IMPRESSION: Status post left percutaneous nephrostomy. Signed, Dulcy Fanny. Earleen Newport, DO [facility] Electronically Signed   By: Corrie Mckusick D.O.   On: 05/08/2018 15:14    A Acidotic Might be heading into volume overload v ALI  P Bicarb gtt Monitor  resp  status BiPAP if needed Intubaet if worse  Daugher Ceasar Mons from [facility] updated   Dr. Brand Males, M.D., [facility].C.P Pulmonary and Critical Care Medicine Staff Physician, [place] Director - Interstitial Lung Disease  Program  Pulmonary [place] at [place], [place], [address]  Pager: [phone], If no answer or between  15:00h - 7:00h: call [phone] Telephone: [phone]

## 2018-05-08 NOTE — Sedation Documentation (Signed)
Patient is resting comfortably. 

## 2018-05-08 NOTE — Progress Notes (Signed)
RT called to bedside by RN for stat ABG, pt lethargic post procedure. ABG results given to RN

## 2018-05-08 NOTE — Plan of Care (Signed)
Patient remains on Neo for BP, currently NPO.  Patient encouraged to move in bed, up to chair position in bed.

## 2018-05-08 NOTE — Sedation Documentation (Signed)
Vital signs stable. 

## 2018-05-09 LAB — PHOSPHORUS: PHOSPHORUS: 3.5 mg/dL (ref 2.5–4.6)

## 2018-05-09 LAB — BASIC METABOLIC PANEL
Anion gap: 9 (ref 5–15)
BUN: 32 mg/dL — ABNORMAL HIGH (ref 6–20)
CHLORIDE: 111 mmol/L (ref 101–111)
CO2: 22 mmol/L (ref 22–32)
Calcium: 7.8 mg/dL — ABNORMAL LOW (ref 8.9–10.3)
Creatinine, Ser: 3.52 mg/dL — ABNORMAL HIGH (ref 0.44–1.00)
GFR calc non Af Amer: 12 mL/min — ABNORMAL LOW (ref >60)
GFR, EST AFRICAN AMERICAN: 14 mL/min — AB (ref >60)
Glucose, Bld: 135 mg/dL — ABNORMAL HIGH (ref 65–99)
POTASSIUM: 3.9 mmol/L (ref 3.5–5.1)
SODIUM: 142 mmol/L (ref 135–145)

## 2018-05-09 LAB — HEPATIC FUNCTION PANEL
ALBUMIN: 2 g/dL — AB (ref 3.5–5.0)
ALT: 22 U/L (ref 14–54)
AST: 41 U/L (ref 15–41)
Alkaline Phosphatase: 123 U/L (ref 38–126)
BILIRUBIN DIRECT: 0.6 mg/dL — AB (ref 0.1–0.5)
Indirect Bilirubin: 0.7 mg/dL (ref 0.3–0.9)
Total Bilirubin: 1.3 mg/dL — ABNORMAL HIGH (ref 0.3–1.2)
Total Protein: 5.6 g/dL — ABNORMAL LOW (ref 6.5–8.1)

## 2018-05-09 LAB — TROPONIN I
TROPONIN I: 0.83 ng/mL — AB (ref <0.03)
TROPONIN I: 0.4 ng/mL — AB (ref <0.03)
Troponin I: 0.44 ng/mL (ref <0.03)

## 2018-05-09 LAB — CBC WITH DIFFERENTIAL/PLATELET
BASOS ABS: 0 10*3/uL (ref 0.0–0.1)
BASOS PCT: 0 %
EOS PCT: 0 %
Eosinophils Absolute: 0 10*3/uL (ref 0.0–0.7)
HEMATOCRIT: 29.2 % — AB (ref 36.0–46.0)
HEMOGLOBIN: 9.3 g/dL — AB (ref 12.0–15.0)
LYMPHS ABS: 1.3 10*3/uL (ref 0.7–4.0)
Lymphocytes Relative: 8 %
MCH: 27.5 pg (ref 26.0–34.0)
MCHC: 31.8 g/dL (ref 30.0–36.0)
MCV: 86.4 fL (ref 78.0–100.0)
MONOS PCT: 5 %
Monocytes Absolute: 0.8 10*3/uL (ref 0.1–1.0)
NEUTROS ABS: 13.8 10*3/uL — AB (ref 1.7–7.7)
Neutrophils Relative %: 87 %
Platelets: 148 10*3/uL — ABNORMAL LOW (ref 150–400)
RBC: 3.38 MIL/uL — ABNORMAL LOW (ref 3.87–5.11)
RDW: 16.3 % — ABNORMAL HIGH (ref 11.5–15.5)
WBC: 15.9 10*3/uL — ABNORMAL HIGH (ref 4.0–10.5)

## 2018-05-09 LAB — MAGNESIUM: MAGNESIUM: 1.8 mg/dL (ref 1.7–2.4)

## 2018-05-09 LAB — URINE CULTURE: Culture: >=100000 — AB

## 2018-05-09 LAB — GLUCOSE, CAPILLARY: GLUCOSE-CAPILLARY: 128 mg/dL — AB (ref 65–99)

## 2018-05-09 LAB — LACTIC ACID, PLASMA
Lactic Acid, Venous: 1.3 mmol/L (ref 0.5–1.9)
Lactic Acid, Venous: 1.3 mmol/L (ref 0.5–1.9)

## 2018-05-09 MED ORDER — SODIUM CHLORIDE 0.9 % IV SOLN
1.0000 g | INTRAVENOUS | Status: DC
  Administered 2018-05-09 – 2018-05-10 (×2): 1 g via INTRAVENOUS
  Filled 2018-05-09 (×2): qty 10

## 2018-05-09 MED ORDER — FUROSEMIDE 10 MG/ML IJ SOLN
20.0000 mg | Freq: Once | INTRAMUSCULAR | Status: AC
  Administered 2018-05-09: 20 mg via INTRAVENOUS
  Filled 2018-05-09: qty 2

## 2018-05-09 MED ORDER — MAGNESIUM SULFATE IN D5W 1-5 GM/100ML-% IV SOLN
1.0000 g | Freq: Once | INTRAVENOUS | Status: AC
  Administered 2018-05-09: 1 g via INTRAVENOUS
  Filled 2018-05-09: qty 100

## 2018-05-09 NOTE — Progress Notes (Signed)
PULMONARY / CRITICAL CARE MEDICINE   Name: Miranda Hutchinson MRN: [id number] DOB: 28-May-1946    ADMISSION DATE:  05/07/2018 CONSULTATION DATE:  05/07/18  REFERRING MD:  Myna Hidalgo  CHIEF COMPLAINT:  hypotension  BRIEF SACRED ROA is a 72 y.o. female with PMH as outlined below. She was brought to [facility] ED 05/06/18 after being found down on the floor at home with confusion.  Pt does not remember falling but states that she did not lose consciousness.  She states she hasn't felt well for the past few days with generalized weakness.  Denies any fevers/chills/sweats, headaches, chest pain, SOB, N/V/D, abd pain, myalgias.  No exposures to known sick contacts.  Has not had very good appetites for 3 days.  She has also had dysuria.  She had CT head and Cspine that was negative.  CXR showed left basilar atelectasis.  She had CT stone study that showed mild hydro with possible left UPJ stricture; however, no obstructing stone noted.  She was also found to have hypotension. She was given 4L IVF; however, MAPs remained in mid 50's.  PCCM was subsequently consulted for consideration transfer to ICU.   PAST MEDICAL HISTORY :  She  has a past medical history of Aortic insufficiency, Ascending aortic aneurysm (HCC), Aspirin intolerance, Bilateral carotid artery disease ([employer]), Coronary artery disease, Dyslipidemia, Heart murmur, Hyperlipidemia, Hyperthyroidism, Hypothyroidism, RBBB, and Renal insufficiency.  PAST SURGICAL HISTORY: She  has a past surgical history that includes Tonsillectomy; Abdominal hysterectomy (1990's); Cardiac catheterization (08/04/2012); Coronary artery bypass graft (08/10/2012); Thoracic aortic aneurysm repair (08/10/2012); Aortic valve replacement (08/10/2012); Breast excisional biopsy (Left); and IR NEPHROSTOMY PLACEMENT LEFT (05/08/2018).     EVENTS 05/07/2018  - admit,  CT renal stone 5/8 > mild left hydro, stricture at left UPJ.  38mm nonobstructing left renal pole calculus. Extensive  diverticulosis. CT head 5/10 > negative. US renal 5/10 >  Moderate left hydro Blood 5/10 >  Urine 5/10 >  Vanc 5/10 >  5/11 Merrem5/10 > 5/12 05/08/18  - remains on neo. Worsening AKI/now in ATN range. Urine looks concentrated. Not on vent. Not confused per RN s/p nephrostomy tube l;eft 512 - start ceftriaxone    SUBJECTIVE/OVERNIGHT/INTERVAL HX 5/12 - s/p nephrostomy tube yesterday and after that 6L hypoxemia with infilrates, coontinued pressor need and delirium with metabolic acidosis needing bic gtt  And temp 103F. This AM - still on pressors neo improved to 53mcg, 4L [place]  Improved  but resolved delirium and fever curbe better. Creat stilll rising 3.5mg % - but rate of rise improved. Growing E colii - cefriaxone sensitive in urine  VITAL SIGNS: BP (!) 100/52 (BP Location: Right Arm)   Pulse 70   Temp 98.5 F (36.9 C) (Oral)   Resp 14   SpO2 97%   HEMODYNAMICS:    VENTILATOR SETTINGS:    INTAKE / OUTPUT: I/O last 3 completed shifts: In: 6376.8 [P.O.:80; I.V.:4129.6; IV Piggyback:2167.2] Out: 2365 [Urine:2365]   PHYSICAL EXAMINATION:  General Appearance:    Obese, feels better. Looks well  Head:    Normocephalic, without obvious abnormality, atraumatic  Eyes:    PERRL - yes, conjunctiva/corneas - clear      Ears:    Normal external ear canals, both ears  Nose:   NG tube - no but has [place] o2  Throat:  ETT TUBE - no , OG tube - no  Neck:   Supple,  No enlargement/tenderness/nodules     Lungs:     Clear to auscultation bilaterally,  Chest wall:    No deformity  Heart:    S1 and S2 normal, no murmur, CVP - no.  Pressors - yes  Abdomen:     Soft, no masses, no organomegaly  Genitalia:    Not done  Rectal:   not done  Extremities:   Extremities- intact     Skin:   Intact in exposed areas .      Neurologic:   Sedation - none -> RASS - +1 . Moves all 4s - yes. CAM-ICU - neg . Orientation - x3+        PULMONARY Recent Labs  Lab 05/08/18 1623  PHART 7.283*   PCO2ART 32.1  PO2ART 67.0*  HCO3 15.2*  TCO2 16*  O2SAT 91.0    CBC Recent Labs  Lab 05/08/18 0253 05/08/18 2040 05/09/18 0312  HGB 9.9* 11.0* 9.3*  HCT 31.1* 34.4* 29.2*  WBC 10.0 6.5 15.9*  PLT 164 123* 148*    COAGULATION Recent Labs  Lab 05/07/18 0601  INR 1.32    CARDIAC   Recent Labs  Lab 05/09/18 0312  TROPONINI 0.83*   No results for input(s): PROBNP in the last 168 hours.   CHEMISTRY Recent Labs  Lab 05/07/18 0253 05/08/18 0253 05/08/18 0932 05/08/18 2040 05/09/18 0024 05/09/18 0312  NA 135 143 142 141 142  --   K 4.3 4.7 4.3 3.7 3.9  --   CL 104 115* 113* 108 111  --   CO2 22 19* 17* 20* 22  --   GLUCOSE 150* 98 94 92 135*  --   BUN 23* 31* 34* 32* 32*  --   CREATININE 2.58* 3.39* 3.62* 3.46* 3.52*  --   CALCIUM 8.9 8.0* 7.8* 8.0* 7.8*  --   MG  --  1.7  --   --   --  1.8  PHOS  --  3.4  --   --   --  3.5   Estimated Creatinine Clearance: 16.5 mL/min (A) (by C-G formula based on SCr of 3.52 mg/dL (H)).   LIVER Recent Labs  Lab 05/05/18 0201 05/07/18 0253 05/07/18 0601 05/09/18 0312  AST 19 22  --  41  ALT 17 13*  --  22  ALKPHOS 58 65  --  123  BILITOT 0.5 1.3*  --  1.3*  PROT 7.2 6.5  --  5.6*  ALBUMIN 3.6 2.8*  --  2.0*  INR  --   --  1.32  --      INFECTIOUS Recent Labs  Lab 05/08/18 2040 05/09/18 0024 05/09/18 0312  LATICACIDVEN 3.0* 1.3 1.3     ENDOCRINE CBG (last 3)  Recent Labs    05/07/18 0952 05/08/18 0900 05/09/18 0725  GLUCAP 100* 96 128*         IMAGING x48h  - image(s) personally visualized  -   highlighted in bold Dg Chest Port 1 View  Result Date: 05/08/2018 CLINICAL DATA:  72 year old female with altered mental status, left kidney obstruction status post percutaneous nephrostomy. Respiratory compromise. EXAM: PORTABLE CHEST 1 VIEW COMPARISON:  05/07/2018 chest radiographs and earlier. FINDINGS: Portable AP semi upright view at 1619 hours. Lower lung volumes with increasing left lung  base opacity. Stable cardiomegaly and mediastinal contours. Prior CABG and cardiac valve replacement. Increased pulmonary vascularity, but no overt edema. No pneumothorax or pleural effusion identified. Paucity of bowel gas in the upper abdomen. IMPRESSION: 1. Lower lung volumes with decreasing ventilation at the left lung base, favor atelectasis. 2. Increased  pulmonary vascular congestion but no overt pulmonary edema at this time. Electronically Signed   By: Genevie Ann M.D.   On: 05/08/2018 16:40   Ir Nephrostomy Placement Left  Result Date: 05/08/2018 INDICATION: 72 year old female with a history of sepsis and left ureteral obstruction. EXAM: IR NEPHROSTOMY PLACEMENT LEFT COMPARISON:  None. MEDICATIONS: None ANESTHESIA/SEDATION: Fentanyl 100 mcg IV; Versed 2.0 mg IV Moderate Sedation Time:  14 minutes The patient was continuously monitored during the procedure by the interventional radiology nurse under my direct supervision. CONTRAST:  10 cc-administered into the collecting system(s) FLUOROSCOPY TIME:  Fluoroscopy Time: 0 minutes 36 seconds (3.8 mGy). COMPLICATIONS: None immediate. PROCEDURE: Informed written consent was obtained from the patient after a thorough discussion of the procedural risks, benefits and alternatives. All questions were addressed. Maximal Sterile Barrier Technique was utilized including caps, mask, sterile gowns, sterile gloves, sterile drape, hand hygiene and skin antiseptic. A timeout was performed prior to the initiation of the procedure. Patient positioned prone position on the fluoroscopy table. Ultrasound survey of the left flank was performed with images stored and sent to PACs. The patient was then prepped and draped in the usual sterile fashion. 1% lidocaine was used to anesthetize the skin and subcutaneous tissues for local anesthesia. A Chiba needle was then used to access a posterior inferior calyx with ultrasound guidance. With spontaneous urine returned through the needle,  passage of an 018 micro wire into the collecting system was performed under fluoroscopy. A small incision was made with an 11 blade scalpel, and the needle was removed from the wire. An Accustick system was then advanced over the wire into the collecting system under fluoroscopy. The metal stiffener and inner dilator were removed, and then a sample of fluid was aspirated through the 4 French outer sheath. Bentson wire was passed into the collecting system and the sheath removed. Ten French dilation of the soft tissues was performed. Using modified Seldinger technique, a 10 French pigtail catheter drain was placed over the Bentson wire. Wire and inner stiffener removed, and the pigtail was formed in the collecting system. Small amount of contrast confirmed position of the catheter. Patient tolerated the procedure well and remained hemodynamically stable throughout. No complications were encountered and no significant blood loss encountered IMPRESSION: Status post left percutaneous nephrostomy. Signed, Dulcy Fanny. Earleen Newport, DO [facility] Radiology Electronically Signed   By: Corrie Mckusick D.O.   On: 05/08/2018 15:14    DISCUSSION: 72 y.o. female admitted 5/10 after being found down on floor at home. Found to have sepsis, likely due to UTI.  ASSESSMENT / PLAN:  PULMONARY A: Mild to moderate acue hypoxemic resp failure with infiltrates - 05/09/18 - lkikely ALI or Acute volume overload  P:   Monitorr Pulm toilet Lasix x 20mg  IV  X 1 empiric   CARDIOVASCULAR A:  Hx CAD, HTN, RBB, HLD, AAA (s/p bentall 2013).  Shock - combo septic from UTI + hypovolemic from decreased PO intake.     On 05/09/2018 - circulatory shock remains but improving. Now looks volume overloaded a bit  P:  Neosynephrine as needed for goal MAP > 65. Might need CVL. Continue preadmission crestor. Hold preadmission lopressor, losartan. Gentle lasix x 1  RENAL A:   AKI with left  hydro and s/p nephrostomy tube 05/08/18  5/12- makes urine but creat still 3.5mg % . On Bic gtt for metabolic acidosis   P:   Appreciate urology and IR assist Wean bic gtt to 50cc/h -  track bic in bmet to wean off   GASTROINTESTINAL A:   Nutrition. P:   Advance diet to clears  HEMATOLOGIC A:   VTE Prophylaxis. P:  SCD's / heparin. CBC in AM.  INFECTIOUS Recent Labs  Lab 05/08/18 2040 05/09/18 0024 05/09/18 2703  LATICACIDVEN 3.0* 1.3 1.3   Recent Results (from the past 240 hour(s))  Blood Culture (routine x 2)     Status: None (Preliminary result)   Collection Time: 05/07/18  2:45 AM  Result Value Ref Range Status   Specimen Description BLOOD RIGHT ANTECUBITAL  Final   Special Requests   Final    BOTTLES DRAWN AEROBIC AND ANAEROBIC Blood Culture results may not be optimal due to an excessive volume of blood received in culture bottles   Culture   Final    NO GROWTH 1 DAY Performed at [facility] [address] [place], [place] [address]    Report Status PENDING  Incomplete  Blood Culture (routine x 2)     Status: None (Preliminary result)   Collection Time: 05/07/18  3:10 AM  Result Value Ref Range Status   Specimen Description BLOOD RIGHT FOREARM  Final   Special Requests   Final    BOTTLES DRAWN AEROBIC AND ANAEROBIC Blood Culture adequate volume   Culture   Final    NO GROWTH 1 DAY Performed at [facility], [place] [address], [place], [place] [address]    Report Status PENDING  Incomplete  Urine culture     Status: Abnormal   Collection Time: 05/07/18  4:50 AM  Result Value Ref Range Status   Specimen Description URINE, CATHETERIZED  Final   Special Requests   Final    NONE Performed at [facility], [place] [address] [place], [place] [address]    Culture >=100,000 COLONIES/mL ESCHERICHIA COLI (A)  Final   Report Status 05/09/2018 FINAL  Final   Organism ID, Bacteria ESCHERICHIA COLI (A)  Final      Susceptibility   Escherichia coli -  MIC*    AMPICILLIN >=32 RESISTANT Resistant     CEFAZOLIN <=4 SENSITIVE Sensitive     CEFTRIAXONE <=1 SENSITIVE Sensitive     CIPROFLOXACIN >=4 RESISTANT Resistant     GENTAMICIN <=1 SENSITIVE Sensitive     IMIPENEM <=0.25 SENSITIVE Sensitive     NITROFURANTOIN <=16 SENSITIVE Sensitive     TRIMETH/SULFA >=320 RESISTANT Resistant     AMPICILLIN/SULBACTAM 16 INTERMEDIATE Intermediate     PIP/TAZO <=4 SENSITIVE Sensitive     Extended ESBL NEGATIVE Sensitive     * >=100,000 COLONIES/mL ESCHERICHIA COLI  MRSA PCR Screening     Status: None   Collection Time: 05/07/18  9:49 AM  Result Value Ref Range Status   MRSA by PCR NEGATIVE NEGATIVE Final    Comment:        The GeneXpert MRSA Assay (FDA approved for NASAL specimens only), is one component of a comprehensive MRSA colonization surveillance program. It is not intended to diagnose MRSA infection nor to guide or monitor treatment for MRSA infections. Performed at [facility], [place] [address], [place],  [address]   Aerobic/Anaerobic Culture (surgical/deep wound)     Status: None (Preliminary result)   Collection Time: 05/08/18  4:44 PM  Result Value Ref Range Status   Specimen Description URINE, RANDOM KIDNEY LEFT  Final   Special Requests NONE  Final   Gram Stain   Final    ABUNDANT WBC PRESENT,  PREDOMINANTLY PMN MODERATE GRAM NEGATIVE RODS Performed at [facility], [place] [address], [place], [place] [address]    Culture PENDING  Incomplete   Report Status PENDING  Incomplete      A:   Likely due to stone  P:   Narrow antibiotic to ceftriaxone  ENDOCRINE A:   Hx hypothyroidism - not on meds.  Results for [place], STACEE EARP (MRN [id number]) as of 05/08/2018 08:57  Ref. Range 01/28/2018 15:39 05/07/2018 09:53  TSH Latest Ref Range: 0.350 - 4.500 uIU/mL 0.421 (L) 0.229 (L)   P:   monitor  NEUROLOGIC A:   Deconditioned but neuro intact  05/09/18 - delirium post nephrostoly tube resolved  P:    Supportive care.  Family updated: Family daughter Victorio Palm from [place] updated in detail 05/08/18 PM at bedside   Interdisciplinary Family Meeting v Palliative Care Meeting:  Due by: 05/14/18.     The patient is critically ill with multiple organ systems failure and requires high complexity decision making for assessment and support, frequent evaluation and titration of therapies, application of advanced monitoring technologies and extensive interpretation of multiple databases.   Critical Care Time devoted to patient care services described in this note is  30  Minutes. This time reflects time of care of this signee Dr Brand Males. This critical care time does not reflect procedure time, or teaching time or supervisory time of PA/NP/Med student/Med Resident etc but could involve care discussion time    Dr. Brand Males, M.D., [facility].C.P Pulmonary and Critical Care Medicine Staff Physician [place] [facility] Pager: [phone], If no answer or between  15:00h - 7:00h: call [phone]  05/09/2018 8:23 AM

## 2018-05-09 NOTE — Progress Notes (Signed)
Urology Inpatient Progress Report  Acute kidney injury (nontraumatic) (HCC) [N17.9] AKI (acute kidney injury) ([place]) [N17.9] Sepsis secondary to UTI ([place]) [A41.9, N39.0] Normochromic normocytic anemia [D64.9] Urinary tract infection with hematuria, site unspecified [N39.0, R31.9] Altered mental status, unspecified altered mental status type [R41.82] Septic shock ([place]) [A41.9, R65.21]        Intv/Subj: No acute events overnight. Patient is without complaint.  Mental status has improved.  She herself feels much improved.  Pressor requirement is decreasing.  Creatinine currently stable.  She has been afebrile.  Urine culture growing E. coli sensitive to ceftriaxone.  Principal Problem:   Severe sepsis ([place]) Active Problems:   Coronary artery disease   Essential hypertension, benign   AKI (acute kidney injury) ([place])   Sepsis secondary to UTI ([place])   Septic shock ([place])  Current Facility-Administered Medications  Medication Dose Route Frequency Provider Last Rate Last Dose  . acetaminophen (TYLENOL) tablet 650 mg  650 mg Oral Q6H PRN Omar Person, NP       Or  . acetaminophen (TYLENOL) suppository 650 mg  650 mg Rectal Q6H PRN Omar Person, NP   650 mg at 05/08/18 2003  . cefTRIAXone (ROCEPHIN) 1 g in sodium chloride 0.9 % 100 mL IVPB  1 g Intravenous Q24H Brand Males, MD   Stopped at 05/09/18 0913  . cycloSPORINE (RESTASIS) 0.05 % ophthalmic emulsion 1 drop  1 drop Both Eyes BID Opyd, Ilene Qua, MD   1 drop at 05/09/18 1054  . fentaNYL (SUBLIMAZE) injection 25-50 mcg  25-50 mcg Intravenous Q2H PRN Opyd, Ilene Qua, MD   25 mcg at 05/09/18 1257  . heparin injection 5,000 Units  5,000 Units Subcutaneous Q8H Monia Sabal, PA-C   5,000 Units at 05/09/18 1300  . MEDLINE mouth rinse  15 mL Mouth Rinse BID Brand Males, MD   15 mL at 05/09/18 0813  . ondansetron (ZOFRAN) tablet 4 mg  4 mg Oral Q6H PRN Opyd, Ilene Qua, MD       Or  . ondansetron (ZOFRAN) injection  4 mg  4 mg Intravenous Q6H PRN Opyd, Ilene Qua, MD      . phenylephrine (NEO-SYNEPHRINE) 10 mg in sodium chloride 0.9 % 250 mL (0.04 mg/mL) infusion  0-400 mcg/min Intravenous Titrated Opyd, Ilene Qua, MD 60 mL/hr at 05/09/18 1400 40 mcg/min at 05/09/18 1400  . sodium bicarbonate 150 mEq in dextrose 5 % 1,000 mL infusion   Intravenous Continuous Brand Males, MD 50 mL/hr at 05/09/18 4174    . sodium chloride flush (NS) 0.9 % injection 3 mL  3 mL Intravenous Q12H Opyd, Ilene Qua, MD   3 mL at 05/09/18 0846     Objective: Vital: Vitals:   05/09/18 1345 05/09/18 1400 05/09/18 1415 05/09/18 1430  BP: 122/70 (!) 102/41 116/69 (!) 109/56  Pulse: (!) 104 80 80 84  Resp: (!) 21 11 15 17   Temp:      TempSrc:      SpO2: 97% 97% 98% 95%   I/Os: I/O last 3 completed shifts: In: 6916.8 [P.O.:80; I.V.:4669.6; IV Piggyback:2167.2] Out: 2365 [Urine:2365]  Physical Exam:  General: Patient is in no apparent distress Lungs: Normal respiratory effort, chest expands symmetrically. GI: The abdomen is soft and nontender without mass. Foley: Clear yellow urine Left nephrostomy tube: Clear yellow urine Ext: lower extremities symmetric  Lab Results: Recent Labs    05/08/18 0253 05/08/18 2040 05/09/18 0312  WBC 10.0 6.5 15.9*  HGB 9.9* 11.0* 9.3*  HCT 31.1*  34.4* 29.2*   Recent Labs    05/08/18 0932 05/08/18 2040 05/09/18 0024  NA 142 141 142  K 4.3 3.7 3.9  CL 113* 108 111  CO2 17* 20* 22  GLUCOSE 94 92 135*  BUN 34* 32* 32*  CREATININE 3.62* 3.46* 3.52*  CALCIUM 7.8* 8.0* 7.8*   Recent Labs    05/07/18 0601  INR 1.32   No results for input(s): LABURIN in the last 72 hours. Results for orders placed or performed during the hospital encounter of 05/07/18  Blood Culture (routine x 2)     Status: None (Preliminary result)   Collection Time: 05/07/18  2:45 AM  Result Value Ref Range Status   Specimen Description BLOOD RIGHT ANTECUBITAL  Final   Special Requests   Final     BOTTLES DRAWN AEROBIC AND ANAEROBIC Blood Culture results may not be optimal due to an excessive volume of blood received in culture bottles   Culture   Final    NO GROWTH 2 DAYS Performed at [facility] [address], [place], [place] [address]    Report Status PENDING  Incomplete  Blood Culture (routine x 2)     Status: None (Preliminary result)   Collection Time: 05/07/18  3:10 AM  Result Value Ref Range Status   Specimen Description BLOOD RIGHT FOREARM  Final   Special Requests   Final    BOTTLES DRAWN AEROBIC AND ANAEROBIC Blood Culture adequate volume   Culture   Final    NO GROWTH 2 DAYS Performed at [facility], [place] [address], [place], [place] [address]    Report Status PENDING  Incomplete  Urine culture     Status: Abnormal   Collection Time: 05/07/18  4:50 AM  Result Value Ref Range Status   Specimen Description URINE, CATHETERIZED  Final   Special Requests   Final    NONE Performed at [facility], [place] [address], [place], [place] [address]    Culture >=100,000 COLONIES/mL ESCHERICHIA COLI (A)  Final   Report Status 05/09/2018 FINAL  Final   Organism ID, Bacteria ESCHERICHIA COLI (A)  Final      Susceptibility   Escherichia coli - MIC*    AMPICILLIN >=32 RESISTANT Resistant     CEFAZOLIN <=4 SENSITIVE Sensitive     CEFTRIAXONE <=1 SENSITIVE Sensitive     CIPROFLOXACIN >=4 RESISTANT Resistant     GENTAMICIN <=1 SENSITIVE Sensitive     IMIPENEM <=0.25 SENSITIVE Sensitive     NITROFURANTOIN <=16 SENSITIVE Sensitive     TRIMETH/SULFA >=320 RESISTANT Resistant     AMPICILLIN/SULBACTAM 16 INTERMEDIATE Intermediate     PIP/TAZO <=4 SENSITIVE Sensitive     Extended ESBL NEGATIVE Sensitive     * >=100,000 COLONIES/mL ESCHERICHIA COLI  MRSA PCR Screening     Status: None   Collection Time: 05/07/18  9:49 AM  Result Value Ref Range Status   MRSA by PCR NEGATIVE NEGATIVE Final    Comment:        The GeneXpert MRSA Assay (FDA approved for NASAL  specimens only), is one component of a comprehensive MRSA colonization surveillance program. It is not intended to diagnose MRSA infection nor to guide or monitor treatment for MRSA infections. Performed at [facility], [place] [address] [place], [place] [address]   Aerobic/Anaerobic Culture (surgical/deep wound)     Status: None (Preliminary result)   Collection Time: 05/08/18  4:44 PM  Result Value Ref Range Status  Specimen Description URINE, RANDOM KIDNEY LEFT  Final   Special Requests NONE  Final   Gram Stain   Final    ABUNDANT WBC PRESENT, PREDOMINANTLY PMN MODERATE GRAM NEGATIVE RODS Performed at [facility], [place] [address], [place], [place] [address]    Culture Emmit Pomfret NEGATIVE RODS  Final   Report Status PENDING  Incomplete    Studies/Results: Dg Chest Port 1 View  Result Date: 05/08/2018 CLINICAL DATA:  72 year old female with altered mental status, left kidney obstruction status post percutaneous nephrostomy. Respiratory compromise. EXAM: PORTABLE CHEST 1 VIEW COMPARISON:  05/07/2018 chest radiographs and earlier. FINDINGS: Portable AP semi upright view at 1619 hours. Lower lung volumes with increasing left lung base opacity. Stable cardiomegaly and mediastinal contours. Prior CABG and cardiac valve replacement. Increased pulmonary vascularity, but no overt edema. No pneumothorax or pleural effusion identified. Paucity of bowel gas in the upper abdomen. IMPRESSION: 1. Lower lung volumes with decreasing ventilation at the left lung base, favor atelectasis. 2. Increased pulmonary vascular congestion but no overt pulmonary edema at this time. Electronically Signed   By: Genevie Ann M.D.   On: 05/08/2018 16:40   Ir Nephrostomy Placement Left  Result Date: 05/08/2018 INDICATION: 72 year old female with a history of sepsis and left ureteral obstruction. EXAM: IR NEPHROSTOMY PLACEMENT LEFT COMPARISON:  None. MEDICATIONS: None ANESTHESIA/SEDATION: Fentanyl 100 mcg  IV; Versed 2.0 mg IV Moderate Sedation Time:  14 minutes The patient was continuously monitored during the procedure by the interventional radiology nurse under my direct supervision. CONTRAST:  10 cc-administered into the collecting system(s) FLUOROSCOPY TIME:  Fluoroscopy Time: 0 minutes 36 seconds (3.8 mGy). COMPLICATIONS: None immediate. PROCEDURE: Informed written consent was obtained from the patient after a thorough discussion of the procedural risks, benefits and alternatives. All questions were addressed. Maximal Sterile Barrier Technique was utilized including caps, mask, sterile gowns, sterile gloves, sterile drape, hand hygiene and skin antiseptic. A timeout was performed prior to the initiation of the procedure. Patient positioned prone position on the fluoroscopy table. Ultrasound survey of the left flank was performed with images stored and sent to PACs. The patient was then prepped and draped in the usual sterile fashion. 1% lidocaine was used to anesthetize the skin and subcutaneous tissues for local anesthesia. A Chiba needle was then used to access a posterior inferior calyx with ultrasound guidance. With spontaneous urine returned through the needle, passage of an 018 micro wire into the collecting system was performed under fluoroscopy. A small incision was made with an 11 blade scalpel, and the needle was removed from the wire. An Accustick system was then advanced over the wire into the collecting system under fluoroscopy. The metal stiffener and inner dilator were removed, and then a sample of fluid was aspirated through the 4 French outer sheath. Bentson wire was passed into the collecting system and the sheath removed. Ten French dilation of the soft tissues was performed. Using modified Seldinger technique, a 10 French pigtail catheter drain was placed over the Bentson wire. Wire and inner stiffener removed, and the pigtail was formed in the collecting system. Small amount of contrast  confirmed position of the catheter. Patient tolerated the procedure well and remained hemodynamically stable throughout. No complications were encountered and no significant blood loss encountered IMPRESSION: Status post left percutaneous nephrostomy. Signed, Dulcy Fanny. Earleen Newport, DO [facility] Radiology Electronically Signed   By: Corrie Mckusick D.O.   On: 05/08/2018 15:14    Assessment: Left ureteropelvic  junction obstruction Mild right hydronephrosis Urinary tract infection with sepsis secondary to the above.  Plan: Continue left-sided nephrostomy tube to drainage.  Continue antibiotics.  Currently, she is clinically improving.  On the CT scan, she did have mild right-sided hydronephrosis.  I agree with radiology that there is a pelvic calcification that is about 3 mm down in the pelvis.  To me, it does not appear to be a ureteral calculus but this cannot completely be excluded.  She is asymptomatic on the right whereas she was having left-sided pain.  As long as she is clinically improving, recommend observation of this right side.  However, if she does not clinically improve or starts clinically decompensating, would recommend repeating either a CT or ultrasound and if there is persistent right hydronephrosis, consider ureteral stent placement versus nephrostomy tube depending on her clinical status.  Again, as stated above, she is clinically improving and therefore recommend continuing Foley catheter and left nephrostomy tube, and observing the right side.   Link Snuffer, MD Urology 05/09/2018, 2:51 PM

## 2018-05-09 NOTE — Progress Notes (Signed)
Pm walk round  Doing well Talking to friend Still on neo but coming down Making good urine Still on bic  Plan Keep in icu   Dr. Brand Males, M.D., [facility].C.P Pulmonary and Critical Care Medicine Staff Physician, [place] Director - Interstitial Lung Disease  Program  Pulmonary [place] [facility] [place], [place], [address]  Pager: [phone], If no answer or between  15:00h - 7:00h: call [phone] Telephone: [phone]

## 2018-05-09 NOTE — Progress Notes (Signed)
Referring Physician(s): Dr Halford Chessman  Supervising Physician: Corrie Mckusick  Patient Status:  [facility] - In-pt  Chief Complaint:  L Hydronephrosis L PCN 5/11 in IR  Subjective:  Procedure: Left PCN.  To gravity. Cx sent  Complications: None  Feeling better Resting Grandsons in room   Allergies: Percocet [oxycodone-acetaminophen] and Asa [aspirin]  Medications: Prior to Admission medications   Medication Sig Start Date End Date Taking? Authorizing Provider  cycloSPORINE (RESTASIS) 0.05 % ophthalmic emulsion Place 1 drop into both eyes 2 (two) times daily.    Yes [provider]  losartan (COZAAR) 100 MG tablet Take 50 mg by mouth daily.   Yes [provider]  metoprolol tartrate (LOPRESSOR) 25 MG tablet Take 25 mg by mouth 2 (two) times daily.   Yes [provider]  Multiple Vitamins-Minerals (ALIVE WOMENS 50+ PO) Take 1 tablet by mouth daily.   Yes [provider]  rosuvastatin (CRESTOR) 40 MG tablet TAKE 1 TABLET(40 MG) BY MOUTH DAILY 10/19/17  Yes Dunn, Dayna N, PA-C     Vital Signs: BP (!) 125/57   Pulse 69   Temp 98.5 F (36.9 C) (Oral)   Resp (!) 23   SpO2 99%   Physical Exam  Skin: Skin is warm and dry.  Left PCN intact Clean and dry Sl tender No bleeding  OP clear yellow: 1L yesterday 350 cc recorded today-- 300 cc in bag  Nursing note and vitals reviewed.   Imaging: Ct Head Wo Contrast  Result Date: 05/07/2018 CLINICAL DATA:  Patient found on the floor at home, incoherent and confused. EXAM: CT HEAD WITHOUT CONTRAST CT CERVICAL SPINE WITHOUT CONTRAST TECHNIQUE: Multidetector CT imaging of the head and cervical spine was performed following the standard protocol without intravenous contrast. Multiplanar CT image reconstructions of the cervical spine were also generated. COMPARISON:  None FINDINGS: CT HEAD FINDINGS Brain: Normal involutional changes of the brain for age. No large vascular territory infarct, hemorrhage,  midline shift or edema. No extra-axial fluid collections. No intra-axial mass. Vascular: No hyperdense vessel sign. Skull: Intact skull without acute nor suspicious osseous abnormality. Sinuses/Orbits: Mild ethmoid sinus mucosal thickening. No acute paranasal sinus disease. Clear mastoids. Intact orbits and globes. Other: None CT CERVICAL SPINE FINDINGS Alignment: Maintained cervical lordosis Skull base and vertebrae: No acute fracture. No aggressive osseous lesions. Probable subchondral cyst of the C4 vertebral body. Soft tissues and spinal canal: No prevertebral fluid or swelling. No visible canal hematoma. Disc levels: Moderate disc flattening at C5-6 with small posterior marginal osteophytes and uncinate spurring. No significant central canal stenosis. Minimal C5-6 neural foraminal encroachment on the left from uncinate spurs. Multilevel mild degenerative facet arthropathy. Upper chest: Negative. Other: None IMPRESSION: 1. Normal head CT for age. 2. Degenerative disc disease C5-6. No acute cervical spine fracture or posttraumatic listhesis. Electronically Signed   By: Ashley Royalty M.D.   On: 05/07/2018 02:55   Ct Cervical Spine Wo Contrast  Result Date: 05/07/2018 CLINICAL DATA:  Patient found on the floor at home, incoherent and confused. EXAM: CT HEAD WITHOUT CONTRAST CT CERVICAL SPINE WITHOUT CONTRAST TECHNIQUE: Multidetector CT imaging of the head and cervical spine was performed following the standard protocol without intravenous contrast. Multiplanar CT image reconstructions of the cervical spine were also generated. COMPARISON:  None FINDINGS: CT HEAD FINDINGS Brain: Normal involutional changes of the brain for age. No large vascular territory infarct, hemorrhage, midline shift or edema. No extra-axial fluid collections. No intra-axial mass. Vascular: No hyperdense vessel sign. Skull:  Intact skull without acute nor suspicious osseous abnormality. Sinuses/Orbits: Mild ethmoid sinus mucosal thickening.  No acute paranasal sinus disease. Clear mastoids. Intact orbits and globes. Other: None CT CERVICAL SPINE FINDINGS Alignment: Maintained cervical lordosis Skull base and vertebrae: No acute fracture. No aggressive osseous lesions. Probable subchondral cyst of the C4 vertebral body. Soft tissues and spinal canal: No prevertebral fluid or swelling. No visible canal hematoma. Disc levels: Moderate disc flattening at C5-6 with small posterior marginal osteophytes and uncinate spurring. No significant central canal stenosis. Minimal C5-6 neural foraminal encroachment on the left from uncinate spurs. Multilevel mild degenerative facet arthropathy. Upper chest: Negative. Other: None IMPRESSION: 1. Normal head CT for age. 2. Degenerative disc disease C5-6. No acute cervical spine fracture or posttraumatic listhesis. Electronically Signed   By: Ashley Royalty M.D.   On: 05/07/2018 02:55   US Renal  Result Date: 05/07/2018 CLINICAL DATA:  Acute kidney injury. EXAM: RENAL / URINARY TRACT ULTRASOUND COMPLETE COMPARISON:  CT scan of May 05, 2018. FINDINGS: Right Kidney: Length: 11.2 cm. Echogenicity within normal limits. No mass visualized. Mild right hydronephrosis is noted. Left Kidney: Length: 13.5 cm. Echogenicity within normal limits. No mass visualized. Moderate hydronephrosis is noted. Bladder: Appears normal for degree of bladder distention. Right ureteral jet is visualized. Left ureteral jet is not visualized. IMPRESSION: Mild right hydronephrosis.  Moderate left hydronephrosis is noted. Electronically Signed   By: Marijo Conception, M.D.   On: 05/07/2018 07:31   Dg Chest Port 1 View  Result Date: 05/08/2018 CLINICAL DATA:  72 year old female with altered mental status, left kidney obstruction status post percutaneous nephrostomy. Respiratory compromise. EXAM: PORTABLE CHEST 1 VIEW COMPARISON:  05/07/2018 chest radiographs and earlier. FINDINGS: Portable AP semi upright view at 1619 hours. Lower lung volumes with  increasing left lung base opacity. Stable cardiomegaly and mediastinal contours. Prior CABG and cardiac valve replacement. Increased pulmonary vascularity, but no overt edema. No pneumothorax or pleural effusion identified. Paucity of bowel gas in the upper abdomen. IMPRESSION: 1. Lower lung volumes with decreasing ventilation at the left lung base, favor atelectasis. 2. Increased pulmonary vascular congestion but no overt pulmonary edema at this time. Electronically Signed   By: Genevie Ann M.D.   On: 05/08/2018 16:40   Dg Chest Port 1 View  Result Date: 05/07/2018 CLINICAL DATA:  72 year old female with fever and sepsis. EXAM: PORTABLE CHEST 1 VIEW COMPARISON:  Chest radiograph dated 09/06/2012 FINDINGS: Minimal left lung base atelectasis. Infiltrate is less likely. There is no focal consolidation, pleural effusion, or pneumothorax. Mild cardiomegaly. Median sternotomy wires and mechanical cardiac valve. Atherosclerotic calcification of the aortic arch. No acute osseous pathology. IMPRESSION: Left lung base atelectatic changes. Infiltrate is not excluded. Clinical correlation is recommended. No focal consolidation. Electronically Signed   By: Anner Crete M.D.   On: 05/07/2018 02:47   Ir Nephrostomy Placement Left  Result Date: 05/08/2018 INDICATION: 72 year old female with a history of sepsis and left ureteral obstruction. EXAM: IR NEPHROSTOMY PLACEMENT LEFT COMPARISON:  None. MEDICATIONS: None ANESTHESIA/SEDATION: Fentanyl 100 mcg IV; Versed 2.0 mg IV Moderate Sedation Time:  14 minutes The patient was continuously monitored during the procedure by the interventional radiology nurse under my direct supervision. CONTRAST:  10 cc-administered into the collecting system(s) FLUOROSCOPY TIME:  Fluoroscopy Time: 0 minutes 36 seconds (3.8 mGy). COMPLICATIONS: None immediate. PROCEDURE: Informed written consent was obtained from the patient after a thorough discussion of the procedural risks, benefits and  alternatives. All questions were addressed. Maximal Sterile Barrier Technique  was utilized including caps, mask, sterile gowns, sterile gloves, sterile drape, hand hygiene and skin antiseptic. A timeout was performed prior to the initiation of the procedure. Patient positioned prone position on the fluoroscopy table. Ultrasound survey of the left flank was performed with images stored and sent to PACs. The patient was then prepped and draped in the usual sterile fashion. 1% lidocaine was used to anesthetize the skin and subcutaneous tissues for local anesthesia. A Chiba needle was then used to access a posterior inferior calyx with ultrasound guidance. With spontaneous urine returned through the needle, passage of an 018 micro wire into the collecting system was performed under fluoroscopy. A small incision was made with an 11 blade scalpel, and the needle was removed from the wire. An Accustick system was then advanced over the wire into the collecting system under fluoroscopy. The metal stiffener and inner dilator were removed, and then a sample of fluid was aspirated through the 4 French outer sheath. Bentson wire was passed into the collecting system and the sheath removed. Ten French dilation of the soft tissues was performed. Using modified Seldinger technique, a 10 French pigtail catheter drain was placed over the Bentson wire. Wire and inner stiffener removed, and the pigtail was formed in the collecting system. Small amount of contrast confirmed position of the catheter. Patient tolerated the procedure well and remained hemodynamically stable throughout. No complications were encountered and no significant blood loss encountered IMPRESSION: Status post left percutaneous nephrostomy. Signed, Dulcy Fanny. Earleen Newport, DO [facility] Radiology Electronically Signed   By: Corrie Mckusick D.O.   On: 05/08/2018 15:14    Labs:  CBC: Recent Labs    05/07/18 0253  05/08/18 0253 05/08/18 2040 05/09/18 0312  WBC 11.1* 10.0 6.5 15.9*  HGB 10.9* 9.9* 11.0* 9.3*  HCT 34.0* 31.1* 34.4* 29.2*  PLT 177 164 123* 148*    COAGS: Recent Labs    05/07/18 0601  INR 1.32  APTT 36    BMP: Recent Labs    05/08/18 0253 05/08/18 0932 05/08/18 2040 05/09/18 0024  NA 143 142 141 142  K 4.7 4.3 3.7 3.9  CL 115* 113* 108 111  CO2 19* 17* 20* 22  GLUCOSE 98 94 92 135*  BUN 31* 34* 32* 32*  CALCIUM 8.0* 7.8* 8.0* 7.8*  CREATININE 3.39* 3.62* 3.46* 3.52*  GFRNONAA 12* 11* 12* 12*  GFRAA 14* 13* 14* 14*    LIVER FUNCTION TESTS: Recent Labs    04/14/18 1714 05/05/18 0201 05/07/18 0253 05/09/18 0312  BILITOT 0.8 0.5 1.3* 1.3*  AST 18 19 22  41  ALT 14 17 13* 22  ALKPHOS 70 58 65 123  PROT 8.4* 7.2 6.5 5.6*  ALBUMIN 3.6 3.6 2.8* 2.0*    Assessment and Plan:  L PCN intact OP clear yellow urine Will follow   Electronically Signed: Naresh Althaus A, PA-C 05/09/2018, 10:19 AM   I spent a total of 15 Minutes at the the patient's bedside AND on the patient's hospital floor or unit, greater than 50% of which was counseling/coordinating care for L PCN

## 2018-05-10 DIAGNOSIS — E876 Hypokalemia: Secondary | ICD-10-CM

## 2018-05-10 DIAGNOSIS — R6521 Severe sepsis with septic shock: Secondary | ICD-10-CM

## 2018-05-10 LAB — BASIC METABOLIC PANEL
Anion gap: 10 (ref 5–15)
BUN: 30 mg/dL — AB (ref 6–20)
CHLORIDE: 105 mmol/L (ref 101–111)
CO2: 27 mmol/L (ref 22–32)
CREATININE: 3.33 mg/dL — AB (ref 0.44–1.00)
Calcium: 7.8 mg/dL — ABNORMAL LOW (ref 8.9–10.3)
GFR calc Af Amer: 15 mL/min — ABNORMAL LOW (ref >60)
GFR calc non Af Amer: 13 mL/min — ABNORMAL LOW (ref >60)
GLUCOSE: 112 mg/dL — AB (ref 65–99)
POTASSIUM: 3.3 mmol/L — AB (ref 3.5–5.1)
Sodium: 142 mmol/L (ref 135–145)

## 2018-05-10 LAB — CBC WITH DIFFERENTIAL/PLATELET
Basophils Absolute: 0 10*3/uL (ref 0.0–0.1)
Basophils Relative: 0 %
EOS ABS: 0.2 10*3/uL (ref 0.0–0.7)
Eosinophils Relative: 2 %
HCT: 27.7 % — ABNORMAL LOW (ref 36.0–46.0)
Hemoglobin: 8.9 g/dL — ABNORMAL LOW (ref 12.0–15.0)
LYMPHS ABS: 1.2 10*3/uL (ref 0.7–4.0)
LYMPHS PCT: 13 %
MCH: 27.2 pg (ref 26.0–34.0)
MCHC: 32.1 g/dL (ref 30.0–36.0)
MCV: 84.7 fL (ref 78.0–100.0)
MONO ABS: 1.2 10*3/uL — AB (ref 0.1–1.0)
MONOS PCT: 12 %
Neutro Abs: 7.3 10*3/uL (ref 1.7–7.7)
Neutrophils Relative %: 73 %
PLATELETS: 142 10*3/uL — AB (ref 150–400)
RBC: 3.27 MIL/uL — AB (ref 3.87–5.11)
RDW: 16 % — AB (ref 11.5–15.5)
WBC: 9.9 10*3/uL (ref 4.0–10.5)

## 2018-05-10 LAB — GLUCOSE, CAPILLARY: Glucose-Capillary: 102 mg/dL — ABNORMAL HIGH (ref 65–99)

## 2018-05-10 LAB — TROPONIN I: TROPONIN I: 0.23 ng/mL — AB (ref <0.03)

## 2018-05-10 LAB — MAGNESIUM: Magnesium: 1.9 mg/dL (ref 1.7–2.4)

## 2018-05-10 LAB — PHOSPHORUS: Phosphorus: 2.3 mg/dL — ABNORMAL LOW (ref 2.5–4.6)

## 2018-05-10 MED ORDER — POTASSIUM PHOSPHATES 15 MMOLE/5ML IV SOLN
20.0000 mmol | Freq: Once | INTRAVENOUS | Status: AC
  Administered 2018-05-10: 20 mmol via INTRAVENOUS
  Filled 2018-05-10: qty 6.67

## 2018-05-10 MED ORDER — SODIUM CHLORIDE 0.9 % IV SOLN
1.0000 g | INTRAVENOUS | Status: DC
  Administered 2018-05-11 – 2018-05-12 (×2): 1 g via INTRAVENOUS
  Filled 2018-05-10 (×3): qty 10

## 2018-05-10 NOTE — Progress Notes (Signed)
[facility] Progress Note Patient Name: Miranda Hutchinson DOB: [birth year] MRN: [id number]   Date of Service  05/10/2018  HPI/Events of Note  Hypokalemia and Hypophosphatemia - K+ = 3.3, PO4--- = 1.9 and Creatinine = 3.33.  eICU Interventions  Will cautiously replace K+ and PO4---.      Intervention Category Major Interventions: Electrolyte abnormality - evaluation and management  Karey Suthers [place] 05/10/2018, 4:23 AM

## 2018-05-10 NOTE — Evaluation (Signed)
Physical Therapy Evaluation Patient Details Name: Miranda Hutchinson MRN: [id number] DOB: 01/04/1946 Today's Date: 05/10/2018   History of Present Illness  72 y.o. female with PMH as outlined below. She was brought to [facility] ED 05/06/18 after being found down on the floor at home with confusion. CXR showed left basilar atelectasis.  She had CT stone study that showed mild hydro with possible left UPJ stricture; however, no obstructing stone noted.  She was also found to have hypotension. She was given 4L IVF; however, MAPs remained in mid 50's. Found to have sepsis, likely due to UTI  Clinical Impression  PTA pt was independent with limited community ambulation, driving and iADLs. Pt does report increasing fatigue with everyday activities especially cooking. Pt currently limited in mobility by generalized weakness and fatigue. Pt currently mod I for bed mobility, minA for sit>stand and min guard for pivot transfers from recliner to bed. Pt had been sitting up all morning and attributes that to her fatigue. PT currently recommending discharge home with HHPT if she can navigate 2 steps to get into her home. PT will continue to follow acutely until d/c.     Follow Up Recommendations Home health PT;Supervision for mobility/OOB    Equipment Recommendations  Other (comment)(will need RW if can't find husband's old one)    Recommendations for Other Services       Precautions / Restrictions Precautions Precautions: Fall Restrictions Weight Bearing Restrictions: No      Mobility  Bed Mobility Overal bed mobility: Modified Independent             General bed mobility comments: increased time and effort to manage LE into bed  Transfers Overall transfer level: Needs assistance Equipment used: 1 person hand held assist Transfers: Sit to/from Stand;Stand Pivot Transfers Sit to Stand: Min assist Stand pivot transfers: Min guard       General transfer comment: minA for power up into standing,  min guard for pivoting to bed, able to static stand for pericare prior to sitting on bed  Ambulation/Gait             General Gait Details: only wanted to take pivoting steps back to bed        Balance Overall balance assessment: Needs assistance Sitting-balance support: Feet supported;No upper extremity supported Sitting balance-Leahy Scale: Good     Standing balance support: No upper extremity supported;During functional activity Standing balance-Leahy Scale: Fair Standing balance comment: able to stand for pericare                             Pertinent Vitals/Pain Pain Assessment: No/denies pain    Home Living Family/patient expects to be discharged to:: Private residence Living Arrangements: Other relatives Available Help at Discharge: Family;Available PRN/intermittently Type of Home: House Home Access: Stairs to enter Entrance Stairs-Rails: None Entrance Stairs-Number of Steps: 2 Home Layout: One level Home Equipment: Walker - 2 wheels      Prior Function Level of Independence: Independent         Comments: walking limited community distances, driving and taking care of all iADLs, pt does note increased fatigue with activities like cooking and shopping     Hand Dominance        Extremity/Trunk Assessment   Upper Extremity Assessment Upper Extremity Assessment: Generalized weakness    Lower Extremity Assessment Lower Extremity Assessment: Generalized weakness       Communication   Communication: No difficulties  Cognition  Arousal/Alertness: Awake/alert Behavior During Therapy: WFL for tasks assessed/performed Overall Cognitive Status: Within Functional Limits for tasks assessed                                        General Comments General comments (skin integrity, edema, etc.): VSS         Assessment/Plan    PT Assessment Patient needs continued PT services  PT Problem List Decreased strength;Decreased  activity tolerance;Decreased mobility;Cardiopulmonary status limiting activity       PT Treatment Interventions DME instruction;Gait training;Stair training;Functional mobility training;Therapeutic activities;Therapeutic exercise;Balance training;Patient/family education    PT Goals (Current goals can be found in the Care Plan section)  Acute Rehab PT Goals Patient Stated Goal: go home and be less fatigued with activity PT Goal Formulation: With patient Time For Goal Achievement: 05/24/18 Potential to Achieve Goals: Good    Frequency Min 3X/week    AM-PAC PT "6 Clicks" Daily Activity  Outcome Measure Difficulty turning over in bed (including adjusting bedclothes, sheets and blankets)?: A Little Difficulty moving from lying on back to sitting on the side of the bed? : A Little Difficulty sitting down on and standing up from a chair with arms (e.g., wheelchair, bedside commode, etc,.)?: Unable Help needed moving to and from a bed to chair (including a wheelchair)?: A Little Help needed walking in hospital room?: A Little Help needed climbing 3-5 steps with a railing? : A Lot 6 Click Score: 15    End of Session Equipment Utilized During Treatment: Gait belt Activity Tolerance: Patient tolerated treatment well Patient left: in bed;with call bell/phone within reach;with bed alarm set Nurse Communication: Mobility status PT Visit Diagnosis: Unsteadiness on feet (R26.81);Other abnormalities of gait and mobility (R26.89);Muscle weakness (generalized) (M62.81);Difficulty in walking, not elsewhere classified (R26.2)    Time: 2947-6546 PT Time Calculation (min) (ACUTE ONLY): 19 min   Charges:   PT Evaluation $PT Eval Moderate Complexity: 1 Mod     PT G Codes:        Tylynn Braniff B. Migdalia Dk PT, DPT Acute Rehabilitation  [phone] Pager [phone]    [place] 05/10/2018, 3:51 PM

## 2018-05-10 NOTE — Progress Notes (Signed)
PULMONARY / CRITICAL CARE MEDICINE   Name: Miranda Hutchinson MRN: [id number] DOB: 20-Feb-1946    ADMISSION DATE:  05/07/2018 CONSULTATION DATE:  05/07/18  REFERRING MD:  Myna Hidalgo  CHIEF COMPLAINT:  hypotension   BRIEF Miranda Hutchinson is a 72 y.o. female with PMH as outlined below. She was brought to [facility] ED 05/06/18 after being found down on the floor at home with confusion.  Pt does not remember falling but states that she did not lose consciousness.  She states she hasn't felt well for the past few days with generalized weakness.  Denies any fevers/chills/sweats, headaches, chest pain, SOB, N/V/D, abd pain, myalgias.  No exposures to known sick contacts.  Has not had very good appetites for 3 days.  She has also had dysuria.  She had CT head and Cspine that was negative.  CXR showed left basilar atelectasis.  She had CT stone study that showed mild hydro with possible left UPJ stricture; however, no obstructing stone noted.  She was also found to have hypotension. She was given 4L IVF; however, MAPs remained in mid 50's.  PCCM was subsequently consulted for consideration transfer to ICU.  EVENTS 05/07/2018  - admit,  CT renal stone 5/8 > mild left hydro, stricture at left UPJ.  17mm nonobstructing left renal pole calculus. Extensive diverticulosis. CT head 5/10 > negative. US renal 5/10 >  Moderate left hydro Blood 5/10 >  Urine 5/10 >  Vanc 5/10 >  5/11 Merrem5/10 > 5/12 05/08/18  - remains on neo. Worsening AKI/now in ATN range. Urine looks concentrated. Not on vent. Not confused per RN s/p nephrostomy tube l;eft 512 - start ceftriaxone  SUBJECTIVE/OVERNIGHT/INTERVAL HX No events overnight, off pressors since 5/12  VITAL SIGNS: BP (!) 112/58 (BP Location: Right Arm)   Pulse 73   Temp 98.5 F (36.9 C) (Oral)   Resp 19   SpO2 97%   HEMODYNAMICS:    VENTILATOR SETTINGS:    INTAKE / OUTPUT: I/O last 3 completed shifts: In: 5755.3 [P.O.:1170; I.V.:4385.3; IV Piggyback:200] Out: 5625  [Urine:5625]   PHYSICAL EXAMINATION:  General Appearance:    Obese, feels better. Looks well  Head:    Normocephalic, without obvious abnormality, atraumatic  Eyes:    PERRL, EOM-I and MMM      Nose:   Clear  Throat:  Clear  Neck:   Supple, no masses palpable     Lungs:     CTA bilaterally      Heart:    RRR, Nl S1/S2 and -M/R/G  Abdomen:     Soft, no masses, no organomegaly        Extremities:   -edema and -tenderness     Skin:   Intact      Neurologic:   Alert and interactive, moving all ext to command, appropriate   PULMONARY Recent Labs  Lab 05/08/18 1623  PHART 7.283*  PCO2ART 32.1  PO2ART 67.0*  HCO3 15.2*  TCO2 16*  O2SAT 91.0   CBC Recent Labs  Lab 05/08/18 2040 05/09/18 0312 05/10/18 0227  HGB 11.0* 9.3* 8.9*  HCT 34.4* 29.2* 27.7*  WBC 6.5 15.9* 9.9  PLT 123* 148* 142*   COAGULATION Recent Labs  Lab 05/07/18 0601  INR 1.32   CARDIAC   Recent Labs  Lab 05/09/18 0312 05/09/18 0915 05/09/18 1532 05/10/18 0227  TROPONINI 0.83* 0.44* 0.40* 0.23*   No results for input(s): PROBNP in the last 168 hours.  CHEMISTRY Recent Labs  Lab 05/08/18 0253 05/08/18 0932  05/08/18 2040 05/09/18 0024 05/09/18 0312 05/10/18 0227  NA 143 142 141 142  --  142  K 4.7 4.3 3.7 3.9  --  3.3*  CL 115* 113* 108 111  --  105  CO2 19* 17* 20* 22  --  27  GLUCOSE 98 94 92 135*  --  112*  BUN 31* 34* 32* 32*  --  30*  CREATININE 3.39* 3.62* 3.46* 3.52*  --  3.33*  CALCIUM 8.0* 7.8* 8.0* 7.8*  --  7.8*  MG 1.7  --   --   --  1.8 1.9  PHOS 3.4  --   --   --  3.5 2.3*   Estimated Creatinine Clearance: 17.4 mL/min (A) (by C-G formula based on SCr of 3.33 mg/dL (H)).  LIVER Recent Labs  Lab 05/05/18 0201 05/07/18 0253 05/07/18 0601 05/09/18 0312  AST 19 22  --  41  ALT 17 13*  --  22  ALKPHOS 58 65  --  123  BILITOT 0.5 1.3*  --  1.3*  PROT 7.2 6.5  --  5.6*  ALBUMIN 3.6 2.8*  --  2.0*  INR  --   --  1.32  --    INFECTIOUS Recent Labs  Lab  05/08/18 2040 05/09/18 0024 05/09/18 0312  LATICACIDVEN 3.0* 1.3 1.3   ENDOCRINE CBG (last 3)  Recent Labs    05/08/18 0900 05/09/18 0725 05/10/18 0728  GLUCAP 96 128* 102*   IMAGING x48h  - image(s) personally visualized  -   highlighted in bold Dg Chest Port 1 View  Result Date: 05/08/2018 CLINICAL DATA:  72 year old female with altered mental status, left kidney obstruction status post percutaneous nephrostomy. Respiratory compromise. EXAM: PORTABLE CHEST 1 VIEW COMPARISON:  05/07/2018 chest radiographs and earlier. FINDINGS: Portable AP semi upright view at 1619 hours. Lower lung volumes with increasing left lung base opacity. Stable cardiomegaly and mediastinal contours. Prior CABG and cardiac valve replacement. Increased pulmonary vascularity, but no overt edema. No pneumothorax or pleural effusion identified. Paucity of bowel gas in the upper abdomen. IMPRESSION: 1. Lower lung volumes with decreasing ventilation at the left lung base, favor atelectasis. 2. Increased pulmonary vascular congestion but no overt pulmonary edema at this time. Electronically Signed   By: Genevie Ann M.D.   On: 05/08/2018 16:40   Ir Nephrostomy Placement Left  Result Date: 05/08/2018 INDICATION: 72 year old female with a history of sepsis and left ureteral obstruction. EXAM: IR NEPHROSTOMY PLACEMENT LEFT COMPARISON:  None. MEDICATIONS: None ANESTHESIA/SEDATION: Fentanyl 100 mcg IV; Versed 2.0 mg IV Moderate Sedation Time:  14 minutes The patient was continuously monitored during the procedure by the interventional radiology nurse under my direct supervision. CONTRAST:  10 cc-administered into the collecting system(s) FLUOROSCOPY TIME:  Fluoroscopy Time: 0 minutes 36 seconds (3.8 mGy). COMPLICATIONS: None immediate. PROCEDURE: Informed written consent was obtained from the patient after a thorough discussion of the procedural risks, benefits and alternatives. All questions were addressed. Maximal Sterile Barrier  Technique was utilized including caps, mask, sterile gowns, sterile gloves, sterile drape, hand hygiene and skin antiseptic. A timeout was performed prior to the initiation of the procedure. Patient positioned prone position on the fluoroscopy table. Ultrasound survey of the left flank was performed with images stored and sent to PACs. The patient was then prepped and draped in the usual sterile fashion. 1% lidocaine was used to anesthetize the skin and subcutaneous tissues for local anesthesia. A Chiba needle was then used to access a posterior inferior calyx  with ultrasound guidance. With spontaneous urine returned through the needle, passage of an 018 micro wire into the collecting system was performed under fluoroscopy. A small incision was made with an 11 blade scalpel, and the needle was removed from the wire. An Accustick system was then advanced over the wire into the collecting system under fluoroscopy. The metal stiffener and inner dilator were removed, and then a sample of fluid was aspirated through the 4 French outer sheath. Bentson wire was passed into the collecting system and the sheath removed. Ten French dilation of the soft tissues was performed. Using modified Seldinger technique, a 10 French pigtail catheter drain was placed over the Bentson wire. Wire and inner stiffener removed, and the pigtail was formed in the collecting system. Small amount of contrast confirmed position of the catheter. Patient tolerated the procedure well and remained hemodynamically stable throughout. No complications were encountered and no significant blood loss encountered IMPRESSION: Status post left percutaneous nephrostomy. Signed, Dulcy Fanny. Earleen Newport, DO [facility] Radiology Electronically Signed   By: Corrie Mckusick D.O.   On: 05/08/2018 15:14   I reviewed CXR myself, no acute disease noted  DISCUSSION: 72 y.o. female admitted 5/10 after being found down on floor  at home. Found to have sepsis, likely due to UTI.  ASSESSMENT / PLAN:  PULMONARY A: Mild to moderate acue hypoxemic resp failure with infiltrates - 05/09/18 - lkikely ALI or Acute volume overload  P:   Titrate O2 for sat of 88-92% Pulm toilet Hold further lasix  CARDIOVASCULAR A:  Hx CAD, HTN, RBB, HLD, AAA (s/p bentall 2013). Shock - combo septic from UTI + hypovolemic from decreased PO intake.   P:  D/C neo synephrine  KVO IVF D/C bicarb drip Continue preadmission crestor. Hold preadmission lopressor, losartan.  RENAL A:   AKI with left hydro and s/p nephrostomy tube 05/08/18  P:   Appreciate urology and IR KVO IVF K-dur 20 meq PO x1 BMET in AM Replace electrolytes as indicated  GASTROINTESTINAL A:   Nutrition. P:   Heart healthy diet  HEMATOLOGIC A:   VTE Prophylaxis. P:  SCD's / heparin. CBC in AM. Transfuse per ICU protocol  INFECTIOUS Pyelo, improved after stent  A:   P:   Rocephin for 3 more days F/U on cultures  ENDOCRINE A:   Hx hypothyroidism - not on meds.  Results for Gutierrez, STATIA BURDICK (MRN [id number]) as of 05/08/2018 08:57  Ref. Range 01/28/2018 15:39 05/07/2018 09:53  TSH Latest Ref Range: 0.350 - 4.500 uIU/mL 0.421 (L) 0.229 (L)   P:   Monitor clinically   NEUROLOGIC A:   Deconditioned but neuro intact  P:   Supportive care. Avoid medications  Family updated: Patient updated bedside  Interdisciplinary Family Meeting v Palliative Care Meeting:  Due by: 05/14/18.  Transfer to med surg and to [facility] service with PCCM off 5/14.  Discussed with TRH-MD  Rush Farmer, M.D. [facility] Pulmonary/Critical Care Medicine. Pager: [phone]. After hours pager: [phone].   05/10/2018 9:53 AM

## 2018-05-10 NOTE — Plan of Care (Signed)
Slowly increasing diet, patient tolerating Clear liquid diet.  Complaints of minimal L back pain from nephrostomy tube, able to reposition with decrease in pain

## 2018-05-10 NOTE — Progress Notes (Signed)
Urology Inpatient Progress Report  Acute kidney injury (nontraumatic) (HCC) [N17.9] AKI (acute kidney injury) ([place]) [N17.9] Sepsis secondary to UTI ([place]) [A41.9, N39.0] Normochromic normocytic anemia [D64.9] Urinary tract infection with hematuria, site unspecified [N39.0, R31.9] Altered mental status, unspecified altered mental status type [R41.82] Septic shock ([place]) [A41.9, R65.21]        Intv/Subj: No acute events overnight. Patient is without complaint. Patient is off pressors and has been afebrile.  She has minimal complaints.  She only complains about some mild left-sided pain.  Urine culture is growing E. coli which is sensitive to ceftriaxone which is what she is on.  Principal Problem:   Severe sepsis ([place]) Active Problems:   Coronary artery disease   Essential hypertension, benign   AKI (acute kidney injury) ([place])   Sepsis secondary to UTI ([place])   Septic shock ([place])  Current Facility-Administered Medications  Medication Dose Route Frequency Provider Last Rate Last Dose  . acetaminophen (TYLENOL) tablet 650 mg  650 mg Oral Q6H PRN Omar Person, NP   650 mg at 05/10/18 7124   Or  . acetaminophen (TYLENOL) suppository 650 mg  650 mg Rectal Q6H PRN Omar Person, NP   650 mg at 05/08/18 2003  . [START ON 05/11/2018] cefTRIAXone (ROCEPHIN) 1 g in sodium chloride 0.9 % 100 mL IVPB  1 g Intravenous Q24H Sood, Vineet, MD      . cycloSPORINE (RESTASIS) 0.05 % ophthalmic emulsion 1 drop  1 drop Both Eyes BID Opyd, Ilene Qua, MD   1 drop at 05/10/18 0853  . fentaNYL (SUBLIMAZE) injection 25-50 mcg  25-50 mcg Intravenous Q2H PRN Opyd, Ilene Qua, MD   25 mcg at 05/09/18 2021  . heparin injection 5,000 Units  5,000 Units Subcutaneous Q8H Monia Sabal, PA-C   5,000 Units at 05/10/18 1306  . MEDLINE mouth rinse  15 mL Mouth Rinse BID Brand Males, MD   15 mL at 05/09/18 0813  . ondansetron (ZOFRAN) tablet 4 mg  4 mg Oral Q6H PRN Opyd, Ilene Qua, MD       Or  .  ondansetron (ZOFRAN) injection 4 mg  4 mg Intravenous Q6H PRN Opyd, Ilene Qua, MD      . sodium chloride flush (NS) 0.9 % injection 3 mL  3 mL Intravenous Q12H Opyd, Ilene Qua, MD   3 mL at 05/09/18 0846     Objective: Vital: Vitals:   05/10/18 1200 05/10/18 1300 05/10/18 1400 05/10/18 1529  BP: 128/68  (!) 119/56   Pulse: 71 73 80   Resp: 17 19 (!) 22   Temp:    97.8 F (36.6 C)  TempSrc:    Oral  SpO2: 93% 96% 95%    I/Os: I/O last 3 completed shifts: In: 6279.9 [P.O.:1170; I.V.:4403.3; IV Piggyback:706.7] Out: 5625 [Urine:5625]  Physical Exam:  General: Patient is in no apparent distress Lungs: Normal respiratory effort, chest expands symmetrically. GI: The abdomen is soft and nontender without mass. Foley: Draining clear yellow urine.  Left nephrostomy tube also draining clear yellow urine Ext: lower extremities symmetric  Lab Results: Recent Labs    05/08/18 2040 05/09/18 0312 05/10/18 0227  WBC 6.5 15.9* 9.9  HGB 11.0* 9.3* 8.9*  HCT 34.4* 29.2* 27.7*   Recent Labs    05/08/18 2040 05/09/18 0024 05/10/18 0227  NA 141 142 142  K 3.7 3.9 3.3*  CL 108 111 105  CO2 20* 22 27  GLUCOSE 92 135* 112*  BUN 32* 32* 30*  CREATININE  3.46* 3.52* 3.33*  CALCIUM 8.0* 7.8* 7.8*   No results for input(s): LABPT, INR in the last 72 hours. No results for input(s): LABURIN in the last 72 hours. Results for orders placed or performed during the hospital encounter of 05/07/18  Blood Culture (routine x 2)     Status: None (Preliminary result)   Collection Time: 05/07/18  2:45 AM  Result Value Ref Range Status   Specimen Description BLOOD RIGHT ANTECUBITAL  Final   Special Requests   Final    BOTTLES DRAWN AEROBIC AND ANAEROBIC Blood Culture results may not be optimal due to an excessive volume of blood received in culture bottles   Culture   Final    NO GROWTH 3 DAYS Performed at [facility] [address], [place], [place] [address]    Report Status PENDING   Incomplete  Blood Culture (routine x 2)     Status: None (Preliminary result)   Collection Time: 05/07/18  3:10 AM  Result Value Ref Range Status   Specimen Description BLOOD RIGHT FOREARM  Final   Special Requests   Final    BOTTLES DRAWN AEROBIC AND ANAEROBIC Blood Culture adequate volume   Culture   Final    NO GROWTH 3 DAYS Performed at [facility], [place] [address] [place], [place] [address]    Report Status PENDING  Incomplete  Urine culture     Status: Abnormal   Collection Time: 05/07/18  4:50 AM  Result Value Ref Range Status   Specimen Description URINE, CATHETERIZED  Final   Special Requests   Final    NONE Performed at [facility], [place] [address], [place], [place] [address]    Culture >=100,000 COLONIES/mL ESCHERICHIA COLI (A)  Final   Report Status 05/09/2018 FINAL  Final   Organism ID, Bacteria ESCHERICHIA COLI (A)  Final      Susceptibility   Escherichia coli - MIC*    AMPICILLIN >=32 RESISTANT Resistant     CEFAZOLIN <=4 SENSITIVE Sensitive     CEFTRIAXONE <=1 SENSITIVE Sensitive     CIPROFLOXACIN >=4 RESISTANT Resistant     GENTAMICIN <=1 SENSITIVE Sensitive     IMIPENEM <=0.25 SENSITIVE Sensitive     NITROFURANTOIN <=16 SENSITIVE Sensitive     TRIMETH/SULFA >=320 RESISTANT Resistant     AMPICILLIN/SULBACTAM 16 INTERMEDIATE Intermediate     PIP/TAZO <=4 SENSITIVE Sensitive     Extended ESBL NEGATIVE Sensitive     * >=100,000 COLONIES/mL ESCHERICHIA COLI  MRSA PCR Screening     Status: None   Collection Time: 05/07/18  9:49 AM  Result Value Ref Range Status   MRSA by PCR NEGATIVE NEGATIVE Final    Comment:        The GeneXpert MRSA Assay (FDA approved for NASAL specimens only), is one component of a comprehensive MRSA colonization surveillance program. It is not intended to diagnose MRSA infection nor to guide or monitor treatment for MRSA infections. Performed at [facility], [place] [address], [place], [place] [address]    Aerobic/Anaerobic Culture (surgical/deep wound)     Status: None (Preliminary result)   Collection Time: 05/08/18  4:44 PM  Result Value Ref Range Status   Specimen Description URINE, RANDOM KIDNEY LEFT  Final   Special Requests NONE  Final   Gram Stain   Final    ABUNDANT WBC PRESENT, PREDOMINANTLY PMN MODERATE GRAM NEGATIVE RODS Performed at [facility], [place] [address], [place], [place]  [address]    Culture   Final    ABUNDANT ESCHERICHIA COLI NO ANAEROBES ISOLATED; CULTURE IN PROGRESS FOR 5 DAYS    Report Status PENDING  Incomplete   Organism ID, Bacteria ESCHERICHIA COLI  Final      Susceptibility   Escherichia coli - MIC*    AMPICILLIN >=32 RESISTANT Resistant     CEFAZOLIN <=4 SENSITIVE Sensitive     CEFTRIAXONE <=1 SENSITIVE Sensitive     CIPROFLOXACIN >=4 RESISTANT Resistant     GENTAMICIN <=1 SENSITIVE Sensitive     IMIPENEM <=0.25 SENSITIVE Sensitive     NITROFURANTOIN <=16 SENSITIVE Sensitive     TRIMETH/SULFA >=320 RESISTANT Resistant     AMPICILLIN/SULBACTAM 16 INTERMEDIATE Intermediate     PIP/TAZO <=4 SENSITIVE Sensitive     Extended ESBL NEGATIVE Sensitive     * ABUNDANT ESCHERICHIA COLI    Studies/Results: No results found.  Assessment: Left ureteropelvic junction obstruction Urinary tract infection, E. Coli/ESBL Sepsis secondary to the above status post left nephrostomy tube placement  Plan: Would recommend removing Foley catheter tomorrow morning as long as she is up and moving around okay.  Looks like she is close to being able to come out of the unit.  Agree with current antibiotic coverage.  Continue nephrostomy tube.  She will eventually need to have a renal scan with Lasix while the nephrostomy tube is capped to evaluate the degree of obstruction and also evaluate the renal function on that side.  We will determine if she is a candidate for pyeloplasty versus nephrectomy versus ureteral stent placement with exchanges.   Link Snuffer,  MD Urology 05/10/2018, 4:46 PM

## 2018-05-10 NOTE — Progress Notes (Signed)
Referring Physician(s): Dr. Halford Chessman  Supervising Physician: Aletta Edouard  Patient Status:  [facility] - In-pt  Chief Complaint: L Hydronephrosis, ureteropelvic obstruction s/p PCN 5/11  Subjective: Sitting up in chair. Eating clear liquids.  Alert, oriented, comfortable.  No complaints.   Allergies: Percocet [oxycodone-acetaminophen] and Asa [aspirin]  Medications: Prior to Admission medications   Medication Sig Start Date End Date Taking? Authorizing Provider  cycloSPORINE (RESTASIS) 0.05 % ophthalmic emulsion Place 1 drop into both eyes 2 (two) times daily.    Yes [provider]  losartan (COZAAR) 100 MG tablet Take 50 mg by mouth daily.   Yes [provider]  metoprolol tartrate (LOPRESSOR) 25 MG tablet Take 25 mg by mouth 2 (two) times daily.   Yes [provider]  Multiple Vitamins-Minerals (ALIVE WOMENS 50+ PO) Take 1 tablet by mouth daily.   Yes [provider]  rosuvastatin (CRESTOR) 40 MG tablet TAKE 1 TABLET(40 MG) BY MOUTH DAILY 10/19/17  Yes Dunn, Dayna N, PA-C     Vital Signs: BP (!) 112/58 (BP Location: Right Arm)   Pulse 73   Temp 98.5 F (36.9 C) (Oral)   Resp 19   SpO2 97%   Physical Exam  Constitutional: She is oriented to person, place, and time. She appears well-developed.  Pulmonary/Chest: Effort normal. No respiratory distress.  Neurological: She is alert and oriented to person, place, and time.  Skin: Skin is warm and dry.  L PCN intact.  Skin site c/d/i. Clear, yellow urine in collection bag.   Nursing note and vitals reviewed.   Imaging: Ct Head Wo Contrast  Result Date: 05/07/2018 CLINICAL DATA:  Patient found on the floor at home, incoherent and confused. EXAM: CT HEAD WITHOUT CONTRAST CT CERVICAL SPINE WITHOUT CONTRAST TECHNIQUE: Multidetector CT imaging of the head and cervical spine was performed following the standard protocol without intravenous contrast. Multiplanar CT image reconstructions of the  cervical spine were also generated. COMPARISON:  None FINDINGS: CT HEAD FINDINGS Brain: Normal involutional changes of the brain for age. No large vascular territory infarct, hemorrhage, midline shift or edema. No extra-axial fluid collections. No intra-axial mass. Vascular: No hyperdense vessel sign. Skull: Intact skull without acute nor suspicious osseous abnormality. Sinuses/Orbits: Mild ethmoid sinus mucosal thickening. No acute paranasal sinus disease. Clear mastoids. Intact orbits and globes. Other: None CT CERVICAL SPINE FINDINGS Alignment: Maintained cervical lordosis Skull base and vertebrae: No acute fracture. No aggressive osseous lesions. Probable subchondral cyst of the C4 vertebral body. Soft tissues and spinal canal: No prevertebral fluid or swelling. No visible canal hematoma. Disc levels: Moderate disc flattening at C5-6 with small posterior marginal osteophytes and uncinate spurring. No significant central canal stenosis. Minimal C5-6 neural foraminal encroachment on the left from uncinate spurs. Multilevel mild degenerative facet arthropathy. Upper chest: Negative. Other: None IMPRESSION: 1. Normal head CT for age. 2. Degenerative disc disease C5-6. No acute cervical spine fracture or posttraumatic listhesis. Electronically Signed   By: Ashley Royalty M.D.   On: 05/07/2018 02:55   Ct Cervical Spine Wo Contrast  Result Date: 05/07/2018 CLINICAL DATA:  Patient found on the floor at home, incoherent and confused. EXAM: CT HEAD WITHOUT CONTRAST CT CERVICAL SPINE WITHOUT CONTRAST TECHNIQUE: Multidetector CT imaging of the head and cervical spine was performed following the standard protocol without intravenous contrast. Multiplanar CT image reconstructions of the cervical spine were also generated. COMPARISON:  None FINDINGS: CT HEAD FINDINGS Brain: Normal involutional changes of the brain for age. No large vascular territory  infarct, hemorrhage, midline shift or edema. No extra-axial fluid  collections. No intra-axial mass. Vascular: No hyperdense vessel sign. Skull: Intact skull without acute nor suspicious osseous abnormality. Sinuses/Orbits: Mild ethmoid sinus mucosal thickening. No acute paranasal sinus disease. Clear mastoids. Intact orbits and globes. Other: None CT CERVICAL SPINE FINDINGS Alignment: Maintained cervical lordosis Skull base and vertebrae: No acute fracture. No aggressive osseous lesions. Probable subchondral cyst of the C4 vertebral body. Soft tissues and spinal canal: No prevertebral fluid or swelling. No visible canal hematoma. Disc levels: Moderate disc flattening at C5-6 with small posterior marginal osteophytes and uncinate spurring. No significant central canal stenosis. Minimal C5-6 neural foraminal encroachment on the left from uncinate spurs. Multilevel mild degenerative facet arthropathy. Upper chest: Negative. Other: None IMPRESSION: 1. Normal head CT for age. 2. Degenerative disc disease C5-6. No acute cervical spine fracture or posttraumatic listhesis. Electronically Signed   By: Ashley Royalty M.D.   On: 05/07/2018 02:55   US Renal  Result Date: 05/07/2018 CLINICAL DATA:  Acute kidney injury. EXAM: RENAL / URINARY TRACT ULTRASOUND COMPLETE COMPARISON:  CT scan of May 05, 2018. FINDINGS: Right Kidney: Length: 11.2 cm. Echogenicity within normal limits. No mass visualized. Mild right hydronephrosis is noted. Left Kidney: Length: 13.5 cm. Echogenicity within normal limits. No mass visualized. Moderate hydronephrosis is noted. Bladder: Appears normal for degree of bladder distention. Right ureteral jet is visualized. Left ureteral jet is not visualized. IMPRESSION: Mild right hydronephrosis.  Moderate left hydronephrosis is noted. Electronically Signed   By: Marijo Conception, M.D.   On: 05/07/2018 07:31   Dg Chest Port 1 View  Result Date: 05/08/2018 CLINICAL DATA:  72 year old female with altered mental status, left kidney obstruction status post percutaneous  nephrostomy. Respiratory compromise. EXAM: PORTABLE CHEST 1 VIEW COMPARISON:  05/07/2018 chest radiographs and earlier. FINDINGS: Portable AP semi upright view at 1619 hours. Lower lung volumes with increasing left lung base opacity. Stable cardiomegaly and mediastinal contours. Prior CABG and cardiac valve replacement. Increased pulmonary vascularity, but no overt edema. No pneumothorax or pleural effusion identified. Paucity of bowel gas in the upper abdomen. IMPRESSION: 1. Lower lung volumes with decreasing ventilation at the left lung base, favor atelectasis. 2. Increased pulmonary vascular congestion but no overt pulmonary edema at this time. Electronically Signed   By: Genevie Ann M.D.   On: 05/08/2018 16:40   Dg Chest Port 1 View  Result Date: 05/07/2018 CLINICAL DATA:  72 year old female with fever and sepsis. EXAM: PORTABLE CHEST 1 VIEW COMPARISON:  Chest radiograph dated 09/06/2012 FINDINGS: Minimal left lung base atelectasis. Infiltrate is less likely. There is no focal consolidation, pleural effusion, or pneumothorax. Mild cardiomegaly. Median sternotomy wires and mechanical cardiac valve. Atherosclerotic calcification of the aortic arch. No acute osseous pathology. IMPRESSION: Left lung base atelectatic changes. Infiltrate is not excluded. Clinical correlation is recommended. No focal consolidation. Electronically Signed   By: Anner Crete M.D.   On: 05/07/2018 02:47   Ir Nephrostomy Placement Left  Result Date: 05/08/2018 INDICATION: 72 year old female with a history of sepsis and left ureteral obstruction. EXAM: IR NEPHROSTOMY PLACEMENT LEFT COMPARISON:  None. MEDICATIONS: None ANESTHESIA/SEDATION: Fentanyl 100 mcg IV; Versed 2.0 mg IV Moderate Sedation Time:  14 minutes The patient was continuously monitored during the procedure by the interventional radiology nurse under my direct supervision. CONTRAST:  10 cc-administered into the collecting system(s) FLUOROSCOPY TIME:  Fluoroscopy Time: 0  minutes 36 seconds (3.8 mGy). COMPLICATIONS: None immediate. PROCEDURE: Informed written consent was obtained from the patient  after a thorough discussion of the procedural risks, benefits and alternatives. All questions were addressed. Maximal Sterile Barrier Technique was utilized including caps, mask, sterile gowns, sterile gloves, sterile drape, hand hygiene and skin antiseptic. A timeout was performed prior to the initiation of the procedure. Patient positioned prone position on the fluoroscopy table. Ultrasound survey of the left flank was performed with images stored and sent to PACs. The patient was then prepped and draped in the usual sterile fashion. 1% lidocaine was used to anesthetize the skin and subcutaneous tissues for local anesthesia. A Chiba needle was then used to access a posterior inferior calyx with ultrasound guidance. With spontaneous urine returned through the needle, passage of an 018 micro wire into the collecting system was performed under fluoroscopy. A small incision was made with an 11 blade scalpel, and the needle was removed from the wire. An Accustick system was then advanced over the wire into the collecting system under fluoroscopy. The metal stiffener and inner dilator were removed, and then a sample of fluid was aspirated through the 4 French outer sheath. Bentson wire was passed into the collecting system and the sheath removed. Ten French dilation of the soft tissues was performed. Using modified Seldinger technique, a 10 French pigtail catheter drain was placed over the Bentson wire. Wire and inner stiffener removed, and the pigtail was formed in the collecting system. Small amount of contrast confirmed position of the catheter. Patient tolerated the procedure well and remained hemodynamically stable throughout. No complications were encountered and no significant blood loss encountered IMPRESSION: Status post left percutaneous nephrostomy. Signed, Dulcy Fanny. Earleen Newport, DO  [facility] Electronically Signed   By: Corrie Mckusick D.O.   On: 05/08/2018 15:14    Labs:  CBC: Recent Labs    05/08/18 0253 05/08/18 2040 05/09/18 0312 05/10/18 0227  WBC 10.0 6.5 15.9* 9.9  HGB 9.9* 11.0* 9.3* 8.9*  HCT 31.1* 34.4* 29.2* 27.7*  PLT 164 123* 148* 142*    COAGS: Recent Labs    05/07/18 0601  INR 1.32  APTT 36    BMP: Recent Labs    05/08/18 0932 05/08/18 2040 05/09/18 0024 05/10/18 0227  NA 142 141 142 142  K 4.3 3.7 3.9 3.3*  CL 113* 108 111 105  CO2 17* 20* 22 27  GLUCOSE 94 92 135* 112*  BUN 34* 32* 32* 30*  CALCIUM 7.8* 8.0* 7.8* 7.8*  CREATININE 3.62* 3.46* 3.52* 3.33*  GFRNONAA 11* 12* 12* 13*  GFRAA 13* 14* 14* 15*    LIVER FUNCTION TESTS: Recent Labs    04/14/18 1714 05/05/18 0201 05/07/18 0253 05/09/18 0312  BILITOT 0.8 0.5 1.3* 1.3*  AST 18 19 22  41  ALT 14 17 13* 22  ALKPHOS 70 58 65 123  PROT 8.4* 7.2 6.5 5.6*  ALBUMIN 3.6 3.6 2.8* 2.0*    Assessment and Plan: Ureteropelvic junction obstruction, L hydronephrosis s/p PCN placement 5/11 Patient doing well s/p placement.  Drain and skin intact.  No erythema or warmth.  Continue current management.  IR to follow.   Electronically Signed: Docia Barrier, PA 05/10/2018, 10:10 AM   I spent a total of 15 Minutes at the the patient's bedside AND on the patient's hospital floor or unit, greater than 50% of which was counseling/coordinating care for left ureteropelvic junction obstruction, hydronephrosis.

## 2018-05-11 DIAGNOSIS — J989 Respiratory disorder, unspecified: Secondary | ICD-10-CM

## 2018-05-11 DIAGNOSIS — Q6211 Congenital occlusion of ureteropelvic junction: Secondary | ICD-10-CM

## 2018-05-11 LAB — CBC WITH DIFFERENTIAL/PLATELET
BASOS PCT: 0 %
Basophils Absolute: 0 10*3/uL (ref 0.0–0.1)
EOS ABS: 0.3 10*3/uL (ref 0.0–0.7)
EOS PCT: 3 %
HCT: 29.8 % — ABNORMAL LOW (ref 36.0–46.0)
Hemoglobin: 9.5 g/dL — ABNORMAL LOW (ref 12.0–15.0)
LYMPHS ABS: 1.3 10*3/uL (ref 0.7–4.0)
Lymphocytes Relative: 14 %
MCH: 27.3 pg (ref 26.0–34.0)
MCHC: 31.9 g/dL (ref 30.0–36.0)
MCV: 85.6 fL (ref 78.0–100.0)
MONO ABS: 1.4 10*3/uL — AB (ref 0.1–1.0)
MONOS PCT: 15 %
NEUTROS ABS: 6.4 10*3/uL (ref 1.7–7.7)
Neutrophils Relative %: 68 %
PLATELETS: 166 10*3/uL (ref 150–400)
RBC: 3.48 MIL/uL — AB (ref 3.87–5.11)
RDW: 16 % — AB (ref 11.5–15.5)
WBC: 9.4 10*3/uL (ref 4.0–10.5)

## 2018-05-11 LAB — BASIC METABOLIC PANEL
Anion gap: 11 (ref 5–15)
BUN: 29 mg/dL — AB (ref 6–20)
CHLORIDE: 103 mmol/L (ref 101–111)
CO2: 31 mmol/L (ref 22–32)
Calcium: 8.4 mg/dL — ABNORMAL LOW (ref 8.9–10.3)
Creatinine, Ser: 3.01 mg/dL — ABNORMAL HIGH (ref 0.44–1.00)
GFR calc non Af Amer: 14 mL/min — ABNORMAL LOW (ref >60)
GFR, EST AFRICAN AMERICAN: 17 mL/min — AB (ref >60)
Glucose, Bld: 108 mg/dL — ABNORMAL HIGH (ref 65–99)
POTASSIUM: 3.2 mmol/L — AB (ref 3.5–5.1)
Sodium: 145 mmol/L (ref 135–145)

## 2018-05-11 LAB — MAGNESIUM: Magnesium: 1.7 mg/dL (ref 1.7–2.4)

## 2018-05-11 LAB — GLUCOSE, CAPILLARY: GLUCOSE-CAPILLARY: 93 mg/dL (ref 65–99)

## 2018-05-11 LAB — PHOSPHORUS: Phosphorus: 4.5 mg/dL (ref 2.5–4.6)

## 2018-05-11 NOTE — Progress Notes (Signed)
Physical Therapy Treatment Patient Details Name: Miranda Hutchinson MRN: [id number] DOB: 04/30/1946 Today's Date: 05/11/2018    History of Present Illness 72 y.o. female with PMH as outlined below. She was brought to [facility] ED 05/06/18 after being found down on the floor at home with confusion. CXR showed left basilar atelectasis.  She had CT stone study that showed mild hydro with possible left UPJ stricture; however, no obstructing stone noted.  She was also found to have hypotension. She was given 4L IVF; however, MAPs remained in mid 50's. Found to have sepsis, likely due to UTI    PT Comments    Pt very pleasant and agreeable to therapy. Pt is making excellent progress towards her goals, however is limited in safe mobility by decreased strength and endurance. Pt is currently mod I for bed mobility, min guard for transfers and ambulation of 120 feet with RW. D/c plans remain appropriate at this time. PT will follow acutely.      Follow Up Recommendations  Home health PT;Supervision for mobility/OOB     Equipment Recommendations  Other (comment)(will need RW if can't find husband's old one)    Recommendations for Other Services       Precautions / Restrictions Precautions Precautions: Fall Restrictions Weight Bearing Restrictions: No    Mobility  Bed Mobility Overal bed mobility: Modified Independent             General bed mobility comments: increased time and effort to manage LE into bed  Transfers Overall transfer level: Needs assistance Equipment used: 1 person hand held assist Transfers: Sit to/from Stand;Stand Pivot Transfers Sit to Stand: Min guard         General transfer comment: min guard for safety, good power up and steadying   Ambulation/Gait Ambulation/Gait assistance: Min guard Ambulation Distance (Feet): 120 Feet Assistive device: Rolling walker (2 wheeled) Gait Pattern/deviations: Step-through pattern;Decreased step length - right;Decreased step  length - left;Shuffle;Trunk flexed     General Gait Details: hands on min guard for safety, slow, steady shuffling gait, vc for proximity to RW and upright posture         Balance Overall balance assessment: Needs assistance Sitting-balance support: Feet supported;No upper extremity supported Sitting balance-Leahy Scale: Good     Standing balance support: No upper extremity supported;During functional activity Standing balance-Leahy Scale: Fair                              Cognition Arousal/Alertness: Awake/alert Behavior During Therapy: WFL for tasks assessed/performed Overall Cognitive Status: Within Functional Limits for tasks assessed                                           General Comments General comments (skin integrity, edema, etc.): prior to activity BP 139/68, HR 86, SaO2 on RA 96% on RA, with ambulation HR max 123 bpm, after activity BP 1128/75, HR 96 bpm, SaO2 on RA 93% O2       Pertinent Vitals/Pain Pain Assessment: No/denies pain           PT Goals (current goals can now be found in the care plan section) Acute Rehab PT Goals Patient Stated Goal: go home and be less fatigued with activity PT Goal Formulation: With patient Time For Goal Achievement: 05/24/18 Potential to Achieve Goals: Good Progress towards PT goals: Progressing  toward goals    Frequency    Min 3X/week      PT Plan Current plan remains appropriate       AM-PAC PT "6 Clicks" Daily Activity  Outcome Measure  Difficulty turning over in bed (including adjusting bedclothes, sheets and blankets)?: A Little Difficulty moving from lying on back to sitting on the side of the bed? : A Little Difficulty sitting down on and standing up from a chair with arms (e.g., wheelchair, bedside commode, etc,.)?: Unable Help needed moving to and from a bed to chair (including a wheelchair)?: A Little Help needed walking in hospital room?: A Little Help needed  climbing 3-5 steps with a railing? : A Lot 6 Click Score: 15    End of Session Equipment Utilized During Treatment: Gait belt Activity Tolerance: Patient tolerated treatment well Patient left: in bed;with call bell/phone within reach;with bed alarm set Nurse Communication: Mobility status PT Visit Diagnosis: Unsteadiness on feet (R26.81);Other abnormalities of gait and mobility (R26.89);Muscle weakness (generalized) (M62.81);Difficulty in walking, not elsewhere classified (R26.2)     Time: 8850-2774 PT Time Calculation (min) (ACUTE ONLY): 26 min  Charges:  $Gait Training: 23-37 mins                    G Codes:       Myka Lukins B. Migdalia Dk PT, DPT Acute Rehabilitation  [phone] Pager [phone]     [place] 05/11/2018, 4:52 PM

## 2018-05-11 NOTE — Progress Notes (Addendum)
PROGRESS NOTE    [name]  ZSW:[id number] DOB: 23-May-1946 DOA: 05/07/2018 PCP: Antony Contras, MD   Brief Narrative:  Miranda Hutchinson is a 72 y.o. female with medical history significant for hypertension, coronary artery disease status post CABG, ascending aortic aneurysm status post Bentall procedure in 2013, and recent increase in serum creatinine with hydronephrosis noted on recent CT, now presenting to the emergency department with confusion after being found on the floor at home.  Patient was seen in the emergency department a couple days ago with left flank pain, underwent CT stone study that revealed mild hydronephrosis with possible left UPJ stricture, but no obstructing stone identified.  She was referred to urology and was seen in the clinic on 05/06/2018 and reports being told that the CT findings are likely chronic.  She had persistent left flank pain and developed dysuria so she decided to come to the ED.   Upon arrival to the ED, patient was found to be febrile to 38.6 C, saturating well on room air, tachycardic in the 110s, and with normal blood pressure initially.  Noncontrast head CT was negative for acute intracranial abnormality and CT cervical spine is negative for acute fracture or subluxation.  Chest x-ray was notable for left basilar atelectasis.  EKG features a sinus tachycardia with RBBB and LPFB.  Chemistry panel was concerning for creatinine 2.58, up from 1.59 two days earlier and up from less than 1 in October.  CBC is notable for leukocytosis to 11,100 and a normocytic anemia with hemoglobin of 10.9.  Lactic acid was reassuringly normal.  Urinalysis returned and is consistent with infection.  Patient was started on vancomycin and Zosyn, and the patient was given a 1 L normal saline bolus. Patient was admitted to the stepdown unit for ongoing management of severe sepsis suspected secondary to UTI.  She did need vasopressors in the stepdown unit and was subsequently weaned  off.  Following conditions were addressed during hospitalization,  Assessment & Plan:   Principal Problem:   Severe sepsis ([place]) Active Problems:   Coronary artery disease   Essential hypertension, benign   AKI (acute kidney injury) ([place])   Sepsis secondary to UTI ([place])   Septic shock ([place])  Severe sepsis secondary to E. coli sepsis.  Patient required vasopressors initially.  Clinically has improved.  On IV Rocephin.  Ureteropelvic junction obstruction with left hydronephrosis status post PCN placement on 5/11.  Urology and interventional radiology following.  Will follow urology recommendation on further scans and intervention if necessary  E. coli UTI sensitive to ceftriaxone.  Will continue antibiotics course  History of hypertension.  Patient required vasopressors.  Off vasopressors at this time.  Will resume antihypertensives when the blood pressure is stable.  History of coronary artery disease status post CABG.  Stable at this time  History of ascending aortic aneurysm status post Bentall procedure.  Stable at this time.  Deconditioning, debility.  Patient has been seen with physical therapy and recommend home health physical therapy  DVT prophylaxis: Heparin subcu  Code Status: Full code  Family Communication: No family members at bedside  Disposition Plan: Home with home health   Consultants: Critical care, urology, interventional radiology  Procedures: Status post left PCN on 05/08/2018, Foley catheter placement  Antimicrobials: Rocephin  Subjective:   Patient denies nausea, vomiting fever but had some chills.  He has been tolerating oral diet.  Denies shortness of breath cough or dyspnea.  Objective: Vitals:   05/11/18 0400 05/11/18 0500  05/11/18 0600 05/11/18 0800  BP: (!) 126/57 138/69  (!) 151/59  Pulse: 72 75 74 68  Resp: 20 20 16 14   Temp:      TempSrc:      SpO2: 98% 95% 94% 100%    Intake/Output Summary (Last 24 hours) at 05/11/2018  0905 Last data filed at 05/11/2018 0800 Gross per 24 hour  Intake 343 ml  Output 2775 ml  Net -2432 ml   There were no vitals filed for this visit.  Examination:  General exam: Appears calm and comfortable ,Not in distress,average built.  On nasal cannula HEENT:PERRL,Oral mucosa moist, Ear/Nose normal on gross exam Respiratory system: Bilateral equal air entry, normal vesicular breath sounds, no wheezes or crackles  Cardiovascular system: S1 & S2 heard, RRR. No JVD, murmurs, rubs, gallops or clicks. No pedal edema. Gastrointestinal system: Abdomen is nondistended, soft and nontender. No organomegaly or masses felt. Normal bowel sounds heard.  Left-sided PCN, Foley catheter in place Central nervous system: Alert and oriented. No focal neurological deficits. Extremities: No edema, no clubbing ,no cyanosis, distal peripheral pulses palpable. Skin: No rashes, lesions or ulcers,no icterus ,no pallor MSK: Normal muscle bulk,tone ,power Psychiatry: Judgement and insight appear normal. Mood & affect appropriate.    Data Reviewed: I have personally reviewed following labs and imaging studies  CBC: Recent Labs  Lab 05/05/18 0201 05/07/18 0253 05/08/18 0253 05/08/18 2040 05/09/18 0312 05/10/18 0227 05/11/18 0427  WBC 10.7* 11.1* 10.0 6.5 15.9* 9.9 9.4  NEUTROABS 8.1* 9.7*  --   --  13.8* 7.3 6.4  HGB 11.5* 10.9* 9.9* 11.0* 9.3* 8.9* 9.5*  HCT 36.2 34.0* 31.1* 34.4* 29.2* 27.7* 29.8*  MCV 88.3 87.2 87.6 87.1 86.4 84.7 85.6  PLT 230 177 164 123* 148* 142* 017   Basic Metabolic Panel: Recent Labs  Lab 05/08/18 0253 05/08/18 0932 05/08/18 2040 05/09/18 0024 05/09/18 0312 05/10/18 0227 05/11/18 0427  NA 143 142 141 142  --  142 145  K 4.7 4.3 3.7 3.9  --  3.3* 3.2*  CL 115* 113* 108 111  --  105 103  CO2 19* 17* 20* 22  --  27 31  GLUCOSE 98 94 92 135*  --  112* 108*  BUN 31* 34* 32* 32*  --  30* 29*  CREATININE 3.39* 3.62* 3.46* 3.52*  --  3.33* 3.01*  CALCIUM 8.0* 7.8*  8.0* 7.8*  --  7.8* 8.4*  MG 1.7  --   --   --  1.8 1.9 1.7  PHOS 3.4  --   --   --  3.5 2.3* 4.5   GFR: Estimated Creatinine Clearance: 19.3 mL/min (A) (by C-G formula based on SCr of 3.01 mg/dL (H)). Liver Function Tests: Recent Labs  Lab 05/05/18 0201 05/07/18 0253 05/09/18 0312  AST 19 22 41  ALT 17 13* 22  ALKPHOS 58 65 123  BILITOT 0.5 1.3* 1.3*  PROT 7.2 6.5 5.6*  ALBUMIN 3.6 2.8* 2.0*   Recent Labs  Lab 05/05/18 0201  LIPASE 31   No results for input(s): AMMONIA in the last 168 hours. Coagulation Profile: Recent Labs  Lab 05/07/18 0601  INR 1.32   Cardiac Enzymes: Recent Labs  Lab 05/07/18 0253 05/09/18 0312 05/09/18 0915 05/09/18 1532 05/10/18 0227  CKTOTAL 176  --   --   --   --   TROPONINI  --  0.83* 0.44* 0.40* 0.23*   BNP (last 3 results) Recent Labs    01/28/18 1539  PROBNP 67  HbA1C: No results for input(s): HGBA1C in the last 72 hours. CBG: Recent Labs  Lab 05/07/18 0211 05/07/18 0952 05/08/18 0900 05/09/18 0725 05/10/18 0728  GLUCAP 146* 100* 96 128* 102*   Lipid Profile: No results for input(s): CHOL, HDL, LDLCALC, TRIG, CHOLHDL, LDLDIRECT in the last 72 hours. Thyroid Function Tests: No results for input(s): TSH, T4TOTAL, FREET4, T3FREE, THYROIDAB in the last 72 hours. Anemia Panel: No results for input(s): VITAMINB12, FOLATE, FERRITIN, TIBC, IRON, RETICCTPCT in the last 72 hours. Sepsis Labs: Recent Labs  Lab 05/07/18 0447 05/08/18 2040 05/09/18 0024 05/09/18 0312  LATICACIDVEN 0.93 3.0* 1.3 1.3    Recent Results (from the past 240 hour(s))  Blood Culture (routine x 2)     Status: None (Preliminary result)   Collection Time: 05/07/18  2:45 AM  Result Value Ref Range Status   Specimen Description BLOOD RIGHT ANTECUBITAL  Final   Special Requests   Final    BOTTLES DRAWN AEROBIC AND ANAEROBIC Blood Culture results may not be optimal due to an excessive volume of blood received in culture bottles   Culture   Final     NO GROWTH 3 DAYS Performed at [facility] [address], [place], [place] [address]    Report Status PENDING  Incomplete  Blood Culture (routine x 2)     Status: None (Preliminary result)   Collection Time: 05/07/18  3:10 AM  Result Value Ref Range Status   Specimen Description BLOOD RIGHT FOREARM  Final   Special Requests   Final    BOTTLES DRAWN AEROBIC AND ANAEROBIC Blood Culture adequate volume   Culture   Final    NO GROWTH 3 DAYS Performed at [facility], [place] [address], [place], [place] [address]    Report Status PENDING  Incomplete  Urine culture     Status: Abnormal   Collection Time: 05/07/18  4:50 AM  Result Value Ref Range Status   Specimen Description URINE, CATHETERIZED  Final   Special Requests   Final    NONE Performed at [facility], [place] [address] [place], [place] [address]    Culture >=100,000 COLONIES/mL ESCHERICHIA COLI (A)  Final   Report Status 05/09/2018 FINAL  Final   Organism ID, Bacteria ESCHERICHIA COLI (A)  Final      Susceptibility   Escherichia coli - MIC*    AMPICILLIN >=32 RESISTANT Resistant     CEFAZOLIN <=4 SENSITIVE Sensitive     CEFTRIAXONE <=1 SENSITIVE Sensitive     CIPROFLOXACIN >=4 RESISTANT Resistant     GENTAMICIN <=1 SENSITIVE Sensitive     IMIPENEM <=0.25 SENSITIVE Sensitive     NITROFURANTOIN <=16 SENSITIVE Sensitive     TRIMETH/SULFA >=320 RESISTANT Resistant     AMPICILLIN/SULBACTAM 16 INTERMEDIATE Intermediate     PIP/TAZO <=4 SENSITIVE Sensitive     Extended ESBL NEGATIVE Sensitive     * >=100,000 COLONIES/mL ESCHERICHIA COLI  MRSA PCR Screening     Status: None   Collection Time: 05/07/18  9:49 AM  Result Value Ref Range Status   MRSA by PCR NEGATIVE NEGATIVE Final    Comment:        The GeneXpert MRSA Assay (FDA approved for NASAL specimens only), is one component of a comprehensive MRSA colonization surveillance program. It is not intended to diagnose MRSA infection nor to guide  or monitor treatment for MRSA infections. Performed at [facility], [place] [address] [place], [place] [address]   Aerobic/Anaerobic  Culture (surgical/deep wound)     Status: None (Preliminary result)   Collection Time: 05/08/18  4:44 PM  Result Value Ref Range Status   Specimen Description URINE, RANDOM KIDNEY LEFT  Final   Special Requests NONE  Final   Gram Stain   Final    ABUNDANT WBC PRESENT, PREDOMINANTLY PMN MODERATE GRAM NEGATIVE RODS Performed at [facility], [address], [place], [place] [address]    Culture   Final    ABUNDANT ESCHERICHIA COLI NO ANAEROBES ISOLATED; CULTURE IN PROGRESS FOR 5 DAYS    Report Status PENDING  Incomplete   Organism ID, Bacteria ESCHERICHIA COLI  Final      Susceptibility   Escherichia coli - MIC*    AMPICILLIN >=32 RESISTANT Resistant     CEFAZOLIN <=4 SENSITIVE Sensitive     CEFTRIAXONE <=1 SENSITIVE Sensitive     CIPROFLOXACIN >=4 RESISTANT Resistant     GENTAMICIN <=1 SENSITIVE Sensitive     IMIPENEM <=0.25 SENSITIVE Sensitive     NITROFURANTOIN <=16 SENSITIVE Sensitive     TRIMETH/SULFA >=320 RESISTANT Resistant     AMPICILLIN/SULBACTAM 16 INTERMEDIATE Intermediate     PIP/TAZO <=4 SENSITIVE Sensitive     Extended ESBL NEGATIVE Sensitive     * ABUNDANT ESCHERICHIA COLI      Radiology Studies: No results found.   Scheduled Meds: . cycloSPORINE  1 drop Both Eyes BID  . heparin  5,000 Units Subcutaneous Q8H  . mouth rinse  15 mL Mouth Rinse BID  . sodium chloride flush  3 mL Intravenous Q12H   Continuous Infusions: . cefTRIAXone (ROCEPHIN)  IV Stopped (05/11/18 0829)     LOS: 4 days    Time spent: More than 50% of that time was spent in counseling and/or coordination of care.  Flora Lipps, MD [facility] Pager [phone]  If 7PM-7AM, please contact night-coverage [email or web address] Password [email or web address] 05/11/2018, 9:05 AM

## 2018-05-12 ENCOUNTER — Inpatient Hospital Stay (HOSPITAL_COMMUNITY): Payer: PPO

## 2018-05-12 DIAGNOSIS — R4182 Altered mental status, unspecified: Secondary | ICD-10-CM

## 2018-05-12 DIAGNOSIS — I10 Essential (primary) hypertension: Secondary | ICD-10-CM

## 2018-05-12 DIAGNOSIS — N132 Hydronephrosis with renal and ureteral calculous obstruction: Secondary | ICD-10-CM

## 2018-05-12 LAB — CBC WITH DIFFERENTIAL/PLATELET
ABS IMMATURE GRANULOCYTES: 0.3 10*3/uL — AB (ref 0.0–0.1)
BASOS PCT: 1 %
Basophils Absolute: 0 10*3/uL (ref 0.0–0.1)
Eosinophils Absolute: 0.4 10*3/uL (ref 0.0–0.7)
Eosinophils Relative: 4 %
HEMATOCRIT: 30.2 % — AB (ref 36.0–46.0)
Hemoglobin: 9.5 g/dL — ABNORMAL LOW (ref 12.0–15.0)
IMMATURE GRANULOCYTES: 3 %
LYMPHS ABS: 1.6 10*3/uL (ref 0.7–4.0)
Lymphocytes Relative: 18 %
MCH: 27.4 pg (ref 26.0–34.0)
MCHC: 31.5 g/dL (ref 30.0–36.0)
MCV: 87 fL (ref 78.0–100.0)
Monocytes Absolute: 1.3 10*3/uL — ABNORMAL HIGH (ref 0.1–1.0)
Monocytes Relative: 15 %
NEUTROS ABS: 5.3 10*3/uL (ref 1.7–7.7)
NEUTROS PCT: 59 %
PLATELETS: 202 10*3/uL (ref 150–400)
RBC: 3.47 MIL/uL — ABNORMAL LOW (ref 3.87–5.11)
RDW: 15.5 % (ref 11.5–15.5)
WBC: 8.9 10*3/uL (ref 4.0–10.5)

## 2018-05-12 LAB — BASIC METABOLIC PANEL
ANION GAP: 13 (ref 5–15)
BUN: 27 mg/dL — ABNORMAL HIGH (ref 6–20)
CHLORIDE: 101 mmol/L (ref 101–111)
CO2: 29 mmol/L (ref 22–32)
CREATININE: 2.88 mg/dL — AB (ref 0.44–1.00)
Calcium: 8.3 mg/dL — ABNORMAL LOW (ref 8.9–10.3)
GFR calc non Af Amer: 15 mL/min — ABNORMAL LOW (ref >60)
GFR, EST AFRICAN AMERICAN: 17 mL/min — AB (ref >60)
Glucose, Bld: 91 mg/dL (ref 65–99)
Potassium: 2.8 mmol/L — ABNORMAL LOW (ref 3.5–5.1)
SODIUM: 143 mmol/L (ref 135–145)

## 2018-05-12 LAB — GLUCOSE, CAPILLARY: GLUCOSE-CAPILLARY: 89 mg/dL (ref 65–99)

## 2018-05-12 LAB — CULTURE, BLOOD (ROUTINE X 2)
Culture: NO GROWTH
Culture: NO GROWTH
Special Requests: ADEQUATE

## 2018-05-12 LAB — MAGNESIUM: Magnesium: 1.7 mg/dL (ref 1.7–2.4)

## 2018-05-12 LAB — PHOSPHORUS: PHOSPHORUS: 3.6 mg/dL (ref 2.5–4.6)

## 2018-05-12 MED ORDER — POTASSIUM CHLORIDE CRYS ER 20 MEQ PO TBCR
40.0000 meq | EXTENDED_RELEASE_TABLET | ORAL | Status: AC
  Administered 2018-05-12 (×2): 40 meq via ORAL
  Filled 2018-05-12 (×2): qty 2

## 2018-05-12 NOTE — Progress Notes (Signed)
PROGRESS NOTE    [name]  YHC:[id number] DOB: January 23, 1946 DOA: 05/07/2018 PCP: Antony Contras, MD   Brief Narrative:  Miranda Hutchinson is a 72 y.o. female with medical history significant for hypertension, coronary artery disease status post CABG, ascending aortic aneurysm status post Bentall procedure in 2013, and recent increase in serum creatinine with hydronephrosis noted on recent CT, now presenting to the emergency department with confusion after being found on the floor at home.  Patient was seen in the emergency department a couple days ago with left flank pain, underwent CT stone study that revealed mild hydronephrosis with possible left UPJ stricture, but no obstructing stone identified.  She was referred to urology and was seen in the clinic on 05/06/2018 and reports being told that the CT findings are likely chronic.  She had persistent left flank pain and developed dysuria so she decided to come to the ED.   Upon arrival to the ED, patient was found to be febrile to 38.6 C, saturating well on room air, tachycardic in the 110s, and with normal blood pressure initially.  Noncontrast head CT was negative for acute intracranial abnormality and CT cervical spine is negative for acute fracture or subluxation.  Chest x-ray was notable for left basilar atelectasis.  EKG features a sinus tachycardia with RBBB and LPFB.  Chemistry panel was concerning for creatinine 2.58, up from 1.59 two days earlier and up from less than 1 in October.  CBC is notable for leukocytosis to 11,100 and a normocytic anemia with hemoglobin of 10.9.  Lactic acid was reassuringly normal.  Urinalysis returned and is consistent with infection.  Patient was started on vancomycin and Zosyn, and the patient was given a 1 L normal saline bolus. Patient was admitted to the stepdown unit for ongoing management of severe sepsis suspected secondary to UTI.  She did need vasopressors in the stepdown unit and was subsequently weaned  off.  Assessment & Plan:    Septic shock/Severe sepsis secondary to E. coli  -due to obstructive stricture -required vasopressors initially.   -Clinically improving, obstruction relieved -continue IV Rocephin -change to PO Abx in 24-48hrs  Ureteropelvic junction obstruction with left hydronephrosis status post PCN placement on 5/11.  Urology and interventional radiology following.   -s/p Left PCN on 5/11 -Will follow urology recommendations, needs further imaging -DC foley  E. coli UTI  -as above  AKI -due to sepsis/shock - and above hydro -improving, monitor, baseline creatinine around 1.5, peaked at 3.8  History of hypertension.  Patient required vasopressors.  Off vasopressors at this time.   -Will resume antihypertensives tomorrow  History of coronary artery disease status post CABG. -stable  History of ascending aortic aneurysm status post Bentall procedure.  - Stable at this time.  Deconditioning, debility.  Patient has been seen with physical therapy and recommend home health physical therapy  DVT prophylaxis: Heparin subcu  Code Status: Full code  Family Communication: No family members at bedside  Disposition Plan: Home with home health   Consultants: Critical care, urology, interventional radiology  Procedures: Status post left PCN on 05/08/2018, Foley catheter placement  Antimicrobials: Rocephin  Subjective: -feels better, no N/V, improving  Objective: Vitals:   05/11/18 1801 05/11/18 2123 05/11/18 2123 05/12/18 0506  BP: (!) 148/67 (!) 128/97 (!) 128/97 (!) 134/51  Pulse: 81 80 80 70  Resp: 12 18 18 16   Temp: 99.1 F (37.3 C) 98.7 F (37.1 C) 98.7 F (37.1 C) 98.2 F (36.8 C)  TempSrc:  Oral    SpO2: 95% 93% 93% 98%    Intake/Output Summary (Last 24 hours) at 05/12/2018 1203 Last data filed at 05/12/2018 0929 Gross per 24 hour  Intake 420 ml  Output 2080 ml  Net -1660 ml   There were no vitals filed for this  visit.  Examination: Gen: Awake, Alert, Oriented X 3, no distress HEENT: PERRLA, Neck supple, no JVD Lungs: Good air movement bilaterally, CTAB CVS: RRR,No Gallops,Rubs or new Murmurs Abd: soft, Non tender, non distended, BS present, left PCN with urine Extremities: trace edema Skin: no new rashes Psychiatry: Judgement and insight appear normal. Mood & affect appropriate.    Data Reviewed: I have personally reviewed following labs and imaging studies  CBC: Recent Labs  Lab 05/07/18 0253  05/08/18 2040 05/09/18 0312 05/10/18 0227 05/11/18 0427 05/12/18 0303  WBC 11.1*   < > 6.5 15.9* 9.9 9.4 8.9  NEUTROABS 9.7*  --   --  13.8* 7.3 6.4 5.3  HGB 10.9*   < > 11.0* 9.3* 8.9* 9.5* 9.5*  HCT 34.0*   < > 34.4* 29.2* 27.7* 29.8* 30.2*  MCV 87.2   < > 87.1 86.4 84.7 85.6 87.0  PLT 177   < > 123* 148* 142* 166 202   < > = values in this interval not displayed.   Basic Metabolic Panel: Recent Labs  Lab 05/08/18 0253  05/08/18 2040 05/09/18 0024 05/09/18 1443 05/10/18 0227 05/11/18 0427 05/12/18 0303  NA 143   < > 141 142  --  142 145 143  K 4.7   < > 3.7 3.9  --  3.3* 3.2* 2.8*  CL 115*   < > 108 111  --  105 103 101  CO2 19*   < > 20* 22  --  27 31 29   GLUCOSE 98   < > 92 135*  --  112* 108* 91  BUN 31*   < > 32* 32*  --  30* 29* 27*  CREATININE 3.39*   < > 3.46* 3.52*  --  3.33* 3.01* 2.88*  CALCIUM 8.0*   < > 8.0* 7.8*  --  7.8* 8.4* 8.3*  MG 1.7  --   --   --  1.8 1.9 1.7 1.7  PHOS 3.4  --   --   --  3.5 2.3* 4.5 3.6   < > = values in this interval not displayed.   GFR: Estimated Creatinine Clearance: 20.1 mL/min (A) (by C-G formula based on SCr of 2.88 mg/dL (H)). Liver Function Tests: Recent Labs  Lab 05/07/18 0253 05/09/18 0312  AST 22 41  ALT 13* 22  ALKPHOS 65 123  BILITOT 1.3* 1.3*  PROT 6.5 5.6*  ALBUMIN 2.8* 2.0*   No results for input(s): LIPASE, AMYLASE in the last 168 hours. No results for input(s): AMMONIA in the last 168 hours. Coagulation  Profile: Recent Labs  Lab 05/07/18 0601  INR 1.32   Cardiac Enzymes: Recent Labs  Lab 05/07/18 0253 05/09/18 0312 05/09/18 0915 05/09/18 1532 05/10/18 0227  CKTOTAL 176  --   --   --   --   TROPONINI  --  0.83* 0.44* 0.40* 0.23*   BNP (last 3 results) Recent Labs    01/28/18 1539  PROBNP 67   HbA1C: No results for input(s): HGBA1C in the last 72 hours. CBG: Recent Labs  Lab 05/08/18 0900 05/09/18 0725 05/10/18 0728 05/11/18 0748 05/12/18 0738  GLUCAP 96 128* 102* 93 89   Lipid Profile:  No results for input(s): CHOL, HDL, LDLCALC, TRIG, CHOLHDL, LDLDIRECT in the last 72 hours. Thyroid Function Tests: No results for input(s): TSH, T4TOTAL, FREET4, T3FREE, THYROIDAB in the last 72 hours. Anemia Panel: No results for input(s): VITAMINB12, FOLATE, FERRITIN, TIBC, IRON, RETICCTPCT in the last 72 hours. Sepsis Labs: Recent Labs  Lab 05/07/18 0447 05/08/18 2040 05/09/18 0024 05/09/18 0312  LATICACIDVEN 0.93 3.0* 1.3 1.3    Recent Results (from the past 240 hour(s))  Blood Culture (routine x 2)     Status: None (Preliminary result)   Collection Time: 05/07/18  2:45 AM  Result Value Ref Range Status   Specimen Description BLOOD RIGHT ANTECUBITAL  Final   Special Requests   Final    BOTTLES DRAWN AEROBIC AND ANAEROBIC Blood Culture results may not be optimal due to an excessive volume of blood received in culture bottles   Culture   Final    NO GROWTH 4 DAYS Performed at [facility] [address], [place], [place] [address]    Report Status PENDING  Incomplete  Blood Culture (routine x 2)     Status: None (Preliminary result)   Collection Time: 05/07/18  3:10 AM  Result Value Ref Range Status   Specimen Description BLOOD RIGHT FOREARM  Final   Special Requests   Final    BOTTLES DRAWN AEROBIC AND ANAEROBIC Blood Culture adequate volume   Culture   Final    NO GROWTH 4 DAYS Performed at [facility], [place] [address] [place], [place]  [address]    Report Status PENDING  Incomplete  Urine culture     Status: Abnormal   Collection Time: 05/07/18  4:50 AM  Result Value Ref Range Status   Specimen Description URINE, CATHETERIZED  Final   Special Requests   Final    NONE Performed at [facility], [place] [address] [place], [place] [address]    Culture >=100,000 COLONIES/mL ESCHERICHIA COLI (A)  Final   Report Status 05/09/2018 FINAL  Final   Organism ID, Bacteria ESCHERICHIA COLI (A)  Final      Susceptibility   Escherichia coli - MIC*    AMPICILLIN >=32 RESISTANT Resistant     CEFAZOLIN <=4 SENSITIVE Sensitive     CEFTRIAXONE <=1 SENSITIVE Sensitive     CIPROFLOXACIN >=4 RESISTANT Resistant     GENTAMICIN <=1 SENSITIVE Sensitive     IMIPENEM <=0.25 SENSITIVE Sensitive     NITROFURANTOIN <=16 SENSITIVE Sensitive     TRIMETH/SULFA >=320 RESISTANT Resistant     AMPICILLIN/SULBACTAM 16 INTERMEDIATE Intermediate     PIP/TAZO <=4 SENSITIVE Sensitive     Extended ESBL NEGATIVE Sensitive     * >=100,000 COLONIES/mL ESCHERICHIA COLI  MRSA PCR Screening     Status: None   Collection Time: 05/07/18  9:49 AM  Result Value Ref Range Status   MRSA by PCR NEGATIVE NEGATIVE Final    Comment:        The GeneXpert MRSA Assay (FDA approved for NASAL specimens only), is one component of a comprehensive MRSA colonization surveillance program. It is not intended to diagnose MRSA infection nor to guide or monitor treatment for MRSA infections. Performed at [facility], [place] [address] [place], [place] [address]   Aerobic/Anaerobic Culture (surgical/deep wound)     Status: None (Preliminary result)   Collection Time: 05/08/18  4:44 PM  Result Value Ref Range Status   Specimen Description URINE, RANDOM KIDNEY LEFT  Final   Special  Requests NONE  Final   Gram Stain   Final    ABUNDANT WBC PRESENT, PREDOMINANTLY PMN MODERATE GRAM NEGATIVE RODS Performed at [facility], [place] [address] [place], [place]  [address]    Culture   Final    ABUNDANT ESCHERICHIA COLI NO ANAEROBES ISOLATED; CULTURE IN PROGRESS FOR 5 DAYS    Report Status PENDING  Incomplete   Organism ID, Bacteria ESCHERICHIA COLI  Final      Susceptibility   Escherichia coli - MIC*    AMPICILLIN >=32 RESISTANT Resistant     CEFAZOLIN <=4 SENSITIVE Sensitive     CEFTRIAXONE <=1 SENSITIVE Sensitive     CIPROFLOXACIN >=4 RESISTANT Resistant     GENTAMICIN <=1 SENSITIVE Sensitive     IMIPENEM <=0.25 SENSITIVE Sensitive     NITROFURANTOIN <=16 SENSITIVE Sensitive     TRIMETH/SULFA >=320 RESISTANT Resistant     AMPICILLIN/SULBACTAM 16 INTERMEDIATE Intermediate     PIP/TAZO <=4 SENSITIVE Sensitive     Extended ESBL NEGATIVE Sensitive     * ABUNDANT ESCHERICHIA COLI      Radiology Studies: No results found.   Scheduled Meds: . cycloSPORINE  1 drop Both Eyes BID  . heparin  5,000 Units Subcutaneous Q8H  . mouth rinse  15 mL Mouth Rinse BID  . potassium chloride  40 mEq Oral Q2H  . sodium chloride flush  3 mL Intravenous Q12H   Continuous Infusions: . cefTRIAXone (ROCEPHIN)  IV Stopped (05/12/18 0917)     LOS: 5 days    Time spent: 54min,  More than 50% of that time was spent in counseling and/or coordination of care.  Domenic Polite, MD [facility] Page via [email or web address], password [email or web address]  If 7PM-7AM, please contact night-coverage [email or web address] Password [facility] 05/12/2018, 12:03 PM

## 2018-05-12 NOTE — Consult Note (Signed)
[facility] CM Primary Care Navigator  05/12/2018  [place] [birth year] [id number]   Met with patientat the bedsideto identify possible discharge needs. Patient verbalized having "persistent left abdominal pain and rolled to the floor, then was found by family member". Per MD note, patient presented with confusion after being found on the floor at home, that led to this admission. (severe sepsis secondary to UTI)  PatientendorsesDr.David Swayne with [facility] asherprimary care provider.   [facility].   Patientstatesthatshemanagesher ownmedications at [employer] use of "pill organizer" filled every 7 days.  Patient reports thatshehas been driving prior to admission buthad made arrangement that her close friend Dealer) will providetransportation to her doctors'appointmentsafter discharge.  Patient verbalized that she lives with her sister Meredith Mody- works). She mentioned that it had been pre- arranged for 3 of her friends/ church members will take turns in providing assistance with her care needs at home after discharge and her sister will assist when available.  Anticipatedplan for dischargeishomewith home health services pertherapy recommendation.  Patient voiced understanding to call primary care provider's office whenshereturns home,for a post discharge follow-up visit within 1- 2 weeksor sooner if needs arise.Patient letter (with PCP's contact number) was provided asareminder.  Explained topatientregardingTHN CM services available for health managementat homebutshedeniesanyneeds or concernsat this point. She expressedunderstandingto seek referral from primary care provider to [facility] care management ifnecessaryand deemed appropriate for anyservices in the future.  [facility] care management information was provided for  future needs thatshe may have.   Patienthowever,verbally agreedand optedforEMMIcalls tofollowup with herrecoveryat home.   Referral made for [facility] General calls after discharge.   For additional questions please contact:  Edwena Felty A. Danee Soller, BSN, RN-BC [facility] PRIMARY CARE Navigator Cell: [phone]

## 2018-05-12 NOTE — Progress Notes (Signed)
Referring Physician(s): Bell,E  Supervising Physician: Sandi Mariscal  Patient Status:  [facility] - In-pt  Chief Complaint:  Left hydronephrosis/UPJ obstruction  Subjective: Pt without new c/o   Allergies: Percocet [oxycodone-acetaminophen] and Asa [aspirin]  Medications: Prior to Admission medications   Medication Sig Start Date End Date Taking? Authorizing Provider  cycloSPORINE (RESTASIS) 0.05 % ophthalmic emulsion Place 1 drop into both eyes 2 (two) times daily.    Yes [provider]  losartan (COZAAR) 100 MG tablet Take 50 mg by mouth daily.   Yes [provider]  metoprolol tartrate (LOPRESSOR) 25 MG tablet Take 25 mg by mouth 2 (two) times daily.   Yes [provider]  Multiple Vitamins-Minerals (ALIVE WOMENS 50+ PO) Take 1 tablet by mouth daily.   Yes [provider]  rosuvastatin (CRESTOR) 40 MG tablet TAKE 1 TABLET(40 MG) BY MOUTH DAILY 10/19/17  Yes Dunn, Dayna N, PA-C     Vital Signs: BP (!) 134/51 (BP Location: Left Arm)   Pulse 70   Temp 98.2 F (36.8 C)   Resp 16   SpO2 98%   Physical Exam left PCN intact, insertion site ok, NT, output 1.4 liters yellow urine yesterday  Imaging: Dg Chest Port 1 View  Result Date: 05/08/2018 CLINICAL DATA:  72 year old female with altered mental status, left kidney obstruction status post percutaneous nephrostomy. Respiratory compromise. EXAM: PORTABLE CHEST 1 VIEW COMPARISON:  05/07/2018 chest radiographs and earlier. FINDINGS: Portable AP semi upright view at 1619 hours. Lower lung volumes with increasing left lung base opacity. Stable cardiomegaly and mediastinal contours. Prior CABG and cardiac valve replacement. Increased pulmonary vascularity, but no overt edema. No pneumothorax or pleural effusion identified. Paucity of bowel gas in the upper abdomen. IMPRESSION: 1. Lower lung volumes with decreasing ventilation at the left lung base, favor atelectasis. 2. Increased pulmonary vascular  congestion but no overt pulmonary edema at this time. Electronically Signed   By: Genevie Ann M.D.   On: 05/08/2018 16:40   Ir Nephrostomy Placement Left  Result Date: 05/08/2018 INDICATION: 72 year old female with a history of sepsis and left ureteral obstruction. EXAM: IR NEPHROSTOMY PLACEMENT LEFT COMPARISON:  None. MEDICATIONS: None ANESTHESIA/SEDATION: Fentanyl 100 mcg IV; Versed 2.0 mg IV Moderate Sedation Time:  14 minutes The patient was continuously monitored during the procedure by the interventional radiology nurse under my direct supervision. CONTRAST:  10 cc-administered into the collecting system(s) FLUOROSCOPY TIME:  Fluoroscopy Time: 0 minutes 36 seconds (3.8 mGy). COMPLICATIONS: None immediate. PROCEDURE: Informed written consent was obtained from the patient after a thorough discussion of the procedural risks, benefits and alternatives. All questions were addressed. Maximal Sterile Barrier Technique was utilized including caps, mask, sterile gowns, sterile gloves, sterile drape, hand hygiene and skin antiseptic. A timeout was performed prior to the initiation of the procedure. Patient positioned prone position on the fluoroscopy table. Ultrasound survey of the left flank was performed with images stored and sent to PACs. The patient was then prepped and draped in the usual sterile fashion. 1% lidocaine was used to anesthetize the skin and subcutaneous tissues for local anesthesia. A Chiba needle was then used to access a posterior inferior calyx with ultrasound guidance. With spontaneous urine returned through the needle, passage of an 018 micro wire into the collecting system was performed under fluoroscopy. A small incision was made with an 11 blade scalpel, and the needle was removed from the wire. An Accustick system was then advanced over the wire into the collecting system under fluoroscopy.  The metal stiffener and inner dilator were removed, and then a sample of fluid was aspirated through  the 4 French outer sheath. Bentson wire was passed into the collecting system and the sheath removed. Ten French dilation of the soft tissues was performed. Using modified Seldinger technique, a 10 French pigtail catheter drain was placed over the Bentson wire. Wire and inner stiffener removed, and the pigtail was formed in the collecting system. Small amount of contrast confirmed position of the catheter. Patient tolerated the procedure well and remained hemodynamically stable throughout. No complications were encountered and no significant blood loss encountered IMPRESSION: Status post left percutaneous nephrostomy. Signed, Dulcy Fanny. Earleen Newport, DO [facility] Electronically Signed   By: Corrie Mckusick D.O.   On: 05/08/2018 15:14    Labs:  CBC: Recent Labs    05/09/18 0312 05/10/18 0227 05/11/18 0427 05/12/18 0303  WBC 15.9* 9.9 9.4 8.9  HGB 9.3* 8.9* 9.5* 9.5*  HCT 29.2* 27.7* 29.8* 30.2*  PLT 148* 142* 166 202    COAGS: Recent Labs    05/07/18 0601  INR 1.32  APTT 36    BMP: Recent Labs    05/09/18 0024 05/10/18 0227 05/11/18 0427 05/12/18 0303  NA 142 142 145 143  K 3.9 3.3* 3.2* 2.8*  CL 111 105 103 101  CO2 22 27 31 29   GLUCOSE 135* 112* 108* 91  BUN 32* 30* 29* 27*  CALCIUM 7.8* 7.8* 8.4* 8.3*  CREATININE 3.52* 3.33* 3.01* 2.88*  GFRNONAA 12* 13* 14* 15*  GFRAA 14* 15* 17* 17*    LIVER FUNCTION TESTS: Recent Labs    04/14/18 1714 05/05/18 0201 05/07/18 0253 05/09/18 0312  BILITOT 0.8 0.5 1.3* 1.3*  AST 18 19 22  41  ALT 14 17 13* 22  ALKPHOS 70 58 65 123  PROT 8.4* 7.2 6.5 5.6*  ALBUMIN 3.6 3.6 2.8* 2.0*    Assessment and Plan: Pt with hx left hydronephrosis/UPJ obstruction/AKI/UTI; s/p left PCN 5/11; afebrile; WBC nl; hgb stable; creat 2.88 (3.01), K 2.8- replace; urine cx- e coli; cont current tx -plans as outlined by urology- Dr. Gloriann Loan   Electronically Signed: D. Rowe Robert,  PA-C 05/12/2018, 8:48 AM   I spent a total of 15 minutes at the the patient's bedside AND on the patient's hospital floor or unit, greater than 50% of which was counseling/coordinating care for left nephrostomy    Patient ID: Miranda Hutchinson, female   DOB: Oct 12, 1946, 72 y.o.   MRN: [id number]

## 2018-05-13 ENCOUNTER — Other Ambulatory Visit: Payer: Self-pay | Admitting: Urology

## 2018-05-13 DIAGNOSIS — Q6211 Congenital occlusion of ureteropelvic junction: Principal | ICD-10-CM

## 2018-05-13 DIAGNOSIS — Q6239 Other obstructive defects of renal pelvis and ureter: Secondary | ICD-10-CM

## 2018-05-13 LAB — CBC
HCT: 27.9 % — ABNORMAL LOW (ref 36.0–46.0)
Hemoglobin: 9 g/dL — ABNORMAL LOW (ref 12.0–15.0)
MCH: 27.7 pg (ref 26.0–34.0)
MCHC: 32.3 g/dL (ref 30.0–36.0)
MCV: 85.8 fL (ref 78.0–100.0)
PLATELETS: 231 10*3/uL (ref 150–400)
RBC: 3.25 MIL/uL — ABNORMAL LOW (ref 3.87–5.11)
RDW: 15.1 % (ref 11.5–15.5)
WBC: 8.7 10*3/uL (ref 4.0–10.5)

## 2018-05-13 LAB — AEROBIC/ANAEROBIC CULTURE W GRAM STAIN (SURGICAL/DEEP WOUND)

## 2018-05-13 LAB — GLUCOSE, CAPILLARY: Glucose-Capillary: 96 mg/dL (ref 65–99)

## 2018-05-13 LAB — BASIC METABOLIC PANEL
Anion gap: 9 (ref 5–15)
BUN: 22 mg/dL — AB (ref 6–20)
CHLORIDE: 105 mmol/L (ref 101–111)
CO2: 29 mmol/L (ref 22–32)
CREATININE: 2.33 mg/dL — AB (ref 0.44–1.00)
Calcium: 8.3 mg/dL — ABNORMAL LOW (ref 8.9–10.3)
GFR calc Af Amer: 23 mL/min — ABNORMAL LOW (ref >60)
GFR calc non Af Amer: 19 mL/min — ABNORMAL LOW (ref >60)
Glucose, Bld: 101 mg/dL — ABNORMAL HIGH (ref 65–99)
Potassium: 3.2 mmol/L — ABNORMAL LOW (ref 3.5–5.1)
Sodium: 143 mmol/L (ref 135–145)

## 2018-05-13 LAB — AEROBIC/ANAEROBIC CULTURE (SURGICAL/DEEP WOUND)

## 2018-05-13 MED ORDER — ACETAMINOPHEN 325 MG PO TABS: 650.0000 mg | ORAL_TABLET | Freq: Four times a day (QID) | ORAL | Status: AC | PRN

## 2018-05-13 MED ORDER — CEPHALEXIN 500 MG PO CAPS: 500.0000 mg | ORAL_CAPSULE | Freq: Three times a day (TID) | ORAL | 0 refills | Status: DC

## 2018-05-13 MED ORDER — POTASSIUM CHLORIDE CRYS ER 20 MEQ PO TBCR
40.0000 meq | EXTENDED_RELEASE_TABLET | Freq: Once | ORAL | Status: AC
  Administered 2018-05-13: 40 meq via ORAL
  Filled 2018-05-13: qty 2

## 2018-05-13 MED ORDER — CEPHALEXIN 500 MG PO CAPS
500.0000 mg | ORAL_CAPSULE | Freq: Three times a day (TID) | ORAL | Status: DC
  Administered 2018-05-13: 500 mg via ORAL
  Filled 2018-05-13: qty 1

## 2018-05-13 NOTE — Progress Notes (Signed)
Pt's ride arrived, pt escorted to exit via wheelchair, and pt D/C'd home via private auto.

## 2018-05-13 NOTE — Progress Notes (Signed)
Miranda Hutchinson to be D/C'd Home per MD order.  Discussed with the patient and all questions fully answered.  VSS, Skin clean, dry and intact without evidence of skin break down, no evidence of skin tears noted. IV catheter discontinued intact. Site without signs and symptoms of complications. Dressing and pressure applied.  An After Visit Summary was printed and given to the patient. Patient received prescription.  D/c education completed with patient/family including follow up instructions, medication list, d/c activities limitations if indicated, with other d/c instructions as indicated by MD - patient able to verbalize understanding, all questions fully answered.   Patient instructed to return to ED, call 911, or call MD for any changes in condition.   Patient waiting on her friend to come pick her up. Will continue to assess.  Christoper Fabian Cleve Paolillo 05/13/2018 11:49 AM

## 2018-05-13 NOTE — Care Management Note (Addendum)
Case Management Note  Patient Details  Name: Miranda Hutchinson MRN: [id number] Date of Birth: [birth year]  Subjective/Objective:   Admitted with severe sepsis, Hx of hypertension, CAD/CABG, ascending aortic aneurysm/Bentall procedure 2013, and hydronephrosis. Resides with sister and grandson.  Sherley Bounds (Daughter) Demitri Randol Kern [phone]        PCP: Antony Contras                Action/Plan: Transition to home with home health services to follow.  Pt will f/u with IR / Dr. Gloriann Loan regarding PCN tube. Office to call and setup appointment time. No specific care orders given per IR MD.  Pt states has transportation to home.  Expected Discharge Date:   05/13/2018           Expected Discharge Plan:  [place]  In-House Referral:     Discharge planning Services  CM Consult  Post Acute Care Choice:    Choice offered to:  Patient  DME Arranged:    DME Agency:   pt refused walker ordered, states will use husband's old walker @ home  HH Arranged:  PT,RN [place]:    [facility] [place]  Status of Service:  COMPLETED  If discussed at [place] of Stay Meetings, dates discussed:    Additional Comments:  Sharin Mons, RN 05/13/2018, 9:02 AM

## 2018-05-13 NOTE — Progress Notes (Signed)
Physical Therapy Treatment Patient Details Name: Miranda Hutchinson MRN: [id number] DOB: 02/01/1946 Today's Date: 05/13/2018    History of Present Illness 72 y.o. female with PMH as outlined below. She was brought to [facility] ED 05/06/18 after being found down on the floor at home with confusion. CXR showed left basilar atelectasis.  She had CT stone study that showed mild hydro with possible left UPJ stricture; however, no obstructing stone noted.  She was also found to have hypotension. She was given 4L IVF; however, MAPs remained in mid 50's. Found to have sepsis, likely due to UTI    PT Comments    Patient is making good progress with PT.  From a mobility standpoint anticipate patient will be ready for DC home when medically ready.    Follow Up Recommendations  Home health PT;Supervision for mobility/OOB     Equipment Recommendations  Rolling walker with 5" wheels    Recommendations for Other Services       Precautions / Restrictions Precautions Precautions: Fall Restrictions Weight Bearing Restrictions: No    Mobility  Bed Mobility               General bed mobility comments: pt standing up in room upon arrival  Transfers Overall transfer level: Modified independent Equipment used: Rolling walker (2 wheeled) Transfers: Sit to/from Stand              Ambulation/Gait Ambulation/Gait assistance: Modified independent (Device/Increase time) Ambulation Distance (Feet): 250 Feet Assistive device: Rolling walker (2 wheeled) Gait Pattern/deviations: Step-through pattern     General Gait Details: slow, steady gait   Stairs Stairs: Yes Stairs assistance: Min guard Stair Management: No rails;Step to pattern;Forwards Number of Stairs: 3 General stair comments: min guard for safety   Wheelchair Mobility    Modified Rankin (Stroke Patients Only)       Balance Overall balance assessment: Needs assistance Sitting-balance support: Feet supported;No upper  extremity supported Sitting balance-Leahy Scale: Good     Standing balance support: No upper extremity supported;During functional activity Standing balance-Leahy Scale: Fair Standing balance comment: pt reaches for objects to hold onto when ambulating without AD; "furniture walking" in room                             Cognition Arousal/Alertness: Awake/alert Behavior During Therapy: [facility] for tasks assessed/performed Overall Cognitive Status: Within Functional Limits for tasks assessed                                        Exercises      General Comments        Pertinent Vitals/Pain Pain Assessment: No/denies pain    Home Living                      Prior Function            PT Goals (current goals can now be found in the care plan section) Acute Rehab PT Goals Patient Stated Goal: go home and be less fatigued with activity PT Goal Formulation: With patient Time For Goal Achievement: 05/24/18 Potential to Achieve Goals: Good Progress towards PT goals: Progressing toward goals    Frequency    Min 3X/week      PT Plan Current plan remains appropriate    Co-evaluation  AM-PAC PT "6 Clicks" Daily Activity  Outcome Measure  Difficulty turning over in bed (including adjusting bedclothes, sheets and blankets)?: None Difficulty moving from lying on back to sitting on the side of the bed? : A Little Difficulty sitting down on and standing up from a chair with arms (e.g., wheelchair, bedside commode, etc,.)?: A Little Help needed moving to and from a bed to chair (including a wheelchair)?: A Little Help needed walking in hospital room?: A Little Help needed climbing 3-5 steps with a railing? : A Little 6 Click Score: 19    End of Session Equipment Utilized During Treatment: Gait belt Activity Tolerance: Patient tolerated treatment well Patient left: with call bell/phone within reach;in bed;with  family/visitor present(pt sitting EOB ) Nurse Communication: Mobility status PT Visit Diagnosis: Unsteadiness on feet (R26.81);Other abnormalities of gait and mobility (R26.89);Muscle weakness (generalized) (M62.81);Difficulty in walking, not elsewhere classified (R26.2)     Time: 5883-2549 PT Time Calculation (min) (ACUTE ONLY): 17 min  Charges:  $Gait Training: 8-22 mins                    G Codes:       Earney Navy, PTA Pager: [phone]     Darliss Cheney 05/13/2018, 2:11 PM

## 2018-05-13 NOTE — Care Management Important Message (Signed)
Important Message  Patient Details  Name: Miranda Hutchinson MRN: [id number] Date of Birth: May 11, 1946   Medicare Important Message Given:  Yes    Samah Lapiana Montine Circle 05/13/2018, 3:23 PM

## 2018-05-13 NOTE — Discharge Summary (Signed)
Physician Discharge Summary  Miranda Hutchinson OVF:[id number] DOB: [birth year] DOA: 05/07/2018  PCP: Antony Contras, MD  Admit date: 05/07/2018 Discharge date: 05/13/2018  Time spent: 45 minutes  Recommendations for Outpatient Follow-up:  1. Urology Dr.Eugene Gloriann Loan in 2weeks for definitive management of Left UPJ obstrcution   Discharge Diagnoses:  Principal Problem:   Severe sepsis ([name])   Left hydronephrosis   Left UPJ obstruction   Coronary artery disease   Essential hypertension, benign   AKI (acute kidney injury) ([name])   Sepsis secondary to UTI ([name])   Septic shock ([name])   Discharge Condition: improved  Diet recommendation: heart healthy  There were no vitals filed for this visit.  History of present illness:  Miranda Hutchinson a 72 y.o.femalewith medical history significant forhypertension, coronary artery disease status post CABG, ascending aortic aneurysm status post Bentall procedure in 2013, and recent increase in serum creatinine with hydronephrosis noted on recent CT, now presenting to the emergency department with confusion after being found on the floor at home. Patient was seen in the emergency department a couple days ago with left flank pain, underwent CT stone study that revealed mild hydronephrosis with possible left UPJ stricture, but no obstructing stone identified. She was referred to urology and was seen in the clinic on 05/06/2018 and reports being told that the CT findings are likely chronic. She had persistent left flank pain and developed dysuria so she decided to come to the ED    Hospital Course:   Septic shock/Severe sepsis secondary to E. coli  -due to obstructive stricture -required vasopressors initially.   -Clinically improving, obstruction relieved with left percutaneous nephrostomy -clinically improved, treated with IV Rocephin and then transitioned to oral Keflex -continue antibiotics for 4 more days to complete a ten-day  course  Ureteropelvic junction obstruction with left hydronephrosis status post PCN placement on 5/11.  Urology and interventional radiology consulted -s/p Left PCN on 5/11 -Foley discontinued -followed by urology this admission and they recommended to continue nephrostomy tube at discharge and follow-up with them in the office in 2 weeks will eventually need a renal scan with nephrostomy capped to evaluate degree of obstruction and renal function on that side. -notify Dr. Gloriann Loan of discharge today his office will call patient in 2 weeks for follow-up appointment  E. coli UTI  -as above  AKI -due to sepsis/shock - and above hydro -improving, monitor, baseline creatinine around 1.5, peaked at 3.8 -creatinine improving it is 2.3 at discharge today, will be discontinued at discharge  History of hypertension.   -required vasopressors initially, subsequently discontinued improved and stable now blood pressure in the 120 to 1:30 range without antihypertensives, ARB discontinued due to improving AKI -monitor BP a follow-up  History of coronary artery disease status post CABG. -stable  History of ascending aortic aneurysm status post Bentall procedure.  -follow-up with surgeon  Deconditioning, debility.  Patient has been seen with physical therapy and recommend home health physical therapy  Consultants: Critical care, urology, interventional radiology  Procedures: Status post left PCN on 05/08/2018, Foley catheter placement  Antimicrobials: Rocephin     Discharge Exam: Vitals:   05/12/18 2227 05/13/18 0603  BP: 137/67 (!) 138/58  Pulse: 77 68  Resp:  17  Temp: 98.3 F (36.8 C) 98.5 F (36.9 C)  SpO2: 96% 96%    General: AAOx3 Cardiovascular: S1S2/RRR Respiratory: CTAB  Discharge Instructions   Discharge Instructions    Diet - low sodium heart healthy   Complete by:  As directed  Increase activity slowly   Complete by:  As directed      Allergies as of  05/13/2018      Reactions   Percocet [oxycodone-acetaminophen] Other (See Comments)   Hallucinations    Asa [aspirin] Nausea Only, Other (See Comments)   "hurts my stomach"      Medication List    STOP taking these medications   losartan 100 MG tablet Commonly known as:  COZAAR     TAKE these medications   acetaminophen 325 MG tablet Commonly known as:  TYLENOL Take 2 tablets (650 mg total) by mouth every 6 (six) hours as needed for fever (temp >101).   ALIVE WOMENS 50+ PO Take 1 tablet by mouth daily.   cephALEXin 500 MG capsule Commonly known as:  KEFLEX Take 1 capsule (500 mg total) by mouth every 8 (eight) hours. For 4days   cycloSPORINE 0.05 % ophthalmic emulsion Commonly known as:  RESTASIS Place 1 drop into both eyes 2 (two) times daily.   metoprolol tartrate 25 MG tablet Commonly known as:  LOPRESSOR Take 25 mg by mouth 2 (two) times daily.   rosuvastatin 40 MG tablet Commonly known as:  CRESTOR TAKE 1 TABLET(40 MG) BY MOUTH DAILY      Allergies  Allergen Reactions  . Percocet [Oxycodone-Acetaminophen] Other (See Comments)    Hallucinations   . Asa [Aspirin] Nausea Only and Other (See Comments)    "hurts my stomach"   Follow-up Information    Antony Contras, MD. Schedule an appointment as soon as possible for a visit in 1 week(s).   Specialty:  Family Medicine Contact information: [address] [place] [address] [phone]        Lucas Mallow, MD Follow up in 2 week(s).   Specialty:  Urology Why:  office will call you with appointment Contact information: [place] [phone]            The results of significant diagnostics from this hospitalization (including imaging, microbiology, ancillary and laboratory) are listed below for reference.    Significant Diagnostic Studies: Dg Chest 2 View  Result Date: 05/12/2018 CLINICAL DATA:  Chest pain. History of coronary artery  disease and ascending aortic aneurysm with aortic valve replacement. EXAM: CHEST - 2 VIEW COMPARISON:  Portable chest x-ray of May 08, 2018 FINDINGS: The lungs are adequately inflated. There is a trace of fluid in the left lateral and posterior costophrenic angles. There is no alveolar infiltrate or pleural effusion. The cardiac silhouette is mildly enlarged. The pulmonary vascularity is not engorged. The patient has undergone previous CABG and aortic valve replacement. There is calcification in the wall of the aortic arch. The bony thorax is unremarkable. IMPRESSION: Trace of fluid at the left lung base. No pulmonary edema or pneumonia. Mild enlargement of the cardiac silhouette without pulmonary vascular congestion. Thoracic aortic atherosclerosis. Electronically Signed   By: David  Martinique M.D.   On: 05/12/2018 12:20   Ct Head Wo Contrast  Result Date: 05/07/2018 CLINICAL DATA:  Patient found on the floor at home, incoherent and confused. EXAM: CT HEAD WITHOUT CONTRAST CT CERVICAL SPINE WITHOUT CONTRAST TECHNIQUE: Multidetector CT imaging of the head and cervical spine was performed following the standard protocol without intravenous contrast. Multiplanar CT image reconstructions of the cervical spine were also generated. COMPARISON:  None FINDINGS: CT HEAD FINDINGS Brain: Normal involutional changes of the brain for age. No large vascular territory infarct, hemorrhage, midline shift or edema.  No extra-axial fluid collections. No intra-axial mass. Vascular: No hyperdense vessel sign. Skull: Intact skull without acute nor suspicious osseous abnormality. Sinuses/Orbits: Mild ethmoid sinus mucosal thickening. No acute paranasal sinus disease. Clear mastoids. Intact orbits and globes. Other: None CT CERVICAL SPINE FINDINGS Alignment: Maintained cervical lordosis Skull base and vertebrae: No acute fracture. No aggressive osseous lesions. Probable subchondral cyst of the C4 vertebral body. Soft tissues and spinal  canal: No prevertebral fluid or swelling. No visible canal hematoma. Disc levels: Moderate disc flattening at C5-6 with small posterior marginal osteophytes and uncinate spurring. No significant central canal stenosis. Minimal C5-6 neural foraminal encroachment on the left from uncinate spurs. Multilevel mild degenerative facet arthropathy. Upper chest: Negative. Other: None IMPRESSION: 1. Normal head CT for age. 2. Degenerative disc disease C5-6. No acute cervical spine fracture or posttraumatic listhesis. Electronically Signed   By: Ashley Royalty M.D.   On: 05/07/2018 02:55   Ct Cervical Spine Wo Contrast  Result Date: 05/07/2018 CLINICAL DATA:  Patient found on the floor at home, incoherent and confused. EXAM: CT HEAD WITHOUT CONTRAST CT CERVICAL SPINE WITHOUT CONTRAST TECHNIQUE: Multidetector CT imaging of the head and cervical spine was performed following the standard protocol without intravenous contrast. Multiplanar CT image reconstructions of the cervical spine were also generated. COMPARISON:  None FINDINGS: CT HEAD FINDINGS Brain: Normal involutional changes of the brain for age. No large vascular territory infarct, hemorrhage, midline shift or edema. No extra-axial fluid collections. No intra-axial mass. Vascular: No hyperdense vessel sign. Skull: Intact skull without acute nor suspicious osseous abnormality. Sinuses/Orbits: Mild ethmoid sinus mucosal thickening. No acute paranasal sinus disease. Clear mastoids. Intact orbits and globes. Other: None CT CERVICAL SPINE FINDINGS Alignment: Maintained cervical lordosis Skull base and vertebrae: No acute fracture. No aggressive osseous lesions. Probable subchondral cyst of the C4 vertebral body. Soft tissues and spinal canal: No prevertebral fluid or swelling. No visible canal hematoma. Disc levels: Moderate disc flattening at C5-6 with small posterior marginal osteophytes and uncinate spurring. No significant central canal stenosis. Minimal C5-6 neural  foraminal encroachment on the left from uncinate spurs. Multilevel mild degenerative facet arthropathy. Upper chest: Negative. Other: None IMPRESSION: 1. Normal head CT for age. 2. Degenerative disc disease C5-6. No acute cervical spine fracture or posttraumatic listhesis. Electronically Signed   By: Ashley Royalty M.D.   On: 05/07/2018 02:55   US Renal  Result Date: 05/07/2018 CLINICAL DATA:  Acute kidney injury. EXAM: RENAL / URINARY TRACT ULTRASOUND COMPLETE COMPARISON:  CT scan of May 05, 2018. FINDINGS: Right Kidney: Length: 11.2 cm. Echogenicity within normal limits. No mass visualized. Mild right hydronephrosis is noted. Left Kidney: Length: 13.5 cm. Echogenicity within normal limits. No mass visualized. Moderate hydronephrosis is noted. Bladder: Appears normal for degree of bladder distention. Right ureteral jet is visualized. Left ureteral jet is not visualized. IMPRESSION: Mild right hydronephrosis.  Moderate left hydronephrosis is noted. Electronically Signed   By: Marijo Conception, M.D.   On: 05/07/2018 07:31   Dg Chest Port 1 View  Result Date: 05/08/2018 CLINICAL DATA:  72 year old female with altered mental status, left kidney obstruction status post percutaneous nephrostomy. Respiratory compromise. EXAM: PORTABLE CHEST 1 VIEW COMPARISON:  05/07/2018 chest radiographs and earlier. FINDINGS: Portable AP semi upright view at 1619 hours. Lower lung volumes with increasing left lung base opacity. Stable cardiomegaly and mediastinal contours. Prior CABG and cardiac valve replacement. Increased pulmonary vascularity, but no overt edema. No pneumothorax or pleural effusion identified. Paucity of bowel gas in the  upper abdomen. IMPRESSION: 1. Lower lung volumes with decreasing ventilation at the left lung base, favor atelectasis. 2. Increased pulmonary vascular congestion but no overt pulmonary edema at this time. Electronically Signed   By: Genevie Ann M.D.   On: 05/08/2018 16:40   Dg Chest Port 1  View  Result Date: 05/07/2018 CLINICAL DATA:  72 year old female with fever and sepsis. EXAM: PORTABLE CHEST 1 VIEW COMPARISON:  Chest radiograph dated 09/06/2012 FINDINGS: Minimal left lung base atelectasis. Infiltrate is less likely. There is no focal consolidation, pleural effusion, or pneumothorax. Mild cardiomegaly. Median sternotomy wires and mechanical cardiac valve. Atherosclerotic calcification of the aortic arch. No acute osseous pathology. IMPRESSION: Left lung base atelectatic changes. Infiltrate is not excluded. Clinical correlation is recommended. No focal consolidation. Electronically Signed   By: Anner Crete M.D.   On: 05/07/2018 02:47   Ct Renal Stone Study  Result Date: 05/05/2018 CLINICAL DATA:  72 year old female with left flank pain. Concern for stone. EXAM: CT ABDOMEN AND PELVIS WITHOUT CONTRAST TECHNIQUE: Multidetector CT imaging of the abdomen and pelvis was performed following the standard protocol without IV contrast. COMPARISON:  None. FINDINGS: Evaluation of this exam is limited in the absence of intravenous contrast. Lower chest: Minimal bibasilar atelectatic changes. The visualized lung bases are otherwise clear. There is coronary vascular calcification and mechanical aortic valve. No intra-abdominal free air or free fluid. Hepatobiliary: The liver is unremarkable. There is layering stone within the gallbladder. No pericholecystic fluid. No intrahepatic biliary ductal dilatation. Pancreas: Unremarkable. No pancreatic ductal dilatation or surrounding inflammatory changes. Spleen: Normal in size without focal abnormality. Adrenals/Urinary Tract: The adrenal glands are unremarkable. There is a nonobstructing stone or 2 adjacent stones with combined diameter of 8 mm in the inferior pole of the left kidney. There is mild left hydronephrosis with transition at the ureteropelvic junction. No obstructing stone identified. A UPJ stricture or urothelial lesion is not entirely excluded.  CT urogram may provide better evaluation. There is mild fullness of the right renal collecting system. No stone identified. A 3 mm calcific focus along the course of the distal right ureter (series 2, image 82) may represent a focus of vascular calcification. The urinary bladder is grossly unremarkable for the degree of distention. Stomach/Bowel: There is a small hiatal hernia. There is extensive sigmoid diverticulosis with muscular hypertrophy. No active inflammatory changes. The appendix is normal. Vascular/Lymphatic: Moderate aortoiliac atherosclerotic disease. The aorta is ectatic measuring up to 2.4 cm. There is 2.1 cm aneurysmal dilatation of the right common iliac artery. No portal venous gas. There is no adenopathy. Reproductive: Hysterectomy.  No pelvic mass. Other: None Musculoskeletal: Degenerative changes of the spine. No acute osseous pathology. IMPRESSION: 1. Mild left hydronephrosis likely secondary to stricture at the left UPJ. No obstructing stone identified. There is an 8 mm nonobstructing left renal inferior pole calculus. CT urography or direct visualization may provide better evaluation of the nature of obstruction at the level of the UPJ and exclude a urothelial lesion. There is also a mild right hydronephrosis. 2. Extensive sigmoid and colonic diverticulosis. No active inflammatory changes. No bowel obstruction. Normal appendix. 3. Cholelithiasis. Electronically Signed   By: Anner Crete M.D.   On: 05/05/2018 03:38   Ir Nephrostomy Placement Left  Result Date: 05/08/2018 INDICATION: 72 year old female with a history of sepsis and left ureteral obstruction. EXAM: IR NEPHROSTOMY PLACEMENT LEFT COMPARISON:  None. MEDICATIONS: None ANESTHESIA/SEDATION: Fentanyl 100 mcg IV; Versed 2.0 mg IV Moderate Sedation Time:  14 minutes The patient was  continuously monitored during the procedure by the interventional radiology nurse under my direct supervision. CONTRAST:  10 cc-administered into the  collecting system(s) FLUOROSCOPY TIME:  Fluoroscopy Time: 0 minutes 36 seconds (3.8 mGy). COMPLICATIONS: None immediate. PROCEDURE: Informed written consent was obtained from the patient after a thorough discussion of the procedural risks, benefits and alternatives. All questions were addressed. Maximal Sterile Barrier Technique was utilized including caps, mask, sterile gowns, sterile gloves, sterile drape, hand hygiene and skin antiseptic. A timeout was performed prior to the initiation of the procedure. Patient positioned prone position on the fluoroscopy table. Ultrasound survey of the left flank was performed with images stored and sent to PACs. The patient was then prepped and draped in the usual sterile fashion. 1% lidocaine was used to anesthetize the skin and subcutaneous tissues for local anesthesia. A Chiba needle was then used to access a posterior inferior calyx with ultrasound guidance. With spontaneous urine returned through the needle, passage of an 018 micro wire into the collecting system was performed under fluoroscopy. A small incision was made with an 11 blade scalpel, and the needle was removed from the wire. An Accustick system was then advanced over the wire into the collecting system under fluoroscopy. The metal stiffener and inner dilator were removed, and then a sample of fluid was aspirated through the 4 French outer sheath. Bentson wire was passed into the collecting system and the sheath removed. Ten French dilation of the soft tissues was performed. Using modified Seldinger technique, a 10 French pigtail catheter drain was placed over the Bentson wire. Wire and inner stiffener removed, and the pigtail was formed in the collecting system. Small amount of contrast confirmed position of the catheter. Patient tolerated the procedure well and remained hemodynamically stable throughout. No complications were encountered and no significant blood loss encountered IMPRESSION: Status post left  percutaneous nephrostomy. Signed, Dulcy Fanny. Earleen [place], DO [facility] Radiology Electronically Signed   By: Corrie Mckusick D.O.   On: 05/08/2018 15:14    Microbiology: Recent Results (from the past 240 hour(s))  Blood Culture (routine x 2)     Status: None   Collection Time: 05/07/18  2:45 AM  Result Value Ref Range Status   Specimen Description BLOOD RIGHT ANTECUBITAL  Final   Special Requests   Final    BOTTLES DRAWN AEROBIC AND ANAEROBIC Blood Culture results may not be optimal due to an excessive volume of blood received in culture bottles   Culture   Final    NO GROWTH 5 DAYS Performed at [facility], [place] [address], [place], [place] [address]    Report Status 05/12/2018 FINAL  Final  Blood Culture (routine x 2)     Status: None   Collection Time: 05/07/18  3:10 AM  Result Value Ref Range Status   Specimen Description BLOOD RIGHT FOREARM  Final   Special Requests   Final    BOTTLES DRAWN AEROBIC AND ANAEROBIC Blood Culture adequate volume   Culture   Final    NO GROWTH 5 DAYS Performed at [facility], [place] [address] [place], [place] [address]    Report Status 05/12/2018 FINAL  Final  Urine culture     Status: Abnormal   Collection Time: 05/07/18  4:50 AM  Result Value Ref Range Status   Specimen Description URINE, CATHETERIZED  Final   Special Requests   Final    NONE Performed at [facility], [place] [address]  [place], [place] [address]    Culture >=100,000 COLONIES/mL ESCHERICHIA COLI (A)  Final   Report Status 05/09/2018 FINAL  Final   Organism ID, Bacteria ESCHERICHIA COLI (A)  Final      Susceptibility   Escherichia coli - MIC*    AMPICILLIN >=32 RESISTANT Resistant     CEFAZOLIN <=4 SENSITIVE Sensitive     CEFTRIAXONE <=1 SENSITIVE Sensitive     CIPROFLOXACIN >=4 RESISTANT Resistant     GENTAMICIN <=1 SENSITIVE Sensitive     IMIPENEM <=0.25 SENSITIVE Sensitive     NITROFURANTOIN <=16 SENSITIVE  Sensitive     TRIMETH/SULFA >=320 RESISTANT Resistant     AMPICILLIN/SULBACTAM 16 INTERMEDIATE Intermediate     PIP/TAZO <=4 SENSITIVE Sensitive     Extended ESBL NEGATIVE Sensitive     * >=100,000 COLONIES/mL ESCHERICHIA COLI  MRSA PCR Screening     Status: None   Collection Time: 05/07/18  9:49 AM  Result Value Ref Range Status   MRSA by PCR NEGATIVE NEGATIVE Final    Comment:        The GeneXpert MRSA Assay (FDA approved for NASAL specimens only), is one component of a comprehensive MRSA colonization surveillance program. It is not intended to diagnose MRSA infection nor to guide or monitor treatment for MRSA infections. Performed at [facility], [place] [address] [place], [place] [address]   Aerobic/Anaerobic Culture (surgical/deep wound)     Status: None (Preliminary result)   Collection Time: 05/08/18  4:44 PM  Result Value Ref Range Status   Specimen Description URINE, RANDOM KIDNEY LEFT  Final   Special Requests NONE  Final   Gram Stain   Final    ABUNDANT WBC PRESENT, PREDOMINANTLY PMN MODERATE GRAM NEGATIVE RODS Performed at [facility], [place] [address] [place],  [address]    Culture   Final    ABUNDANT ESCHERICHIA COLI NO ANAEROBES ISOLATED; CULTURE IN PROGRESS FOR 5 DAYS    Report Status PENDING  Incomplete   Organism ID, Bacteria ESCHERICHIA COLI  Final      Susceptibility   Escherichia coli - MIC*    AMPICILLIN >=32 RESISTANT Resistant     CEFAZOLIN <=4 SENSITIVE Sensitive     CEFTRIAXONE <=1 SENSITIVE Sensitive     CIPROFLOXACIN >=4 RESISTANT Resistant     GENTAMICIN <=1 SENSITIVE Sensitive     IMIPENEM <=0.25 SENSITIVE Sensitive     NITROFURANTOIN <=16 SENSITIVE Sensitive     TRIMETH/SULFA >=320 RESISTANT Resistant     AMPICILLIN/SULBACTAM 16 INTERMEDIATE Intermediate     PIP/TAZO <=4 SENSITIVE Sensitive     Extended ESBL NEGATIVE Sensitive     * ABUNDANT ESCHERICHIA COLI     Labs: Basic Metabolic Panel: Recent Labs  Lab  05/08/18 0253  05/09/18 0024 05/09/18 2536 05/10/18 0227 05/11/18 0427 05/12/18 0303 05/12/18 2355  NA 143   < > 142  --  142 145 143 143  K 4.7   < > 3.9  --  3.3* 3.2* 2.8* 3.2*  CL 115*   < > 111  --  105 103 101 105  CO2 19*   < > 22  --  27 31 29 29   GLUCOSE 98   < > 135*  --  112* 108* 91 101*  BUN 31*   < > 32*  --  30* 29* 27* 22*  CREATININE 3.39*   < > 3.52*  --  3.33* 3.01* 2.88* 2.33*  CALCIUM 8.0*   < > 7.8*  --  7.8* 8.4* 8.3* 8.3*  MG 1.7  --   --  1.8 1.9 1.7 1.7  --   PHOS 3.4  --   --  3.5 2.3* 4.5 3.6  --    < > = values in this interval not displayed.   Liver Function Tests: Recent Labs  Lab 05/07/18 0253 05/09/18 0312  AST 22 41  ALT 13* 22  ALKPHOS 65 123  BILITOT 1.3* 1.3*  PROT 6.5 5.6*  ALBUMIN 2.8* 2.0*   No results for input(s): LIPASE, AMYLASE in the last 168 hours. No results for input(s): AMMONIA in the last 168 hours. CBC: Recent Labs  Lab 05/07/18 0253  05/09/18 0312 05/10/18 0227 05/11/18 0427 05/12/18 0303 05/12/18 2355  WBC 11.1*   < > 15.9* 9.9 9.4 8.9 8.7  NEUTROABS 9.7*  --  13.8* 7.3 6.4 5.3  --   HGB 10.9*   < > 9.3* 8.9* 9.5* 9.5* 9.0*  HCT 34.0*   < > 29.2* 27.7* 29.8* 30.2* 27.9*  MCV 87.2   < > 86.4 84.7 85.6 87.0 85.8  PLT 177   < > 148* 142* 166 202 231   < > = values in this interval not displayed.   Cardiac Enzymes: Recent Labs  Lab 05/07/18 0253 05/09/18 0312 05/09/18 0915 05/09/18 1532 05/10/18 0227  CKTOTAL 176  --   --   --   --   TROPONINI  --  0.83* 0.44* 0.40* 0.23*   BNP: BNP (last 3 results) No results for input(s): BNP in the last 8760 hours.  ProBNP (last 3 results) Recent Labs    01/28/18 1539  PROBNP 67    CBG: Recent Labs  Lab 05/09/18 0725 05/10/18 0728 05/11/18 0748 05/12/18 0738 05/13/18 0806  GLUCAP 128* 102* 93 89 96       Signed:  Domenic Polite MD.  [facility] 05/13/2018, 10:19 AM

## 2018-05-17 DIAGNOSIS — N136 Pyonephrosis: Secondary | ICD-10-CM | POA: Diagnosis not present

## 2018-05-17 DIAGNOSIS — B9620 Unspecified Escherichia coli [E. coli] as the cause of diseases classified elsewhere: Secondary | ICD-10-CM | POA: Diagnosis not present

## 2018-05-17 DIAGNOSIS — Z436 Encounter for attention to other artificial openings of urinary tract: Secondary | ICD-10-CM | POA: Diagnosis not present

## 2018-05-17 DIAGNOSIS — E039 Hypothyroidism, unspecified: Secondary | ICD-10-CM | POA: Diagnosis not present

## 2018-05-17 DIAGNOSIS — I2510 Atherosclerotic heart disease of native coronary artery without angina pectoris: Secondary | ICD-10-CM | POA: Diagnosis not present

## 2018-05-17 DIAGNOSIS — E7800 Pure hypercholesterolemia, unspecified: Secondary | ICD-10-CM | POA: Diagnosis not present

## 2018-05-17 DIAGNOSIS — I779 Disorder of arteries and arterioles, unspecified: Secondary | ICD-10-CM | POA: Diagnosis not present

## 2018-05-17 DIAGNOSIS — I129 Hypertensive chronic kidney disease with stage 1 through stage 4 chronic kidney disease, or unspecified chronic kidney disease: Secondary | ICD-10-CM | POA: Diagnosis not present

## 2018-05-17 DIAGNOSIS — D631 Anemia in chronic kidney disease: Secondary | ICD-10-CM | POA: Diagnosis not present

## 2018-05-17 DIAGNOSIS — M6281 Muscle weakness (generalized): Secondary | ICD-10-CM | POA: Diagnosis not present

## 2018-05-17 DIAGNOSIS — H538 Other visual disturbances: Secondary | ICD-10-CM | POA: Diagnosis not present

## 2018-05-17 DIAGNOSIS — Z951 Presence of aortocoronary bypass graft: Secondary | ICD-10-CM | POA: Diagnosis not present

## 2018-05-17 DIAGNOSIS — N189 Chronic kidney disease, unspecified: Secondary | ICD-10-CM | POA: Diagnosis not present

## 2018-05-17 DIAGNOSIS — I4510 Unspecified right bundle-branch block: Secondary | ICD-10-CM | POA: Diagnosis not present

## 2018-05-17 DIAGNOSIS — Z9181 History of falling: Secondary | ICD-10-CM | POA: Diagnosis not present

## 2018-05-17 DIAGNOSIS — Z792 Long term (current) use of antibiotics: Secondary | ICD-10-CM | POA: Diagnosis not present

## 2018-05-18 DIAGNOSIS — E7800 Pure hypercholesterolemia, unspecified: Secondary | ICD-10-CM | POA: Diagnosis not present

## 2018-05-18 DIAGNOSIS — R944 Abnormal results of kidney function studies: Secondary | ICD-10-CM | POA: Diagnosis not present

## 2018-05-18 DIAGNOSIS — I10 Essential (primary) hypertension: Secondary | ICD-10-CM | POA: Diagnosis not present

## 2018-05-18 DIAGNOSIS — I2510 Atherosclerotic heart disease of native coronary artery without angina pectoris: Secondary | ICD-10-CM | POA: Diagnosis not present

## 2018-05-18 DIAGNOSIS — Z8619 Personal history of other infectious and parasitic diseases: Secondary | ICD-10-CM | POA: Diagnosis not present

## 2018-05-18 DIAGNOSIS — R5381 Other malaise: Secondary | ICD-10-CM | POA: Diagnosis not present

## 2018-05-18 DIAGNOSIS — D649 Anemia, unspecified: Secondary | ICD-10-CM | POA: Diagnosis not present

## 2018-05-18 DIAGNOSIS — N135 Crossing vessel and stricture of ureter without hydronephrosis: Secondary | ICD-10-CM | POA: Diagnosis not present

## 2018-05-28 ENCOUNTER — Encounter (HOSPITAL_COMMUNITY): Payer: Self-pay

## 2018-05-28 ENCOUNTER — Ambulatory Visit (HOSPITAL_COMMUNITY)
Admission: RE | Admit: 2018-05-28 | Discharge: 2018-05-28 | Disposition: A | Discharge status: Home / Self Care | Payer: PPO | Source: Ambulatory Visit | Attending: Urology | Admitting: Urology

## 2018-06-03 ENCOUNTER — Encounter (HOSPITAL_COMMUNITY): Payer: Self-pay | Admitting: Internal Medicine

## 2018-06-03 ENCOUNTER — Inpatient Hospital Stay (HOSPITAL_COMMUNITY)
Admission: EM | Admit: 2018-06-03 | Discharge: 2018-06-07 | DRG: 872 | Disposition: A | Discharge status: Home / Self Care | Payer: PPO | Attending: Internal Medicine | Admitting: Internal Medicine

## 2018-06-03 ENCOUNTER — Other Ambulatory Visit: Payer: Self-pay

## 2018-06-03 ENCOUNTER — Emergency Department (HOSPITAL_COMMUNITY): Payer: PPO

## 2018-06-03 DIAGNOSIS — E785 Hyperlipidemia, unspecified: Secondary | ICD-10-CM | POA: Diagnosis not present

## 2018-06-03 DIAGNOSIS — B965 Pseudomonas (aeruginosa) (mallei) (pseudomallei) as the cause of diseases classified elsewhere: Secondary | ICD-10-CM | POA: Diagnosis not present

## 2018-06-03 DIAGNOSIS — Z885 Allergy status to narcotic agent status: Secondary | ICD-10-CM | POA: Diagnosis not present

## 2018-06-03 DIAGNOSIS — J189 Pneumonia, unspecified organism: Secondary | ICD-10-CM | POA: Diagnosis not present

## 2018-06-03 DIAGNOSIS — Z951 Presence of aortocoronary bypass graft: Secondary | ICD-10-CM

## 2018-06-03 DIAGNOSIS — I4510 Unspecified right bundle-branch block: Secondary | ICD-10-CM | POA: Diagnosis present

## 2018-06-03 DIAGNOSIS — R109 Unspecified abdominal pain: Secondary | ICD-10-CM | POA: Diagnosis not present

## 2018-06-03 DIAGNOSIS — K5730 Diverticulosis of large intestine without perforation or abscess without bleeding: Secondary | ICD-10-CM | POA: Diagnosis not present

## 2018-06-03 DIAGNOSIS — Z952 Presence of prosthetic heart valve: Secondary | ICD-10-CM | POA: Diagnosis not present

## 2018-06-03 DIAGNOSIS — E039 Hypothyroidism, unspecified: Secondary | ICD-10-CM | POA: Diagnosis present

## 2018-06-03 DIAGNOSIS — I129 Hypertensive chronic kidney disease with stage 1 through stage 4 chronic kidney disease, or unspecified chronic kidney disease: Secondary | ICD-10-CM | POA: Diagnosis present

## 2018-06-03 DIAGNOSIS — I10 Essential (primary) hypertension: Secondary | ICD-10-CM | POA: Diagnosis not present

## 2018-06-03 DIAGNOSIS — Z87891 Personal history of nicotine dependence: Secondary | ICD-10-CM

## 2018-06-03 DIAGNOSIS — R079 Chest pain, unspecified: Secondary | ICD-10-CM | POA: Diagnosis not present

## 2018-06-03 DIAGNOSIS — N183 Chronic kidney disease, stage 3 unspecified: Secondary | ICD-10-CM

## 2018-06-03 DIAGNOSIS — N99528 Other complication of other external stoma of urinary tract: Secondary | ICD-10-CM | POA: Diagnosis not present

## 2018-06-03 DIAGNOSIS — R42 Dizziness and giddiness: Secondary | ICD-10-CM | POA: Diagnosis not present

## 2018-06-03 DIAGNOSIS — I2510 Atherosclerotic heart disease of native coronary artery without angina pectoris: Secondary | ICD-10-CM | POA: Diagnosis not present

## 2018-06-03 DIAGNOSIS — Z79899 Other long term (current) drug therapy: Secondary | ICD-10-CM

## 2018-06-03 DIAGNOSIS — A419 Sepsis, unspecified organism: Principal | ICD-10-CM | POA: Diagnosis present

## 2018-06-03 DIAGNOSIS — K760 Fatty (change of) liver, not elsewhere classified: Secondary | ICD-10-CM | POA: Diagnosis not present

## 2018-06-03 DIAGNOSIS — Z886 Allergy status to analgesic agent status: Secondary | ICD-10-CM | POA: Diagnosis not present

## 2018-06-03 DIAGNOSIS — N390 Urinary tract infection, site not specified: Secondary | ICD-10-CM | POA: Diagnosis present

## 2018-06-03 DIAGNOSIS — N135 Crossing vessel and stricture of ureter without hydronephrosis: Secondary | ICD-10-CM | POA: Diagnosis not present

## 2018-06-03 LAB — URINALYSIS, ROUTINE W REFLEX MICROSCOPIC
Bilirubin Urine: NEGATIVE
GLUCOSE, UA: NEGATIVE mg/dL
KETONES UR: NEGATIVE mg/dL
Nitrite: NEGATIVE
PH: 7 (ref 5.0–8.0)
PROTEIN: 100 mg/dL — AB
Specific Gravity, Urine: 1.013 (ref 1.005–1.030)

## 2018-06-03 LAB — COMPREHENSIVE METABOLIC PANEL
ALBUMIN: 3.6 g/dL (ref 3.5–5.0)
ALK PHOS: 64 U/L (ref 38–126)
ALT: 13 U/L — AB (ref 14–54)
AST: 16 U/L (ref 15–41)
Anion gap: 9 (ref 5–15)
BILIRUBIN TOTAL: 0.8 mg/dL (ref 0.3–1.2)
BUN: 14 mg/dL (ref 6–20)
CALCIUM: 9.6 mg/dL (ref 8.9–10.3)
CO2: 26 mmol/L (ref 22–32)
CREATININE: 1.78 mg/dL — AB (ref 0.44–1.00)
Chloride: 105 mmol/L (ref 101–111)
GFR calc Af Amer: 31 mL/min — ABNORMAL LOW (ref >60)
GFR, EST NON AFRICAN AMERICAN: 27 mL/min — AB (ref >60)
GLUCOSE: 129 mg/dL — AB (ref 65–99)
POTASSIUM: 4.1 mmol/L (ref 3.5–5.1)
Sodium: 140 mmol/L (ref 135–145)
TOTAL PROTEIN: 8 g/dL (ref 6.5–8.1)

## 2018-06-03 LAB — CBC WITH DIFFERENTIAL/PLATELET
Abs Immature Granulocytes: 0 10*3/uL (ref 0.0–0.1)
BASOS ABS: 0.1 10*3/uL (ref 0.0–0.1)
Basophils Relative: 1 %
EOS ABS: 0 10*3/uL (ref 0.0–0.7)
EOS PCT: 0 %
HCT: 33.8 % — ABNORMAL LOW (ref 36.0–46.0)
HEMOGLOBIN: 10.5 g/dL — AB (ref 12.0–15.0)
Immature Granulocytes: 0 %
LYMPHS PCT: 18 %
Lymphs Abs: 1.8 10*3/uL (ref 0.7–4.0)
MCH: 27.4 pg (ref 26.0–34.0)
MCHC: 31.1 g/dL (ref 30.0–36.0)
MCV: 88.3 fL (ref 78.0–100.0)
Monocytes Absolute: 1 10*3/uL (ref 0.1–1.0)
Monocytes Relative: 10 %
Neutro Abs: 7.1 10*3/uL (ref 1.7–7.7)
Neutrophils Relative %: 71 %
Platelets: 254 10*3/uL (ref 150–400)
RBC: 3.83 MIL/uL — AB (ref 3.87–5.11)
RDW: 15.5 % (ref 11.5–15.5)
WBC: 10 10*3/uL (ref 4.0–10.5)

## 2018-06-03 LAB — CBG MONITORING, ED: Glucose-Capillary: 133 mg/dL — ABNORMAL HIGH (ref 65–99)

## 2018-06-03 LAB — I-STAT CG4 LACTIC ACID, ED
LACTIC ACID, VENOUS: 1.84 mmol/L (ref 0.5–1.9)
Lactic Acid, Venous: 1.42 mmol/L (ref 0.5–1.9)

## 2018-06-03 MED ORDER — CYCLOSPORINE 0.05 % OP EMUL
1.0000 [drp] | Freq: Two times a day (BID) | OPHTHALMIC | Status: DC
  Administered 2018-06-03 – 2018-06-07 (×8): 1 [drp] via OPHTHALMIC
  Filled 2018-06-03 (×9): qty 1

## 2018-06-03 MED ORDER — ROSUVASTATIN CALCIUM 40 MG PO TABS
40.0000 mg | ORAL_TABLET | Freq: Every day | ORAL | Status: DC
  Administered 2018-06-03 – 2018-06-07 (×5): 40 mg via ORAL
  Filled 2018-06-03 (×6): qty 1

## 2018-06-03 MED ORDER — LACTATED RINGERS IV BOLUS (SEPSIS)
1000.0000 mL | Freq: Once | INTRAVENOUS | Status: AC
  Administered 2018-06-03: 1000 mL via INTRAVENOUS

## 2018-06-03 MED ORDER — LACTATED RINGERS IV BOLUS (SEPSIS): 1000.0000 mL | Freq: Once | INTRAVENOUS | Status: DC

## 2018-06-03 MED ORDER — ACETAMINOPHEN 325 MG PO TABS: 650.0000 mg | ORAL_TABLET | Freq: Four times a day (QID) | ORAL | Status: DC | PRN

## 2018-06-03 MED ORDER — ONDANSETRON HCL 4 MG PO TABS: 4.0000 mg | ORAL_TABLET | Freq: Four times a day (QID) | ORAL | Status: DC | PRN

## 2018-06-03 MED ORDER — ENSURE ENLIVE PO LIQD
237.0000 mL | Freq: Two times a day (BID) | ORAL | Status: DC
  Administered 2018-06-04: 237 mL via ORAL

## 2018-06-03 MED ORDER — SODIUM CHLORIDE 0.9 % IV SOLN
2.0000 g | INTRAVENOUS | Status: DC
  Administered 2018-06-04 – 2018-06-07 (×4): 2 g via INTRAVENOUS
  Filled 2018-06-03 (×4): qty 2

## 2018-06-03 MED ORDER — METOPROLOL TARTRATE 25 MG PO TABS
25.0000 mg | ORAL_TABLET | Freq: Two times a day (BID) | ORAL | Status: DC
  Administered 2018-06-04 – 2018-06-07 (×7): 25 mg via ORAL
  Filled 2018-06-03 (×7): qty 1

## 2018-06-03 MED ORDER — ACETAMINOPHEN 650 MG RE SUPP: 650.0000 mg | Freq: Four times a day (QID) | RECTAL | Status: DC | PRN

## 2018-06-03 MED ORDER — ACETAMINOPHEN 325 MG PO TABS
650.0000 mg | ORAL_TABLET | Freq: Four times a day (QID) | ORAL | Status: DC | PRN
  Administered 2018-06-03 – 2018-06-06 (×3): 650 mg via ORAL
  Administered 2018-06-07: 325 mg via ORAL
  Filled 2018-06-03 (×4): qty 2

## 2018-06-03 MED ORDER — DOCUSATE SODIUM 100 MG PO CAPS
100.0000 mg | ORAL_CAPSULE | Freq: Two times a day (BID) | ORAL | Status: DC
  Administered 2018-06-03 – 2018-06-07 (×7): 100 mg via ORAL
  Filled 2018-06-03 (×8): qty 1

## 2018-06-03 MED ORDER — ENOXAPARIN SODIUM 40 MG/0.4ML ~~LOC~~ SOLN
40.0000 mg | SUBCUTANEOUS | Status: DC
  Administered 2018-06-03 – 2018-06-06 (×4): 40 mg via SUBCUTANEOUS
  Filled 2018-06-03 (×4): qty 0.4

## 2018-06-03 MED ORDER — VANCOMYCIN HCL 10 G IV SOLR
2000.0000 mg | Freq: Once | INTRAVENOUS | Status: AC
  Administered 2018-06-03: 2000 mg via INTRAVENOUS
  Filled 2018-06-03: qty 2000

## 2018-06-03 MED ORDER — ONDANSETRON HCL 4 MG/2ML IJ SOLN: 4.0000 mg | Freq: Four times a day (QID) | INTRAMUSCULAR | Status: DC | PRN

## 2018-06-03 MED ORDER — LACTATED RINGERS IV SOLN
INTRAVENOUS | Status: DC
  Administered 2018-06-03 – 2018-06-07 (×5): via INTRAVENOUS

## 2018-06-03 MED ORDER — SODIUM CHLORIDE 0.9 % IV SOLN
2.0000 g | Freq: Once | INTRAVENOUS | Status: AC
  Administered 2018-06-03: 2 g via INTRAVENOUS
  Filled 2018-06-03 (×2): qty 2

## 2018-06-03 MED ORDER — VANCOMYCIN HCL 10 G IV SOLR: 1250.0000 mg | INTRAVENOUS | Status: DC

## 2018-06-03 NOTE — Progress Notes (Signed)
Patient's temp 103.1. PRN Tylenol administered. MD notified.

## 2018-06-03 NOTE — H&P (Signed)
History and Physical    Miranda Hutchinson DGU:[id number] DOB: 1946/09/14 DOA: 06/03/2018  PCP: Antony Contras, MD Consultants:  Ellyn Hack - cardiology Patient coming from:  Home - lives with grandson; NOK: grandson, [phone]  Chief Complaint:  L flank pain  HPI: Miranda Hutchinson is a 72 y.o. female with medical history significant of stage 3 CKD; hypothyroidism; HLD; CAD s/p CABG; carotid disease; and AAA s/p Bentall in 2013 presenting with L flank pain.  The pain started before her last admission.  It waxes and wanes.  It started bothering her again Tuesday.  This is the same pain as last time.  She also doesn't have an appetite.  No fevers.  No dysuria - she did have that prior but not this time.  She completed 10+ days of antibiotic prior to last hospitalization and then had another 4 days after the last hospitalization.  No respiratory symptoms.  No SOB.   She was hospitalized from 5/10-16 for septic shock from E coli UTI with left UPJ obstruction s/p PCN placement on 5/11.  She was discharged home on oral Keflex to complete a 10-day course.  Urine culture was positive for E coli on both 4/17 and 5/10, same resistance pattern (resistant to Amp, Bactrim, and Cipro).  ED Course:  AMS, back pain.  Normal lactate, no end organ damage.  Thought to have urinary source for fever.  UA is still pending.  CXR shows consolidation of the LLL.  Concern for sepsis from HCAP and maybe UTI.  Has had bolus and is on Cefepime and Vanc.  Review of Systems: As per HPI; otherwise review of systems reviewed and negative.   Ambulatory Status:  Ambulates without assistance  Past Medical History:  Diagnosis Date  . Aortic insufficiency    a. ascending aortic root aneursym with AI s/p Bentall procedure with bioprosthetic aortic valve 07/2012.  . Ascending aortic aneurysm ([place])    a. s/p Bentall 2013.  . Aspirin intolerance   . Bilateral carotid artery disease ([place])    a.  carotid duplex 2013 showed 40-59% distal  RICA and 95-63% mLICA stenosis. b. Duplex 09/2017 - <40% BICA.  Marland Kitchen Coronary artery disease    a. 3 vessel ASCAD s/p CABG (LIMA to LAD, SVG to OM, SVG to PDA) at time of Bentall 07/2012.  Marland Kitchen Heart murmur   . Hyperlipidemia   . Hypothyroidism   . RBBB   . Renal insufficiency    a. Cr 1.31 in 2016, previously 1 range.    Past Surgical History:  Procedure Laterality Date  . ABDOMINAL HYSTERECTOMY  1990's  . AORTIC VALVE REPLACEMENT  08/10/2012   Procedure: AORTIC VALVE REPLACEMENT (AVR);  Surgeon: Grace Isaac, MD;  Location: [place];  Service: Open Heart Surgery;  Laterality: N/A;  . BREAST EXCISIONAL BIOPSY Left   . CARDIAC CATHETERIZATION  08/04/2012  . CORONARY ARTERY BYPASS GRAFT  08/10/2012   Procedure: CORONARY ARTERY BYPASS GRAFTING (CABG);  Surgeon: Grace Isaac, MD;  Location: [place];  Service: Open Heart Surgery;  Laterality: N/A;  . IR NEPHROSTOMY PLACEMENT LEFT  05/08/2018  . THORACIC AORTIC ANEURYSM REPAIR  08/10/2012   Procedure: THORACIC ASCENDING ANEURYSM REPAIR (AAA);  Surgeon: Grace Isaac, MD;  Location: [place];  Service: Open Heart Surgery;  Laterality: N/A;  . TONSILLECTOMY     "I was a little child"    Social History   Socioeconomic History  . Marital status: Widowed    Spouse name: Not on file  .  Number of children: Not on file  . Years of education: Not on file  . Highest education level: Not on file  Occupational History  . Occupation: retired  Scientific laboratory technician  . Financial resource strain: Not on file  . Food insecurity:    Worry: Not on file    Inability: Not on file  . Transportation needs:    Medical: Not on file    Non-medical: Not on file  Tobacco Use  . Smoking status: Former Smoker    Packs/day: 0.05    Years: 2.00    Pack years: 0.10    Types: Cigarettes    Last attempt to quit: 12/30/1983    Years since quitting: 34.4  . Smokeless tobacco: Never Used  Substance and Sexual Activity  . Alcohol use: No  . Drug use: No  . Sexual  activity: Never  Lifestyle  . Physical activity:    Days per week: Not on file    Minutes per session: Not on file  . Stress: Not on file  Relationships  . Social connections:    Talks on phone: Not on file    Gets together: Not on file    Attends religious service: Not on file    Active member of club or organization: Not on file    Attends meetings of clubs or organizations: Not on file    Relationship status: Not on file  . Intimate partner violence:    Fear of current or ex partner: Not on file    Emotionally abused: Not on file    Physically abused: Not on file    Forced sexual activity: Not on file  Other Topics Concern  . Not on file  Social History Narrative  . Not on file    Allergies  Allergen Reactions  . Percocet [Oxycodone-Acetaminophen] Other (See Comments)    Hallucinations   . Asa [Aspirin] Nausea Only and Other (See Comments)    "hurts my stomach"    Family History  Problem Relation Age of Onset  . Lung cancer Mother   . Thyroid disease Mother     Prior to Admission medications   Medication Sig Start Date End Date Taking? Authorizing Provider  acetaminophen (TYLENOL) 325 MG tablet Take 2 tablets (650 mg total) by mouth every 6 (six) hours as needed for fever (temp >101). 05/13/18  Yes Domenic Polite, MD  cycloSPORINE (RESTASIS) 0.05 % ophthalmic emulsion Place 1 drop into both eyes 2 (two) times daily.    Yes [provider]  metoprolol tartrate (LOPRESSOR) 25 MG tablet Take 25 mg by mouth 2 (two) times daily.   Yes [provider]  Multiple Vitamins-Minerals (ALIVE WOMENS 50+ PO) Take 1 tablet by mouth daily.   Yes [provider]  rosuvastatin (CRESTOR) 40 MG tablet TAKE 1 TABLET(40 MG) BY MOUTH DAILY 10/19/17  Yes Dunn, Dayna N, PA-C  cephALEXin (KEFLEX) 500 MG capsule Take 1 capsule (500 mg total) by mouth every 8 (eight) hours. For 4days Patient not taking: Reported on 06/03/2018 05/13/18   Domenic Polite, MD    Physical  Exam: Vitals:   06/03/18 1530 06/03/18 1545 06/03/18 1600 06/03/18 1615  BP: (!) 103/55 (!) 113/59 122/60 (!) 118/52  Pulse: (!) 101 94 92 95  Resp: 15 (!) 22 (!) 23 18  Temp:      TempSrc:      SpO2: 96% 97% 98% 98%  Weight:      Height:  General:  Appears calm and comfortable and is NAD Eyes:  PERRL, EOMI, normal lids, iris ENT:  grossly normal hearing, lips & tongue, mmm Neck:  no LAD, masses or thyromegaly Cardiovascular:  RRR, no r/g, 2-9/5 systolic murmur. No LE edema.  Respiratory:   CTA bilaterally with no wheezes/rales/rhonchi.  Normal respiratory effort. Abdomen:  soft, NT, ND, NABS Back:   normal alignment, no CVAT, left nephrostomy tube is in place with dressing C/D/I and no surrounding erythema. Skin:  no rash or induration seen on limited exam Musculoskeletal:  grossly normal tone BUE/BLE, good ROM, no bony abnormality; she has a leg bag in place with urine in the bag Psychiatric:  grossly normal mood and affect, speech fluent and appropriate, AOx3 Neurologic:  CN 2-12 grossly intact, moves all extremities in coordinated fashion, sensation intact    Radiological Exams on Admission: Dg Chest 2 View  Result Date: 06/03/2018 CLINICAL DATA:  Pt c/o left-sided chest pain x 1 day. Hx of aortic insufficiency, ascending aortic aneurysm, CAD, and heart murmur. Pt is a former smoker. EXAM: CHEST - 2 VIEW COMPARISON:  05/12/2018 FINDINGS: Median sternotomy and valve replacement. The heart is mildly enlarged, accentuated by rotation towards the LEFT. There is stable elevation of the RIGHT hemidiaphragm. There is patchy density in the LEFT lung base partially obscuring the hemidiaphragm. No pulmonary edema. IMPRESSION: LEFT LOWER lobe opacity, consistent with atelectasis or infiltrate. Cardiomegaly without pulmonary edema. Electronically Signed   By: Nolon Nations M.D.   On: 06/03/2018 14:53   Ct Renal Stone Study  Result Date: 06/03/2018 CLINICAL DATA:  LEFT-sided flank  pain today. EXAM: CT ABDOMEN AND PELVIS WITHOUT CONTRAST TECHNIQUE: Multidetector CT imaging of the abdomen and pelvis was performed following the standard protocol without IV contrast. COMPARISON:  05/05/2018 CT FINDINGS: Lower chest: There is minimal RIGHT middle lobe atelectasis or scarring. There are focal calcifications within the coronary arteries. Heart size appears normal. No pericardial effusion. Hepatobiliary: The liver is diffusely low attenuation consistent with hepatic steatosis. The gallbladder is present and contains layering stones. Pancreas: Unremarkable. No pancreatic ductal dilatation or surrounding inflammatory changes. Spleen: Normal in size without focal abnormality. Adrenals/Urinary Tract: The patient has a new LEFT percutaneous nephrostomy decompressing the collecting system. There is a stable 8 millimeter calcification in the LOWER pole of the LEFT kidney, not causing obstruction. There is minimal perinephric stranding on the LEFT, likely related to recent obstruction and intervention. No ureteral stones are identified. The ureters are not obstructed. Numerous small phleboliths are identified in the pelvis. Normal appearance of the urinary bladder. The Stomach/Bowel: Numerous colonic diverticula without evidence for acute diverticulitis. The appendix is well seen and has a normal appearance. Small hiatal hernia. The stomach and small bowel loops are normal in appearance. Vascular/Lymphatic: There is extensive atherosclerotic calcification of the abdominal aorta. Proximal iliac arteries are aneurysmal, 2.2 centimeters on the LEFT and 2.1 centimeters on the RIGHT. No retroperitoneal or mesenteric adenopathy. Reproductive: Hysterectomy.  No adnexal mass. Other: Small density in the LEFT anterior abdominal wall subcutaneous fat is 1.5 centimeters and likely represents an injection site. There is no free pelvic fluid. Musculoskeletal: Mild degenerative changes in the LOWER lumbar spine.  IMPRESSION: 1. LEFT percutaneous nephrostomy decompresses the collecting system of the LEFT kidney. 2. No evidence for ureteral obstruction. 3. Colonic diverticulosis without acute diverticulitis. 4. Aortic atherosclerosis.  (ICD10-I70.0) 5. Hepatic steatosis. 6. Hysterectomy. 7. Cholelithiasis. Electronically Signed   By: Nolon Nations M.D.   On: 06/03/2018 15:41  EKG: Independently reviewed.  Sinus tachycardia with rate 103; RBBB; nonspecific ST changes with no evidence of acute ischemia   Labs on Admission: I have personally reviewed the available labs and imaging studies at the time of the admission.  Pertinent labs:   Glucose 129 BUN 14/Creatinine 1.78/GFR 31 - at/better than baseline WBC 10 Hgb 10.5 - improved Lactate 1.84   Assessment/Plan Principal Problem:   Flank pain Active Problems:   Coronary artery disease   Essential hypertension, benign   CKD (chronic kidney disease) stage 3, GFR 30-59 ml/min (HCC)   L flank pain -Patient with recent admission for urosepsis and UPJ obstruction requiring nephrostomy tube presenting with L flank pain -In the ER, she had 3 SIRS criteria (fever, tachycardia, tachypnea) - but NO leukocytosis, normal lactate, and no other evidence of organ failure concerning for sepsis -Most likely source of infection is clearly UTI -There was some concern for HCAP given possible atelectasis vs. Infiltrate on CXR; this is not visualized on CT and the patient has no respiratory complaints or respiratory findings on exam and so pneumonia seems exceedingly unlikely as the source at this time -Unfortunately, they have not obtained a UA.  Suspect UTI as source of symptoms.  Urine culture will also be obtained. -For now, will continue Cefepime/Vanc since there is not a definite source, but assuming the UA is consistent with UTI, will narrow to Cefepime only. -If UA is negative for infection (which, again, seems extremely unlikely), then will plan to admit  patient to [facility] for ongoing infection of uncertain etiology treatment and also urology consultation.  It is possible that the nephrostomy tube is the source, although the CT was negative and so this is less likely.  HTN -ARB discontinued during last hospitalization -Appears to be stable on Lopressor  CKD -Appears to be stable -Off Losartan since last hospitalization; she is doing well for now but it may be reasonable to add this back in the future  CAD -Stable s/p CABG -Also with h/o AAA s/p Bentall procedure -Continue Crestor    DVT prophylaxis: Lovenox Code Status:  Full - confirmed with patient/family Family Communication: Family present for majority of evaluation Disposition Plan:  Home once clinically improved Consults called: None (urology if patient is transferred to [facility])  Admission status: Admit - It is my clinical opinion that admission to [place] is reasonable and necessary because of the expectation that this patient will require hospital care that crosses at least 2 midnights to treat this condition based on the medical complexity of the problems presented.  Given the aforementioned information, the predictability of an adverse outcome is felt to be significant.   Addendum: UA shows moderate Hgb, large LE, 100 protein, few bacteria, >50 WBC.  Will treat as UTI for now as above and observe the patient at [facility] for now.    Karmen Bongo MD [facility]  If note is complete, please contact covering daytime or nighttime physician. [email or web address] Password [facility]  06/03/2018, 4:57 PM

## 2018-06-03 NOTE — ED Triage Notes (Signed)
Patient in today for back pain x several months. Patient states she is in today for "the pain". In lobby patient became briefly unresponsive.

## 2018-06-03 NOTE — ED Provider Notes (Addendum)
I saw and evaluated the patient, reviewed the resident's note and I agree with the findings and plan.  EKG: EKG Interpretation  Date/Time:  Thursday June 03 2018 13:44:13 EDT Ventricular Rate:  103 PR Interval:    QRS Duration: 132 QT Interval:  362 QTC Calculation: 474 R Axis:   89 Text Interpretation:  Sinus tachycardia Right bundle branch block Confirmed by Lacretia Leigh ([id number]) on 06/03/2018 2:51:64 PM 72 year old female presents with fever and back pain for several months.  Recently treated for UTI.  Febrile here and concern for recurrent infection.  Also has a nephrostomy tube in renal CT is pending for possible tube obstruction.  Renal CT was negative for obstruction.  Patient's urinalysis is positive for infection.  Patient started on antibiotics for presumed sepsis.  CRITICAL CARE Performed by: Leota Jacobsen Total critical care time: 45 minutes Critical care time was exclusive of separately billable procedures and treating other patients. Critical care was necessary to treat or prevent imminent or life-threatening deterioration. Critical care was time spent personally by me on the following activities: development of treatment plan with patient and/or surrogate as well as nursing, discussions with consultants, evaluation of patient's response to treatment, examination of patient, obtaining history from patient or surrogate, ordering and performing treatments and interventions, ordering and review of laboratory studies, ordering and review of radiographic studies, pulse oximetry and re-evaluation of patient's condition.    Lacretia Leigh, MD 06/03/18 1428    Lacretia Leigh, MD 06/16/18 1410

## 2018-06-03 NOTE — Progress Notes (Signed)
1816 Received pt from ED. Patient alert and oriented to person and place. Resting in bed. Will continue to monitor.

## 2018-06-03 NOTE — Progress Notes (Signed)
Pharmacy Antibiotic Note  Miranda Hutchinson is a 72 y.o. female admitted on 06/03/2018 with sepsis.  Pharmacy has been consulted for vancomycin and cefepime dosing. Pt recently admitted and treated for E. Coli UTI.  Plan: -Vancomycin 2000mg  IV x1 then 1250mg  IV q24h -Cefepime 2g IV q24h -Monitor renal funx, LOT, cultures -Vancomycin level as indicated  Height: 5\' 6"  (167.6 cm) Weight: 214 lb (97.1 kg) IBW/kg (Calculated) : 59.3  Temp (24hrs), Avg:102.4 F (39.1 C), Min:101.3 F (38.5 C), Max:103.4 F (39.7 C)  Recent Labs  Lab 06/03/18 1419  LATICACIDVEN 1.84    CrCl cannot be calculated (Patient's most recent lab result is older than the maximum 21 days allowed.).    Allergies  Allergen Reactions  . Percocet [Oxycodone-Acetaminophen] Other (See Comments)    Hallucinations   . Asa [Aspirin] Nausea Only and Other (See Comments)    "hurts my stomach"    Antimicrobials this admission: Vancomycin 6/6 >>  Cefepime 6/6 >>   Dose adjustments this admission: none  Microbiology results: sent  Thank you for allowing pharmacy to be a part of this patient's care.  Arrie Senate, PharmD, BCPS PGY-2 Cardiology Pharmacy Resident Phone: [phone] 06/03/2018

## 2018-06-03 NOTE — ED Provider Notes (Addendum)
[facility] EMERGENCY DEPARTMENT Provider Note   CSN: [id number] Arrival date & time: 06/03/18  1246     History   Chief Complaint Chief Complaint  Patient presents with  . Back Pain    HPI Miranda Hutchinson is a 72 y.o. female. With CAD s/p CABG, bilateral carotid artery disease, hyperlipidemia, thyrodid disease, and ascending aortic aneurysm s/p Bentall procedure in 2013 who presented with back pain for several months.   The patient was confused and only able to answer few questions accurately regarding person, place, or time. She states that she has been having back pain for several months now that has been worsening and that prompted her to come to the ED to be seen.   In the ED waiting room the patient was not responsive briefly, but maintained good pulse.   The patient was recently admitted in the hospital from 05/07/18-05/13/18 with sepsis secondary to uti from E. Coli and ureteropelvic junction obstruction. The patient required pressors initially and was treated with IV rocephin that was transitioned to oral keflex. She was also discharged with a nephrostomy tube and told to follow up with urology in 2 weeks.  Past Medical History:  Diagnosis Date  . Aortic insufficiency    a. ascending aortic root aneursym with AI s/p Bentall procedure with bioprosthetic aortic valve 07/2012.  . Ascending aortic aneurysm ([place])    a. s/p Bentall 2013.  . Aspirin intolerance   . Bilateral carotid artery disease ([place])    a.  carotid duplex 2013 showed 40-59% distal RICA and 60-73% mLICA stenosis. b. Duplex 09/2017 - <40% BICA.  Marland Kitchen Coronary artery disease    a. 3 vessel ASCAD s/p CABG (LIMA to LAD, SVG to OM, SVG to PDA) at time of Bentall 07/2012.  Marland Kitchen Dyslipidemia   . Heart murmur   . Hyperlipidemia   . Hyperthyroidism   . Hypothyroidism   . RBBB   . Renal insufficiency    a. Cr 1.31 in 2016, previously 1 range.    Patient Active Problem List   Diagnosis Date Noted  .  AKI (acute kidney injury) ([place]) 05/07/2018  . Severe sepsis ([place]) 05/07/2018  . Sepsis secondary to UTI ([place]) 05/07/2018  . Septic shock ([place]) 05/07/2018  . Urinary tract infection with hematuria   . Bilateral carotid artery disease ([place]) 05/06/2018  . S/P ascending aortic aneurysm repair 05/06/2018  . Hypotension 05/06/2018  . Multinodular goiter 09/12/2015  . Hyperthyroidism 09/12/2015  . Dizziness 07/03/2014  . Blurred vision 07/03/2014  . Headache(784.0) 07/03/2014  . S/P CABG x 3 08/16/2012  . S/P ascending aortic replacement 08/16/2012  . S/P AVR (aortic valve replacement) 08/16/2012  . Essential hypertension, benign 08/05/2012  . Pure hypercholesterolemia 08/05/2012  . Obesity, unspecified 08/05/2012  . Coronary artery disease 08/04/2012  . Aortic valve insufficiency 08/04/2012  . Dilated aortic root ([place]) 08/04/2012    Past Surgical History:  Procedure Laterality Date  . ABDOMINAL HYSTERECTOMY  1990's  . AORTIC VALVE REPLACEMENT  08/10/2012   Procedure: AORTIC VALVE REPLACEMENT (AVR);  Surgeon: Grace Isaac, MD;  Location: [facility];  Service: Open Heart Surgery;  Laterality: N/A;  . BREAST EXCISIONAL BIOPSY Left   . CARDIAC CATHETERIZATION  08/04/2012  . CORONARY ARTERY BYPASS GRAFT  08/10/2012   Procedure: CORONARY ARTERY BYPASS GRAFTING (CABG);  Surgeon: Grace Isaac, MD;  [facility];  Service: Open Heart Surgery;  Laterality: N/A;  . IR NEPHROSTOMY PLACEMENT LEFT  05/08/2018  . THORACIC  AORTIC ANEURYSM REPAIR  08/10/2012   Procedure: THORACIC ASCENDING ANEURYSM REPAIR (AAA);  Surgeon: Grace Isaac, MD;  Location: [facility];  Service: Open Heart Surgery;  Laterality: N/A;  . TONSILLECTOMY     "I was a little child"     OB History   None      Home Medications    Prior to Admission medications   Medication Sig Start Date End Date Taking? Authorizing Provider  acetaminophen (TYLENOL) 325 MG tablet Take 2 tablets (650 mg total) by mouth every 6 (six)  hours as needed for fever (temp >101). 05/13/18   Domenic Polite, MD  cephALEXin (KEFLEX) 500 MG capsule Take 1 capsule (500 mg total) by mouth every 8 (eight) hours. For 4days 05/13/18   Domenic Polite, MD  cycloSPORINE (RESTASIS) 0.05 % ophthalmic emulsion Place 1 drop into both eyes 2 (two) times daily.     [provider]  metoprolol tartrate (LOPRESSOR) 25 MG tablet Take 25 mg by mouth 2 (two) times daily.    [provider]  Multiple Vitamins-Minerals (ALIVE WOMENS 50+ PO) Take 1 tablet by mouth daily.    [provider]  rosuvastatin (CRESTOR) 40 MG tablet TAKE 1 TABLET(40 MG) BY MOUTH DAILY 10/19/17   Charlie Pitter, PA-C    Family History Family History  Problem Relation Age of Onset  . Lung cancer Mother   . Thyroid disease Mother     Social History Social History   Tobacco Use  . Smoking status: Former Smoker    Packs/day: 0.05    Years: 2.00    Pack years: 0.10    Types: Cigarettes    Last attempt to quit: 12/30/1983    Years since quitting: 34.4  . Smokeless tobacco: Never Used  Substance Use Topics  . Alcohol use: No  . Drug use: No     Allergies   Percocet [oxycodone-acetaminophen] and Asa [aspirin]   Review of Systems Review of Systems  Musculoskeletal: Positive for back pain.  Neurological: Positive for dizziness and light-headedness.    Physical Exam Updated Vital Signs BP 127/60 (BP Location: Right Arm)   Pulse 99   Temp (!) 103.4 F (39.7 C) (Rectal)   Resp (!) 36   Ht 5\' 6"  (1.676 m)   Wt 97.1 kg (214 lb)   SpO2 95%   BMI 34.54 kg/m   Physical Exam  Constitutional: She appears well-developed and well-nourished.  HENT:  Head: Normocephalic and atraumatic.  Eyes: Conjunctivae are normal.  Cardiovascular: Normal rate and regular rhythm.  Pulmonary/Chest: Effort normal and breath sounds normal. No stridor. No respiratory distress.  Abdominal: Soft. Bowel sounds are normal. She exhibits no distension. There is no  tenderness.  Musculoskeletal: She exhibits no edema.  Neurological:  Altered, not oriented to person place or time  Psychiatric: She has a normal mood and affect. Her behavior is normal. Judgment and thought content normal.   ED Treatments / Results  Labs (all labs ordered are listed, but only abnormal results are displayed) Labs Reviewed  COMPREHENSIVE METABOLIC PANEL - Abnormal; Notable for the following components:      Result Value   Glucose, Bld 129 (*)    Creatinine, Ser 1.78 (*)    ALT 13 (*)    GFR calc non Af Amer 27 (*)    GFR calc Af Amer 31 (*)    All other components within normal limits  CBC WITH DIFFERENTIAL/PLATELET - Abnormal; Notable for the following components:   RBC 3.83 (*)  Hemoglobin 10.5 (*)    HCT 33.8 (*)    All other components within normal limits  CBG MONITORING, ED - Abnormal; Notable for the following components:   Glucose-Capillary 133 (*)    All other components within normal limits  CULTURE, BLOOD (ROUTINE X 2)  CULTURE, BLOOD (ROUTINE X 2)  URINALYSIS, ROUTINE W REFLEX MICROSCOPIC  I-STAT CG4 LACTIC ACID, ED  I-STAT CG4 LACTIC ACID, ED    EKG EKG Interpretation  Date/Time:  Thursday June 03 2018 13:44:13 EDT Ventricular Rate:  103 PR Interval:    QRS Duration: 132 QT Interval:  362 QTC Calculation: 474 R Axis:   89 Text Interpretation:  Sinus tachycardia Right bundle branch block Confirmed by Lacretia Leigh ([id number]) on 06/03/2018 2:24:24 PM   Radiology Dg Chest 2 View  Result Date: 06/03/2018 CLINICAL DATA:  Pt c/o left-sided chest pain x 1 day. Hx of aortic insufficiency, ascending aortic aneurysm, CAD, and heart murmur. Pt is a former smoker. EXAM: CHEST - 2 VIEW COMPARISON:  05/12/2018 FINDINGS: Median sternotomy and valve replacement. The heart is mildly enlarged, accentuated by rotation towards the LEFT. There is stable elevation of the RIGHT hemidiaphragm. There is patchy density in the LEFT lung base partially obscuring the  hemidiaphragm. No pulmonary edema. IMPRESSION: LEFT LOWER lobe opacity, consistent with atelectasis or infiltrate. Cardiomegaly without pulmonary edema. Electronically Signed   By: Nolon Nations M.D.   On: 06/03/2018 14:53    Procedures Procedures (including critical care time)  Medications Ordered in ED Medications  acetaminophen (TYLENOL) tablet 650 mg (has no administration in time range)  ceFEPIme (MAXIPIME) 2 g in sodium chloride 0.9 % 100 mL IVPB (has no administration in time range)  vancomycin (VANCOCIN) 2,000 mg in sodium chloride 0.9 % 500 mL IVPB (has no administration in time range)  ceFEPIme (MAXIPIME) 2 g in sodium chloride 0.9 % 100 mL IVPB (has no administration in time range)  vancomycin (VANCOCIN) 1,250 mg in sodium chloride 0.9 % 250 mL IVPB (has no administration in time range)  lactated ringers bolus 1,000 mL (1,000 mLs Intravenous New Bag/Given 06/03/18 1412)    Initial Impression / Assessment and Plan / ED Course  I have reviewed the triage vital signs and the nursing notes.  Pertinent labs & imaging results that were available during my care of the patient were reviewed by me and considered in my medical decision making (see chart for details).  Ms. Hancox is a 72 y.o female presented with with altered mental status and back pain. Patient has fever to 103 and tachypneic to 36. Lactic acid to 1.84. No significant end organ damage as creatinine=1.78 (baseline 1.6-3), normal liver enzymes.   Code sepsis called with suspected source being urinary. Blood cultures collected. Patient started on Vancomycin, cefepime, and 1L LR. Chest xray shows left lower lobe opacity causing concern for pneumonia. CT renal study done to note if there is any nephritic abscess which returned without any ureteral obstruction or colonic diverticulosis without diverticulitis. Urinalysis pending.  Will admit to hospitalist for HCAP and sepsis care.    Final Clinical Impressions(s) / ED  Diagnoses   Final diagnoses:  None    ED Discharge Orders    None       Lars Mage, MD 06/03/18 1551    Lars Mage, MD 06/03/18 1552    Lars Mage, MD 06/03/18 1559    Lacretia Leigh, MD 06/05/18 1147

## 2018-06-04 DIAGNOSIS — R109 Unspecified abdominal pain: Secondary | ICD-10-CM

## 2018-06-04 DIAGNOSIS — J189 Pneumonia, unspecified organism: Secondary | ICD-10-CM

## 2018-06-04 DIAGNOSIS — I10 Essential (primary) hypertension: Secondary | ICD-10-CM

## 2018-06-04 DIAGNOSIS — I2510 Atherosclerotic heart disease of native coronary artery without angina pectoris: Secondary | ICD-10-CM

## 2018-06-04 LAB — CBC
HCT: 29.2 % — ABNORMAL LOW (ref 36.0–46.0)
HEMOGLOBIN: 9.3 g/dL — AB (ref 12.0–15.0)
MCH: 28.4 pg (ref 26.0–34.0)
MCHC: 31.8 g/dL (ref 30.0–36.0)
MCV: 89 fL (ref 78.0–100.0)
Platelets: 199 10*3/uL (ref 150–400)
RBC: 3.28 MIL/uL — ABNORMAL LOW (ref 3.87–5.11)
RDW: 15.6 % — AB (ref 11.5–15.5)
WBC: 8.7 10*3/uL (ref 4.0–10.5)

## 2018-06-04 LAB — BASIC METABOLIC PANEL
Anion gap: 6 (ref 5–15)
BUN: 12 mg/dL (ref 6–20)
CALCIUM: 8.7 mg/dL — AB (ref 8.9–10.3)
CO2: 23 mmol/L (ref 22–32)
Chloride: 109 mmol/L (ref 101–111)
Creatinine, Ser: 1.53 mg/dL — ABNORMAL HIGH (ref 0.44–1.00)
GFR calc Af Amer: 38 mL/min — ABNORMAL LOW (ref >60)
GFR, EST NON AFRICAN AMERICAN: 32 mL/min — AB (ref >60)
GLUCOSE: 116 mg/dL — AB (ref 65–99)
Potassium: 3.9 mmol/L (ref 3.5–5.1)
SODIUM: 138 mmol/L (ref 135–145)

## 2018-06-04 MED ORDER — ADULT MULTIVITAMIN W/MINERALS CH
1.0000 | ORAL_TABLET | Freq: Every day | ORAL | Status: DC
  Administered 2018-06-04 – 2018-06-07 (×4): 1 via ORAL
  Filled 2018-06-04 (×4): qty 1

## 2018-06-04 MED ORDER — ENSURE ENLIVE PO LIQD
237.0000 mL | Freq: Three times a day (TID) | ORAL | Status: DC
  Administered 2018-06-05 – 2018-06-07 (×6): 237 mL via ORAL

## 2018-06-04 NOTE — Progress Notes (Signed)
Initial Nutrition Assessment  DOCUMENTATION CODES:   Obesity unspecified  INTERVENTION:   -Increase Ensure Enlive po to TID, each supplement provides 350 kcal and 20 grams of protein -MVI with minerals daily  NUTRITION DIAGNOSIS:   Inadequate oral intake related to decreased appetite(altered taste perception) as evidenced by meal completion < 25%, per patient/family report.  GOAL:   Patient will meet greater than or equal to 90% of their needs   MONITOR:   PO intake, Supplement acceptance, Labs, Weight trends, Skin, I & O's  REASON FOR ASSESSMENT:   Malnutrition Screening Tool    ASSESSMENT:   Miranda Hutchinson is a 72 y.o. female with medical history significant of stage 3 CKD; hypothyroidism; HLD; CAD s/p CABG; carotid disease; and AAA s/p Bentall in 2013 presenting with L flank pain.  Pt admitted with lt flank pain.   Spoke with pt at bedside, who reports suffering from decreased appetite since being discharged from this hospital earlier this month. Prior to last hospitalization, pt shares she had a very good appetite, consuming 2 meals per day (pt generally skips breakfast). She estimates that she now eats 25-50% less than she usually does. Noted meal completion 25%. Pt biggest complaint is that she feels that there is a film on her tongue which results in an off-putting taste. She consumed an Ensure supplement this morning which she likes and tolerates well.   Pt reports her UBW is around 214#. She endorses an 18# (8.4%) wt loss over the past month, however, this is not confirmed by wt hx.   She shares that she has been feeling more weak lately; usually has to rest when walking short distances (ex bedroom to living room) in her home.   Discussed importance of good meal and supplement intake to promote healing. Pt amenable to continue Ensure supplements. Also discussed similar supplements that she could purchase after discharge for home use (pt tried Boost in the past but  did not like it).   Labs reviewed.   NUTRITION - FOCUSED PHYSICAL EXAM:    Most Recent Value  Orbital Region  No depletion  Upper Arm Region  No depletion  Thoracic and Lumbar Region  No depletion  Buccal Region  No depletion  Temple Region  No depletion  Clavicle Bone Region  No depletion  Clavicle and Acromion Bone Region  No depletion  Scapular Bone Region  No depletion  Dorsal Hand  No depletion  Patellar Region  No depletion  Anterior Thigh Region  No depletion  Posterior Calf Region  No depletion  Edema (RD Assessment)  None  Hair  Reviewed  Eyes  Reviewed  Mouth  Reviewed  Skin  Reviewed  Nails  Reviewed       Diet Order:   Diet Order           Diet Heart Room service appropriate? Yes; Fluid consistency: Thin  Diet effective now          EDUCATION NEEDS:   Education needs have been addressed  Skin:  Skin Assessment: Reviewed RN Assessment  Last BM:  06/03/18  Height:   Ht Readings from Last 1 Encounters:  06/03/18 5\' 6"  (1.676 m)    Weight:   Wt Readings from Last 1 Encounters:  06/03/18 214 lb (97.1 kg)    Ideal Body Weight:  59.1 kg  BMI:  Body mass index is 34.54 kg/m.  Estimated Nutritional Needs:   Kcal:  1700-1900  Protein:  80-95 grams  Fluid:  1.7-1.9  L    Otillia Cordone A. Jimmye [place], RD, LDN, CDE Pager: [phone] After hours Pager: [phone]

## 2018-06-04 NOTE — Progress Notes (Signed)
PROGRESS NOTE    Miranda Hutchinson  RWE:[id number] DOB: Sep 15, 1946 DOA: 06/03/2018 PCP: Antony Contras, MD    Brief Narrative:  72 y.o. female with medical history significant of stage 3 CKD; hypothyroidism; HLD; CAD s/p CABG; carotid disease; and AAA s/p Bentall in 2013 presenting with L flank pain.  The pain started before her last admission.  It waxes and wanes.  It started bothering her again Tuesday.  This is the same pain as last time.  She also doesn't have an appetite.  No fevers.  No dysuria - she did have that prior but not this time.  She completed 10+ days of antibiotic prior to last hospitalization and then had another 4 days after the last hospitalization.  No respiratory symptoms.  No SOB.   She was hospitalized from 5/10-16 for septic shock from E coli UTI with left UPJ obstruction s/p PCN placement on 5/11.  She was discharged home on oral Keflex to complete a 10-day course.  Urine culture was positive for E coli on both 4/17 and 5/10, same resistance pattern (resistant to Amp, Bactrim, and Cipro).  ED Course:  AMS, back pain.  Normal lactate, no end organ damage.  Thought to have urinary source for fever.  UA is still pending.  CXR shows consolidation of the LLL.  Concern for sepsis from HCAP and maybe UTI.  Has had bolus and is on Cefepime and Vanc.  Assessment & Plan:   Principal Problem:   Flank pain Active Problems:   Coronary artery disease   Essential hypertension, benign   CKD (chronic kidney disease) stage 3, GFR 30-59 ml/min (HCC)   Complicated UTI (urinary tract infection)  L flank pain -Patient with recent admission for urosepsis and UPJ obstruction requiring nephrostomy tube presenting with L flank pain -Most likely source of infection is clearly UTI -There was some concern for HCAP given possible atelectasis vs. Infiltrate on CXR; this is not visualized on CT and the patient has no respiratory complaints or respiratory findings on exam and so pneumonia seems  exceedingly unlikely as the source at this time urine culture obtained, pending -Patient is continued on her cefepime/Vanc -We will culture results  HTN -ARB discontinued during last hospitalization -Controlled on Lopressor  CKD -Appears to be stable -Off Losartan since last hospitalization -Repeat basic metabolic panel in the morning  CAD -Stable s/p CABG -Also with h/o AAA s/p Bentall procedure -Continue Crestor as tolerated  DVT prophylaxis: Lovenox subcutaneously Code Status: Full code Family Communication: Room, family at bedside Disposition Plan: Uncertain at this time  Consultants:     Procedures:     Antimicrobials: Anti-infectives (From admission, onward)   Start     Dose/Rate Route Frequency Ordered Stop   06/04/18 1700  vancomycin (VANCOCIN) 1,250 mg in sodium chloride 0.9 % 250 mL IVPB  Status:  Discontinued     1,250 mg 166.7 mL/hr over 90 Minutes Intravenous Every 24 hours 06/03/18 1531 06/03/18 1814   06/04/18 1630  ceFEPIme (MAXIPIME) 2 g in sodium chloride 0.9 % 100 mL IVPB     2 g 200 mL/hr over 30 Minutes Intravenous Every 24 hours 06/03/18 1531     06/03/18 1430  ceFEPIme (MAXIPIME) 2 g in sodium chloride 0.9 % 100 mL IVPB     2 g 200 mL/hr over 30 Minutes Intravenous  Once 06/03/18 1422 06/03/18 1630   06/03/18 1430  vancomycin (VANCOCIN) 2,000 mg in sodium chloride 0.9 % 500 mL IVPB     2,000  mg 250 mL/hr over 120 Minutes Intravenous  Once 06/03/18 1422 06/03/18 1930       Subjective: Reports feeling better today  Objective: Vitals:   06/03/18 1816 06/03/18 1947 06/04/18 0529 06/04/18 1441  BP: 135/87 (!) 100/46 125/63   Pulse: (!) 113 100 89   Resp: (!) 22 18 16    Temp: (!) 103.1 F (39.5 C) (!) 101.2 F (38.4 C) 99.4 F (37.4 C) 99.1 F (37.3 C)  TempSrc: Oral Oral Oral   SpO2: 96% 97% 97%   Weight:      Height:        Intake/Output Summary (Last 24 hours) at 06/04/2018 1829 Last data filed at 06/04/2018 1738 Gross per 24  hour  Intake 3036.25 ml  Output 1300 ml  Net 1736.25 ml   Filed Weights   06/03/18 1345  Weight: 97.1 kg (214 lb)    Examination:  General exam: Appears calm and comfortable  Respiratory system: Clear to auscultation. Respiratory effort normal. Cardiovascular system: S1 & S2 heard, RRR.  Gastrointestinal system: Abdomen is nondistended, soft and nontender. No organomegaly or masses felt. Normal bowel sounds heard. Central nervous system: Alert and oriented. No focal neurological deficits. Extremities: Symmetric 5 x 5 power. Skin: No rashes, lesions  Psychiatry: Judgement and insight appear normal. Mood & affect appropriate.   Data Reviewed: I have personally reviewed following labs and imaging studies  CBC: Recent Labs  Lab 06/03/18 1425 06/04/18 0704  WBC 10.0 8.7  NEUTROABS 7.1  --   HGB 10.5* 9.3*  HCT 33.8* 29.2*  MCV 88.3 89.0  PLT 254 836   Basic Metabolic Panel: Recent Labs  Lab 06/03/18 1425 06/04/18 0704  NA 140 138  K 4.1 3.9  CL 105 109  CO2 26 23  GLUCOSE 129* 116*  BUN 14 12  CREATININE 1.78* 1.53*  CALCIUM 9.6 8.7*   GFR: Estimated Creatinine Clearance: 37.9 mL/min (A) (by C-G formula based on SCr of 1.53 mg/dL (H)). Liver Function Tests: Recent Labs  Lab 06/03/18 1425  AST 16  ALT 13*  ALKPHOS 64  BILITOT 0.8  PROT 8.0  ALBUMIN 3.6   No results for input(s): LIPASE, AMYLASE in the last 168 hours. No results for input(s): AMMONIA in the last 168 hours. Coagulation Profile: No results for input(s): INR, PROTIME in the last 168 hours. Cardiac Enzymes: No results for input(s): CKTOTAL, CKMB, CKMBINDEX, TROPONINI in the last 168 hours. BNP (last 3 results) Recent Labs    01/28/18 1539  PROBNP 67   HbA1C: No results for input(s): HGBA1C in the last 72 hours. CBG: Recent Labs  Lab 06/03/18 1336  GLUCAP 133*   Lipid Profile: No results for input(s): CHOL, HDL, LDLCALC, TRIG, CHOLHDL, LDLDIRECT in the last 72 hours. Thyroid  Function Tests: No results for input(s): TSH, T4TOTAL, FREET4, T3FREE, THYROIDAB in the last 72 hours. Anemia Panel: No results for input(s): VITAMINB12, FOLATE, FERRITIN, TIBC, IRON, RETICCTPCT in the last 72 hours. Sepsis Labs: Recent Labs  Lab 06/03/18 1419 06/03/18 1633  LATICACIDVEN 1.84 1.42    Recent Results (from the past 240 hour(s))  Blood Culture (routine x 2)     Status: None (Preliminary result)   Collection Time: 06/03/18  1:54 PM  Result Value Ref Range Status   Specimen Description BLOOD LEFT ANTECUBITAL  Final   Special Requests   Final    BOTTLES DRAWN AEROBIC AND ANAEROBIC Blood Culture adequate volume   Culture   Final    NO GROWTH <  24 HOURS Performed at [facility], [employer] [address] [place], [place] [address]    Report Status PENDING  Incomplete  Blood Culture (routine x 2)     Status: None (Preliminary result)   Collection Time: 06/03/18  2:15 PM  Result Value Ref Range Status   Specimen Description BLOOD RIGHT ANTECUBITAL  Final   Special Requests   Final    BOTTLES DRAWN AEROBIC AND ANAEROBIC Blood Culture results may not be optimal due to an inadequate volume of blood received in culture bottles   Culture   Final    NO GROWTH 1 DAY Performed at [facility], [place] [address], [place], [place] [address]    Report Status PENDING  Incomplete     Radiology Studies: Dg Chest 2 View  Result Date: 06/03/2018 CLINICAL DATA:  Pt c/o left-sided chest pain x 1 day. Hx of aortic insufficiency, ascending aortic aneurysm, CAD, and heart murmur. Pt is a former smoker. EXAM: CHEST - 2 VIEW COMPARISON:  05/12/2018 FINDINGS: Median sternotomy and valve replacement. The heart is mildly enlarged, accentuated by rotation towards the LEFT. There is stable elevation of the RIGHT hemidiaphragm. There is patchy density in the LEFT lung base partially obscuring the hemidiaphragm. No pulmonary edema. IMPRESSION: LEFT LOWER lobe opacity, consistent with atelectasis or  infiltrate. Cardiomegaly without pulmonary edema. Electronically Signed   By: Nolon Nations M.D.   On: 06/03/2018 14:53   Ct Renal Stone Study  Result Date: 06/03/2018 CLINICAL DATA:  LEFT-sided flank pain today. EXAM: CT ABDOMEN AND PELVIS WITHOUT CONTRAST TECHNIQUE: Multidetector CT imaging of the abdomen and pelvis was performed following the standard protocol without IV contrast. COMPARISON:  05/05/2018 CT FINDINGS: Lower chest: There is minimal RIGHT middle lobe atelectasis or scarring. There are focal calcifications within the coronary arteries. Heart size appears normal. No pericardial effusion. Hepatobiliary: The liver is diffusely low attenuation consistent with hepatic steatosis. The gallbladder is present and contains layering stones. Pancreas: Unremarkable. No pancreatic ductal dilatation or surrounding inflammatory changes. Spleen: Normal in size without focal abnormality. Adrenals/Urinary Tract: The patient has a new LEFT percutaneous nephrostomy decompressing the collecting system. There is a stable 8 millimeter calcification in the LOWER pole of the LEFT kidney, not causing obstruction. There is minimal perinephric stranding on the LEFT, likely related to recent obstruction and intervention. No ureteral stones are identified. The ureters are not obstructed. Numerous small phleboliths are identified in the pelvis. Normal appearance of the urinary bladder. The Stomach/Bowel: Numerous colonic diverticula without evidence for acute diverticulitis. The appendix is well seen and has a normal appearance. Small hiatal hernia. The stomach and small bowel loops are normal in appearance. Vascular/Lymphatic: There is extensive atherosclerotic calcification of the abdominal aorta. Proximal iliac arteries are aneurysmal, 2.2 centimeters on the LEFT and 2.1 centimeters on the RIGHT. No retroperitoneal or mesenteric adenopathy. Reproductive: Hysterectomy.  No adnexal mass. Other: Small density in the LEFT  anterior abdominal wall subcutaneous fat is 1.5 centimeters and likely represents an injection site. There is no free pelvic fluid. Musculoskeletal: Mild degenerative changes in the LOWER lumbar spine. IMPRESSION: 1. LEFT percutaneous nephrostomy decompresses the collecting system of the LEFT kidney. 2. No evidence for ureteral obstruction. 3. Colonic diverticulosis without acute diverticulitis. 4. Aortic atherosclerosis.  (ICD10-I70.0) 5. Hepatic steatosis. 6. Hysterectomy. 7. Cholelithiasis. Electronically Signed   By: Nolon Nations M.D.   On: 06/03/2018 15:41    Scheduled Meds: . cycloSPORINE  1 drop Both Eyes BID  . docusate sodium  100 mg Oral BID  . enoxaparin (LOVENOX) injection  40 mg Subcutaneous Q24H  . feeding supplement (ENSURE ENLIVE)  237 mL Oral TID BM  . metoprolol tartrate  25 mg Oral BID  . multivitamin with minerals  1 tablet Oral Daily  . rosuvastatin  40 mg Oral q1800   Continuous Infusions: . ceFEPime (MAXIPIME) IV Stopped (06/04/18 1738)  . lactated ringers 75 mL/hr at 06/04/18 1738     LOS: 1 day   Marylu Lund, MD [facility] Pager [phone]  If 7PM-7AM, please contact night-coverage [email or web address] Password [facility] 06/04/2018, 6:29 PM

## 2018-06-05 DIAGNOSIS — N183 Chronic kidney disease, stage 3 (moderate): Secondary | ICD-10-CM

## 2018-06-05 DIAGNOSIS — A419 Sepsis, unspecified organism: Principal | ICD-10-CM

## 2018-06-05 LAB — BASIC METABOLIC PANEL
ANION GAP: 6 (ref 5–15)
BUN: 11 mg/dL (ref 6–20)
CALCIUM: 9 mg/dL (ref 8.9–10.3)
CO2: 27 mmol/L (ref 22–32)
Chloride: 106 mmol/L (ref 101–111)
Creatinine, Ser: 1.41 mg/dL — ABNORMAL HIGH (ref 0.44–1.00)
GFR calc Af Amer: 41 mL/min — ABNORMAL LOW (ref >60)
GFR calc non Af Amer: 36 mL/min — ABNORMAL LOW (ref >60)
GLUCOSE: 104 mg/dL — AB (ref 65–99)
Potassium: 4.1 mmol/L (ref 3.5–5.1)
Sodium: 139 mmol/L (ref 135–145)

## 2018-06-05 LAB — CBC
HEMATOCRIT: 27.6 % — AB (ref 36.0–46.0)
HEMOGLOBIN: 8.6 g/dL — AB (ref 12.0–15.0)
MCH: 27.2 pg (ref 26.0–34.0)
MCHC: 31.2 g/dL (ref 30.0–36.0)
MCV: 87.3 fL (ref 78.0–100.0)
Platelets: 198 10*3/uL (ref 150–400)
RBC: 3.16 MIL/uL — ABNORMAL LOW (ref 3.87–5.11)
RDW: 15 % (ref 11.5–15.5)
WBC: 6.5 10*3/uL (ref 4.0–10.5)

## 2018-06-05 NOTE — Plan of Care (Signed)
  Problem: Activity: Goal: Risk for activity intolerance will decrease Outcome: Progressing   Problem: Pain Managment: Goal: General experience of comfort will improve Outcome: Progressing   

## 2018-06-05 NOTE — Progress Notes (Signed)
PROGRESS NOTE    Miranda Hutchinson  NAT:[id number] DOB: 1946-04-26 DOA: 06/03/2018 PCP: Antony Contras, MD    Brief Narrative:  72 y.o. female with medical history significant of stage 3 CKD; hypothyroidism; HLD; CAD s/p CABG; carotid disease; and AAA s/p Bentall in 2013 presenting with L flank pain.  The pain started before her last admission.  It waxes and wanes.  It started bothering her again Tuesday.  This is the same pain as last time.  She also doesn't have an appetite.  No fevers.  No dysuria - she did have that prior but not this time.  She completed 10+ days of antibiotic prior to last hospitalization and then had another 4 days after the last hospitalization.  No respiratory symptoms.  No SOB.   She was hospitalized from 5/10-16 for septic shock from E coli UTI with left UPJ obstruction s/p PCN placement on 5/11.  She was discharged home on oral Keflex to complete a 10-day course.  Urine culture was positive for E coli on both 4/17 and 5/10, same resistance pattern (resistant to Amp, Bactrim, and Cipro).  ED Course:  AMS, back pain.  Normal lactate, no end organ damage.  Thought to have urinary source for fever.  UA is still pending.  CXR shows consolidation of the LLL.  Concern for sepsis from HCAP and maybe UTI.  Has had bolus and is on Cefepime and Vanc.  Assessment & Plan:   Principal Problem:   Flank pain Active Problems:   Coronary artery disease   Essential hypertension, benign   CKD (chronic kidney disease) stage 3, GFR 30-59 ml/min (HCC)   Complicated UTI (urinary tract infection)  L flank pain with UTI and sepsis present on admission -Patient with recent admission for urosepsis and UPJ obstruction requiring nephrostomy tube presenting with L flank pain -Most likely source of infection is clearly UTI -There was some concern for HCAP given possible atelectasis vs. Infiltrate on CXR; this is not visualized on CT and the patient has no respiratory complaints or respiratory  findings on exam and so pneumonia seems exceedingly unlikely as the source at this time urine culture obtained -urine cx pos for pseudomonas, sensitivities pending -clinically improving with cefepime. Fevers resolved  HTN -ARB discontinued during last hospitalization -Controlled on Lopressor, continue as tolerated  CKD -Appears to be stable -Off Losartan since last hospitalization -Recheck bmp in AM  CAD -Stable s/p CABG -Also with h/o AAA s/p Bentall procedure -Patient continued on crestor  DVT prophylaxis: Lovenox subcutaneously Code Status: Full code Family Communication: Room, family at bedside Disposition Plan: Uncertain at this time  Consultants:     Procedures:     Antimicrobials: Anti-infectives (From admission, onward)   Start     Dose/Rate Route Frequency Ordered Stop   06/04/18 1700  vancomycin (VANCOCIN) 1,250 mg in sodium chloride 0.9 % 250 mL IVPB  Status:  Discontinued     1,250 mg 166.7 mL/hr over 90 Minutes Intravenous Every 24 hours 06/03/18 1531 06/03/18 1814   06/04/18 1630  ceFEPIme (MAXIPIME) 2 g in sodium chloride 0.9 % 100 mL IVPB     2 g 200 mL/hr over 30 Minutes Intravenous Every 24 hours 06/03/18 1531     06/03/18 1430  ceFEPIme (MAXIPIME) 2 g in sodium chloride 0.9 % 100 mL IVPB     2 g 200 mL/hr over 30 Minutes Intravenous  Once 06/03/18 1422 06/03/18 1630   06/03/18 1430  vancomycin (VANCOCIN) 2,000 mg in sodium chloride 0.9 %  500 mL IVPB     2,000 mg 250 mL/hr over 120 Minutes Intravenous  Once 06/03/18 1422 06/03/18 1930      Subjective: Reports feeling better. Eager to go home  Objective: Vitals:   06/04/18 1441 06/04/18 2142 06/05/18 0505 06/05/18 1416  BP:  (!) 128/46 (!) 127/52 (!) 142/85  Pulse:  75 72 72  Resp:  16 17   Temp: 99.1 F (37.3 C) 98.7 F (37.1 C) 99 F (37.2 C) 98.1 F (36.7 C)  TempSrc:  Oral Oral Oral  SpO2:  99% 98% 100%  Weight:      Height:        Intake/Output Summary (Last 24 hours) at  06/05/2018 1831 Last data filed at 06/05/2018 1630 Gross per 24 hour  Intake 1932.5 ml  Output 2025 ml  Net -92.5 ml   Filed Weights   06/03/18 1345  Weight: 97.1 kg (214 lb)    Examination: General exam: Conversant, in no acute distress Respiratory system: normal chest rise, clear, no audible wheezing Cardiovascular system: regular rhythm, s1-s2 Gastrointestinal system: Nondistended, nontender, pos BS Central nervous system: No seizures, no tremors Extremities: No cyanosis, no joint deformities Skin: No rashes, no pallor Psychiatry: Affect normal // no auditory hallucinations   Data Reviewed: I have personally reviewed following labs and imaging studies  CBC: Recent Labs  Lab 06/03/18 1425 06/04/18 0704 06/05/18 0439  WBC 10.0 8.7 6.5  NEUTROABS 7.1  --   --   HGB 10.5* 9.3* 8.6*  HCT 33.8* 29.2* 27.6*  MCV 88.3 89.0 87.3  PLT 254 199 025   Basic Metabolic Panel: Recent Labs  Lab 06/03/18 1425 06/04/18 0704 06/05/18 0439  NA 140 138 139  K 4.1 3.9 4.1  CL 105 109 106  CO2 26 23 27   GLUCOSE 129* 116* 104*  BUN 14 12 11   CREATININE 1.78* 1.53* 1.41*  CALCIUM 9.6 8.7* 9.0   GFR: Estimated Creatinine Clearance: 41.1 mL/min (A) (by C-G formula based on SCr of 1.41 mg/dL (H)). Liver Function Tests: Recent Labs  Lab 06/03/18 1425  AST 16  ALT 13*  ALKPHOS 64  BILITOT 0.8  PROT 8.0  ALBUMIN 3.6   No results for input(s): LIPASE, AMYLASE in the last 168 hours. No results for input(s): AMMONIA in the last 168 hours. Coagulation Profile: No results for input(s): INR, PROTIME in the last 168 hours. Cardiac Enzymes: No results for input(s): CKTOTAL, CKMB, CKMBINDEX, TROPONINI in the last 168 hours. BNP (last 3 results) Recent Labs    01/28/18 1539  PROBNP 67   HbA1C: No results for input(s): HGBA1C in the last 72 hours. CBG: Recent Labs  Lab 06/03/18 1336  GLUCAP 133*   Lipid Profile: No results for input(s): CHOL, HDL, LDLCALC, TRIG, CHOLHDL,  LDLDIRECT in the last 72 hours. Thyroid Function Tests: No results for input(s): TSH, T4TOTAL, FREET4, T3FREE, THYROIDAB in the last 72 hours. Anemia Panel: No results for input(s): VITAMINB12, FOLATE, FERRITIN, TIBC, IRON, RETICCTPCT in the last 72 hours. Sepsis Labs: Recent Labs  Lab 06/03/18 1419 06/03/18 1633  LATICACIDVEN 1.84 1.42    Recent Results (from the past 240 hour(s))  Blood Culture (routine x 2)     Status: None (Preliminary result)   Collection Time: 06/03/18  1:54 PM  Result Value Ref Range Status   Specimen Description BLOOD LEFT ANTECUBITAL  Final   Special Requests   Final    BOTTLES DRAWN AEROBIC AND ANAEROBIC Blood Culture adequate volume   Culture  Final    NO GROWTH 2 DAYS Performed at [facility], [place] [address], [place], [place] [address]    Report Status PENDING  Incomplete  Blood Culture (routine x 2)     Status: None (Preliminary result)   Collection Time: 06/03/18  2:15 PM  Result Value Ref Range Status   Specimen Description BLOOD RIGHT ANTECUBITAL  Final   Special Requests   Final    BOTTLES DRAWN AEROBIC AND ANAEROBIC Blood Culture results may not be optimal due to an inadequate volume of blood received in culture bottles   Culture   Final    NO GROWTH 2 DAYS Performed at [facility], [place] [address], [place], [place] [address]    Report Status PENDING  Incomplete  Urine culture     Status: Abnormal (Preliminary result)   Collection Time: 06/03/18  4:40 PM  Result Value Ref Range Status   Specimen Description URINE, CATHETERIZED  Final   Special Requests   Final    NONE Performed at [facility], [place] [address] [place], [place] [address]    Culture >=100,000 COLONIES/mL PSEUDOMONAS AERUGINOSA (A)  Final   Report Status PENDING  Incomplete     Radiology Studies: No results found.  Scheduled Meds: . cycloSPORINE  1 drop Both Eyes BID  . docusate sodium  100 mg Oral BID  . enoxaparin (LOVENOX) injection  40 mg  Subcutaneous Q24H  . feeding supplement (ENSURE ENLIVE)  237 mL Oral TID BM  . metoprolol tartrate  25 mg Oral BID  . multivitamin with minerals  1 tablet Oral Daily  . rosuvastatin  40 mg Oral q1800   Continuous Infusions: . ceFEPime (MAXIPIME) IV Stopped (06/05/18 1647)  . lactated ringers 75 mL/hr at 06/04/18 2153     LOS: 2 days   Marylu Lund, MD [facility] Pager [phone]  If 7PM-7AM, please contact night-coverage [email or web address] Password [facility] 06/05/2018, 6:31 PM

## 2018-06-06 DIAGNOSIS — N99528 Other complication of other external stoma of urinary tract: Secondary | ICD-10-CM

## 2018-06-06 DIAGNOSIS — N390 Urinary tract infection, site not specified: Secondary | ICD-10-CM

## 2018-06-06 LAB — BASIC METABOLIC PANEL
Anion gap: 7 (ref 5–15)
BUN: 13 mg/dL (ref 6–20)
CALCIUM: 9.1 mg/dL (ref 8.9–10.3)
CHLORIDE: 106 mmol/L (ref 101–111)
CO2: 28 mmol/L (ref 22–32)
CREATININE: 1.28 mg/dL — AB (ref 0.44–1.00)
GFR calc Af Amer: 47 mL/min — ABNORMAL LOW (ref >60)
GFR, EST NON AFRICAN AMERICAN: 40 mL/min — AB (ref >60)
Glucose, Bld: 110 mg/dL — ABNORMAL HIGH (ref 65–99)
Potassium: 4.1 mmol/L (ref 3.5–5.1)
SODIUM: 141 mmol/L (ref 135–145)

## 2018-06-06 LAB — URINE CULTURE: Culture: >=100000 — AB

## 2018-06-06 NOTE — Progress Notes (Signed)
PROGRESS NOTE    Miranda Hutchinson  GEZ:[id number] DOB: Jun 01, 1946 DOA: 06/03/2018 PCP: Antony Contras, MD    Brief Narrative:  72 y.o. female with medical history significant of stage 3 CKD; hypothyroidism; HLD; CAD s/p CABG; carotid disease; and AAA s/p Bentall in 2013 presenting with L flank pain.  The pain started before her last admission.  It waxes and wanes.  It started bothering her again Tuesday.  This is the same pain as last time.  She also doesn't have an appetite.  No fevers.  No dysuria - she did have that prior but not this time.  She completed 10+ days of antibiotic prior to last hospitalization and then had another 4 days after the last hospitalization.  No respiratory symptoms.  No SOB.   She was hospitalized from 5/10-16 for septic shock from E coli UTI with left UPJ obstruction s/p PCN placement on 5/11.  She was discharged home on oral Keflex to complete a 10-day course.  Urine culture was positive for E coli on both 4/17 and 5/10, same resistance pattern (resistant to Amp, Bactrim, and Cipro).  ED Course:  AMS, back pain.  Normal lactate, no end organ damage.  Thought to have urinary source for fever.  UA is still pending.  CXR shows consolidation of the LLL.  Concern for sepsis from HCAP and maybe UTI.  Has had bolus and is on Cefepime and Vanc.  Assessment & Plan:   Principal Problem:   Flank pain Active Problems:   Coronary artery disease   Essential hypertension, benign   CKD (chronic kidney disease) stage 3, GFR 30-59 ml/min (HCC)   Complicated UTI (urinary tract infection)  L flank pain with Pseudomonas UTI and sepsis present on admission -Patient with recent admission for urosepsis and UPJ obstruction requiring nephrostomy tube presenting with L flank pain -Most likely source of infection is clearly UTI -There was some concern for HCAP given possible atelectasis vs. Infiltrate on CXR; this is not visualized on CT and the patient has no respiratory complaints or  respiratory findings on exam and so pneumonia seems exceedingly unlikely as the source at this time urine culture obtained -urine cx pos for pseudomonas, pansensitive -Patient does have nephrostomy tube placed by IR at recent admission.  Will consult interventional radiology for possible replacement of nephrostomy tube  HTN -ARB discontinued during last hospitalization -Controlled on Lopressor, blood pressure remains stable  CKD -Appears to be stable -Off Losartan since last hospitalization secondary to acute renal failure -Repeat basic metabolic panel in the morning  CAD -Stable s/p CABG -Also with h/o AAA s/p Bentall procedure -Patient continued on crestor, no chest pain currently  DVT prophylaxis: Lovenox subcutaneously Code Status: Full code Family Communication: Room, family not at bedside Disposition Plan: Uncertain at this time  Consultants:     Procedures:     Antimicrobials: Anti-infectives (From admission, onward)   Start     Dose/Rate Route Frequency Ordered Stop   06/04/18 1700  vancomycin (VANCOCIN) 1,250 mg in sodium chloride 0.9 % 250 mL IVPB  Status:  Discontinued     1,250 mg 166.7 mL/hr over 90 Minutes Intravenous Every 24 hours 06/03/18 1531 06/03/18 1814   06/04/18 1630  ceFEPIme (MAXIPIME) 2 g in sodium chloride 0.9 % 100 mL IVPB     2 g 200 mL/hr over 30 Minutes Intravenous Every 24 hours 06/03/18 1531     06/03/18 1430  ceFEPIme (MAXIPIME) 2 g in sodium chloride 0.9 % 100 mL IVPB  2 g 200 mL/hr over 30 Minutes Intravenous  Once 06/03/18 1422 06/03/18 1630   06/03/18 1430  vancomycin (VANCOCIN) 2,000 mg in sodium chloride 0.9 % 500 mL IVPB     2,000 mg 250 mL/hr over 120 Minutes Intravenous  Once 06/03/18 1422 06/03/18 1930      Subjective: Reports feeling well today.  Patient is eager to go home  Objective: Vitals:   06/05/18 1416 06/05/18 2211 06/06/18 0458 06/06/18 1324  BP: (!) 142/85 (!) 132/52 (!) 144/67 (!) 100/58  Pulse: 72  79 62 71  Resp:  17 17 18   Temp: 98.1 F (36.7 C) 98.6 F (37 C) 98.3 F (36.8 C) 98.7 F (37.1 C)  TempSrc: Oral Oral Oral Oral  SpO2: 100% 99% 100% 100%  Weight:      Height:        Intake/Output Summary (Last 24 hours) at 06/06/2018 1905 Last data filed at 06/06/2018 1856 Gross per 24 hour  Intake 702 ml  Output 2050 ml  Net -1348 ml   Filed Weights   06/03/18 1345  Weight: 97.1 kg (214 lb)    Examination: General exam: Awake, laying in bed, in nad Respiratory system: Normal respiratory effort, no wheezing Cardiovascular system: regular rate, s1, s2 Gastrointestinal system: Soft, nondistended, positive BS Central nervous system: CN2-12 grossly intact, strength intact Extremities: Perfused, no clubbing Skin: Normal skin turgor, no notable skin lesions seen Psychiatry: Mood normal // no visual hallucinations    Data Reviewed: I have personally reviewed following labs and imaging studies  CBC: Recent Labs  Lab 06/03/18 1425 06/04/18 0704 06/05/18 0439  WBC 10.0 8.7 6.5  NEUTROABS 7.1  --   --   HGB 10.5* 9.3* 8.6*  HCT 33.8* 29.2* 27.6*  MCV 88.3 89.0 87.3  PLT 254 199 272   Basic Metabolic Panel: Recent Labs  Lab 06/03/18 1425 06/04/18 0704 06/05/18 0439 06/06/18 0526  NA 140 138 139 141  K 4.1 3.9 4.1 4.1  CL 105 109 106 106  CO2 26 23 27 28   GLUCOSE 129* 116* 104* 110*  BUN 14 12 11 13   CREATININE 1.78* 1.53* 1.41* 1.28*  CALCIUM 9.6 8.7* 9.0 9.1   GFR: Estimated Creatinine Clearance: 45.3 mL/min (A) (by C-G formula based on SCr of 1.28 mg/dL (H)). Liver Function Tests: Recent Labs  Lab 06/03/18 1425  AST 16  ALT 13*  ALKPHOS 64  BILITOT 0.8  PROT 8.0  ALBUMIN 3.6   No results for input(s): LIPASE, AMYLASE in the last 168 hours. No results for input(s): AMMONIA in the last 168 hours. Coagulation Profile: No results for input(s): INR, PROTIME in the last 168 hours. Cardiac Enzymes: No results for input(s): CKTOTAL, CKMB, CKMBINDEX,  TROPONINI in the last 168 hours. BNP (last 3 results) Recent Labs    01/28/18 1539  PROBNP 67   HbA1C: No results for input(s): HGBA1C in the last 72 hours. CBG: Recent Labs  Lab 06/03/18 1336  GLUCAP 133*   Lipid Profile: No results for input(s): CHOL, HDL, LDLCALC, TRIG, CHOLHDL, LDLDIRECT in the last 72 hours. Thyroid Function Tests: No results for input(s): TSH, T4TOTAL, FREET4, T3FREE, THYROIDAB in the last 72 hours. Anemia Panel: No results for input(s): VITAMINB12, FOLATE, FERRITIN, TIBC, IRON, RETICCTPCT in the last 72 hours. Sepsis Labs: Recent Labs  Lab 06/03/18 1419 06/03/18 1633  LATICACIDVEN 1.84 1.42    Recent Results (from the past 240 hour(s))  Blood Culture (routine x 2)     Status: None (Preliminary  result)   Collection Time: 06/03/18  1:54 PM  Result Value Ref Range Status   Specimen Description BLOOD LEFT ANTECUBITAL  Final   Special Requests   Final    BOTTLES DRAWN AEROBIC AND ANAEROBIC Blood Culture adequate volume   Culture   Final    NO GROWTH 3 DAYS Performed at [facility], [address] [place], [place] [address]    Report Status PENDING  Incomplete  Blood Culture (routine x 2)     Status: None (Preliminary result)   Collection Time: 06/03/18  2:15 PM  Result Value Ref Range Status   Specimen Description BLOOD RIGHT ANTECUBITAL  Final   Special Requests   Final    BOTTLES DRAWN AEROBIC AND ANAEROBIC Blood Culture results may not be optimal due to an inadequate volume of blood received in culture bottles   Culture   Final    NO GROWTH 3 DAYS Performed at [facility], [place] [address], [place], [place] [address]    Report Status PENDING  Incomplete  Urine culture     Status: Abnormal   Collection Time: 06/03/18  4:40 PM  Result Value Ref Range Status   Specimen Description URINE, CATHETERIZED  Final   Special Requests   Final    NONE Performed at [facility], [place] [address], [place], [place] [address]     Culture >=100,000 COLONIES/mL PSEUDOMONAS AERUGINOSA (A)  Final   Report Status 06/06/2018 FINAL  Final   Organism ID, Bacteria PSEUDOMONAS AERUGINOSA (A)  Final      Susceptibility   Pseudomonas aeruginosa - MIC*    CEFTAZIDIME 4 SENSITIVE Sensitive     CIPROFLOXACIN <=0.25 SENSITIVE Sensitive     GENTAMICIN <=1 SENSITIVE Sensitive     IMIPENEM 2 SENSITIVE Sensitive     PIP/TAZO 8 SENSITIVE Sensitive     CEFEPIME 4 SENSITIVE Sensitive     * >=100,000 COLONIES/mL PSEUDOMONAS AERUGINOSA     Radiology Studies: No results found.  Scheduled Meds: . cycloSPORINE  1 drop Both Eyes BID  . docusate sodium  100 mg Oral BID  . enoxaparin (LOVENOX) injection  40 mg Subcutaneous Q24H  . feeding supplement (ENSURE ENLIVE)  237 mL Oral TID BM  . metoprolol tartrate  25 mg Oral BID  . multivitamin with minerals  1 tablet Oral Daily  . rosuvastatin  40 mg Oral q1800   Continuous Infusions: . ceFEPime (MAXIPIME) IV Stopped (06/06/18 1709)  . lactated ringers 75 mL/hr at 06/05/18 2305     LOS: 3 days   Marylu Lund, MD [facility] Pager [phone]  If 7PM-7AM, please contact night-coverage [email or web address] Password [email or web address] 06/06/2018, 7:05 PM

## 2018-06-07 ENCOUNTER — Inpatient Hospital Stay (HOSPITAL_COMMUNITY): Payer: PPO

## 2018-06-07 ENCOUNTER — Encounter (HOSPITAL_COMMUNITY): Payer: Self-pay | Admitting: Interventional Radiology

## 2018-06-07 HISTORY — PX: IR NEPHROSTOMY EXCHANGE LEFT: IMG6069

## 2018-06-07 LAB — BASIC METABOLIC PANEL
Anion gap: 8 (ref 5–15)
BUN: 16 mg/dL (ref 6–20)
CALCIUM: 9.4 mg/dL (ref 8.9–10.3)
CHLORIDE: 106 mmol/L (ref 101–111)
CO2: 27 mmol/L (ref 22–32)
CREATININE: 1.3 mg/dL — AB (ref 0.44–1.00)
GFR, EST AFRICAN AMERICAN: 46 mL/min — AB (ref >60)
GFR, EST NON AFRICAN AMERICAN: 39 mL/min — AB (ref >60)
Glucose, Bld: 128 mg/dL — ABNORMAL HIGH (ref 65–99)
Potassium: 3.9 mmol/L (ref 3.5–5.1)
SODIUM: 141 mmol/L (ref 135–145)

## 2018-06-07 MED ORDER — LIDOCAINE HCL (PF) 1 % IJ SOLN
INTRAMUSCULAR | Status: DC | PRN
  Administered 2018-06-07: 5 mL

## 2018-06-07 MED ORDER — CIPROFLOXACIN HCL 500 MG PO TABS: 500.0000 mg | ORAL_TABLET | Freq: Two times a day (BID) | ORAL | 0 refills | Status: AC

## 2018-06-07 MED ORDER — IOPAMIDOL (ISOVUE-300) INJECTION 61%
INTRAVENOUS | Status: AC
  Administered 2018-06-07: 10 mL
  Filled 2018-06-07: qty 50

## 2018-06-07 MED ORDER — LACTULOSE 10 GM/15ML PO SOLN
30.0000 g | Freq: Once | ORAL | Status: AC
  Administered 2018-06-07: 30 g via ORAL
  Filled 2018-06-07: qty 45

## 2018-06-07 MED ORDER — LIDOCAINE HCL 1 % IJ SOLN
INTRAMUSCULAR | Status: AC
  Filled 2018-06-07: qty 20

## 2018-06-07 NOTE — Care Management Important Message (Signed)
Important Message  Patient Details  Name: Miranda Hutchinson MRN: [id number] Date of Birth: [birth year]   Medicare Important Message Given:  Yes    Orbie Pyo 06/07/2018, 4:12 PM

## 2018-06-07 NOTE — Progress Notes (Signed)
Patient Status: [facility] - In-pt  Assessment and Plan:  Will proceed with exchange of left PCN today by Dr. Kathlene Cote.  Risks and benefits of left PCN exchange were discussed with the patient including, but not limited to, infection, bleeding, significant bleeding causing loss or decrease in renal function or damage to adjacent structures.   All of the patient's questions were answered, patient is agreeable to proceed.  Consent signed and in chart.   ______________________________________________________________________   History of Present Illness: Miranda Hutchinson is a 72 y.o. female with left ureteral obstruction who had a left nephrostomy tube placed by Dr. Earleen Newport on 05/08/2018.  She presented to the ED on 06/03/2018 with flank pain  UA is positive for pseudomonas.  We are asked to exchange the left PCN.   Recent Vital Signs   BP (!) 128/93 (BP Location: Right Arm)   Pulse 68   Temp 98.4 F (36.9 C) (Oral)   Resp 18   Ht 5\' 6"  (1.676 m)   Wt 214 lb (97.1 kg)   SpO2 99%   BMI 34.54 kg/m    Past Medical History:  Diagnosis Date  . Aortic insufficiency    a. ascending aortic root aneursym with AI s/p Bentall procedure with bioprosthetic aortic valve 07/2012.  . Ascending aortic aneurysm ([place])    a. s/p Bentall 2013.  . Aspirin intolerance   . Bilateral carotid artery disease ([place])    a.  carotid duplex 2013 showed 40-59% distal RICA and 82-99% mLICA stenosis. b. Duplex 09/2017 - <40% BICA.  Marland Kitchen Coronary artery disease    a. 3 vessel ASCAD s/p CABG (LIMA to LAD, SVG to OM, SVG to PDA) at time of Bentall 07/2012.  Marland Kitchen Heart murmur   . Hyperlipidemia   . Hypothyroidism   . RBBB   . Renal insufficiency    a. Cr 1.31 in 2016, previously 1 range.     Allergies and medications reviewed.   Review of Systems: A 12 point ROS discussed and pertinent positives are indicated in the HPI above.  All other systems are negative. Review of Systems   Vital Signs: BP (!) 128/93  (BP Location: Right Arm)   Pulse 68   Temp 98.4 F (36.9 C) (Oral)   Resp 18   Ht 5\' 6"  (1.676 m)   Wt 214 lb (97.1 kg)   SpO2 99%   BMI 34.54 kg/m   Physical Exam  Constitutional: She is oriented to person, place, and time. She appears well-developed.  HENT:  Head: Normocephalic and atraumatic.  Eyes: EOM are normal.  Neck: Normal range of motion.  Cardiovascular: Normal rate.  Pulmonary/Chest: Effort normal.  Musculoskeletal: Normal range of motion.       Back:  Left PCN in place  Neurological: She is alert and oriented to person, place, and time.  Skin: Skin is warm and dry.  Psychiatric: She has a normal mood and affect. Her behavior is normal. Judgment and thought content normal.  Vitals reviewed.    Imaging reviewed.   Labs:  COAGS: Recent Labs    05/07/18 0601  INR 1.32  APTT 36    BMP: Recent Labs    06/04/18 0704 06/05/18 0439 06/06/18 0526 06/07/18 0622  NA 138 139 141 141  K 3.9 4.1 4.1 3.9  CL 109 106 106 106  CO2 23 27 28 27   GLUCOSE 116* 104* 110* 128*  BUN 12 11 13 16   CALCIUM 8.7* 9.0 9.1 9.4  CREATININE 1.53* 1.41*  1.28* 1.30*  GFRNONAA 32* 36* 40* 39*  GFRAA 38* 41* 47* 46*     Electronically Signed: Murrell Redden, PA-C 06/07/2018, 10:30 AM   I spent a total of 15 minutes in face to face in clinical consultation, greater than 50% of which was counseling/coordinating care for PCN exchange.

## 2018-06-07 NOTE — Procedures (Signed)
Interventional Radiology Procedure Note  Procedure: Exchange of left percutaneous nephrostomy tube   Complications: None  Estimated Blood Loss: < 10 mL  Findings: Left PCN exchanged for new 10 Fr tube.  Formed in renal pelvis and attached to gravity bag.  Venetia Night. Kathlene Cote, M.D Pager:  [phone]

## 2018-06-07 NOTE — Discharge Summary (Addendum)
Physician Discharge Summary  Miranda Hutchinson YBO:[id number] DOB: 1946/02/25 DOA: 06/03/2018  PCP: Antony Contras, MD  Admit date: 06/03/2018 Discharge date: 06/07/2018  Admitted From: Home Disposition:  Home  Recommendations for Outpatient Follow-up:  1. Follow up with PCP in 1-2 weeks 2. Follow up with Dr. Gloriann Loan as scheduled  Discharge Condition:Improved CODE STATUS:Full Diet recommendation: Regular   Brief/Interim Summary: 72 y.o.femalewith medical history significant ofstage 3 CKD; hypothyroidism; HLD; CAD s/p CABG; carotid disease; and AAA s/p Bentall in 2013 presenting withL flank pain. The pain started before her last admission. It waxes and wanes. It started bothering her again Tuesday. This is the same pain as last time. She also doesn't have an appetite. No fevers. No dysuria - she did have that prior but not this time. She completed 10+ days of antibiotic prior to last hospitalization and then had another 4 days after the last hospitalization. No respiratory symptoms. No SOB.   She was hospitalized from 5/10-16 for septic shock from E coli UTI with left UPJ obstruction s/p PCN placement on 5/11.She was discharged home on oral Keflex to complete a 10-day course. Urine culture was positive for E coli on both 4/17 and 5/10, same resistance pattern (resistant to Amp, Bactrim, and Cipro).  ED Course:AMS, back pain. Normal lactate, no end organ damage. Thought to have urinary source for fever. UA is still pending. CXR shows consolidation of the LLL. Concern for sepsis from HCAP and maybe UTI. Has had bolus and is on Cefepime and Vanc.  L flank pain with Pseudomonas UTI and sepsis present on admission -Patient with recent admission for urosepsis and UPJ obstruction requiring nephrostomy tube presenting with L flank pain -Most likely source of infection is clearly UTI -There was some concern for HCAP given possible atelectasis vs. Infiltrate on CXR; this is not  visualized on CT and the patient has no respiratory complaints or respiratory findings on exam and so pneumonia seems exceedingly unlikely as the source at this time urine culture obtained -urine cx pos for pseudomonas, pansensitive -Patient does have nephrostomy tube placed by IR at recent admission.   -Consulted IR and patient underwent nephrostomy tube replacement on 06/07/18\ -Will complete course of tx with cipro 500mg  PO BID on discharge. -Updated Dr. Gloriann Loan of patient's course over phone. Dr. Gloriann Loan to follow up on patient in one week (6/17)  HTN -ARB discontinued during last hospitalization -Controlled on Lopressor, blood pressure remains stable  CKD -Appears to be stable -Off Losartan since last hospitalization secondary to acute renal failure  CAD -Stable s/p CABG -Also with h/o AAA s/p Bentall procedure -Patient continued on crestor, no chest pain currently  Discharge Diagnoses:  Principal Problem:   Flank pain Active Problems:   Coronary artery disease   Essential hypertension, benign   CKD (chronic kidney disease) stage 3, GFR 30-59 ml/min (HCC)   Complicated UTI (urinary tract infection)    Discharge Instructions   Allergies as of 06/07/2018      Reactions   Percocet [oxycodone-acetaminophen] Other (See Comments)   Hallucinations    Asa [aspirin] Nausea Only, Other (See Comments)   "hurts my stomach"      Medication List    TAKE these medications   acetaminophen 325 MG tablet Commonly known as:  TYLENOL Take 2 tablets (650 mg total) by mouth every 6 (six) hours as needed for fever (temp >101).   ALIVE WOMENS 50+ PO Take 1 tablet by mouth daily.   ciprofloxacin 500 MG tablet Commonly known as:  CIPRO Take 1 tablet (500 mg total) by mouth 2 (two) times daily for 3 days. Start taking on:  06/08/2018   cycloSPORINE 0.05 % ophthalmic emulsion Commonly known as:  RESTASIS Place 1 drop into both eyes 2 (two) times daily.   metoprolol tartrate 25 MG  tablet Commonly known as:  LOPRESSOR Take 25 mg by mouth 2 (two) times daily.   rosuvastatin 40 MG tablet Commonly known as:  CRESTOR TAKE 1 TABLET(40 MG) BY MOUTH DAILY      Follow-up Information    Antony Contras, MD. Schedule an appointment as soon as possible for a visit in 2 week(s).   Specialty:  Family Medicine Contact information: [address], [address] [place] [address] [phone]        Lucas Mallow, MD Follow up.   Specialty:  Urology Why:  follow up as scheduled on 6/17 Contact information: [place] [address] [phone]          Allergies  Allergen Reactions  . Percocet [Oxycodone-Acetaminophen] Other (See Comments)    Hallucinations   . Asa [Aspirin] Nausea Only and Other (See Comments)    "hurts my stomach"    Consultations:  IR  Procedures/Studies: Dg Chest 2 View  Result Date: 06/03/2018 CLINICAL DATA:  Pt c/o left-sided chest pain x 1 day. Hx of aortic insufficiency, ascending aortic aneurysm, CAD, and heart murmur. Pt is a former smoker. EXAM: CHEST - 2 VIEW COMPARISON:  05/12/2018 FINDINGS: Median sternotomy and valve replacement. The heart is mildly enlarged, accentuated by rotation towards the LEFT. There is stable elevation of the RIGHT hemidiaphragm. There is patchy density in the LEFT lung base partially obscuring the hemidiaphragm. No pulmonary edema. IMPRESSION: LEFT LOWER lobe opacity, consistent with atelectasis or infiltrate. Cardiomegaly without pulmonary edema. Electronically Signed   By: Nolon Nations M.D.   On: 06/03/2018 14:53   Dg Chest 2 View  Result Date: 05/12/2018 CLINICAL DATA:  Chest pain. History of coronary artery disease and ascending aortic aneurysm with aortic valve replacement. EXAM: CHEST - 2 VIEW COMPARISON:  Portable chest x-ray of May 08, 2018 FINDINGS: The lungs are adequately inflated. There is a trace of fluid in the left lateral and posterior costophrenic angles. There  is no alveolar infiltrate or pleural effusion. The cardiac silhouette is mildly enlarged. The pulmonary vascularity is not engorged. The patient has undergone previous CABG and aortic valve replacement. There is calcification in the wall of the aortic arch. The bony thorax is unremarkable. IMPRESSION: Trace of fluid at the left lung base. No pulmonary edema or pneumonia. Mild enlargement of the cardiac silhouette without pulmonary vascular congestion. Thoracic aortic atherosclerosis. Electronically Signed   By: David  Martinique M.D.   On: 05/12/2018 12:20   Ct Renal Stone Study  Result Date: 06/03/2018 CLINICAL DATA:  LEFT-sided flank pain today. EXAM: CT ABDOMEN AND PELVIS WITHOUT CONTRAST TECHNIQUE: Multidetector CT imaging of the abdomen and pelvis was performed following the standard protocol without IV contrast. COMPARISON:  05/05/2018 CT FINDINGS: Lower chest: There is minimal RIGHT middle lobe atelectasis or scarring. There are focal calcifications within the coronary arteries. Heart size appears normal. No pericardial effusion. Hepatobiliary: The liver is diffusely low attenuation consistent with hepatic steatosis. The gallbladder is present and contains layering stones. Pancreas: Unremarkable. No pancreatic ductal dilatation or surrounding inflammatory changes. Spleen: Normal in size without focal abnormality. Adrenals/Urinary Tract: The patient has a new LEFT percutaneous nephrostomy decompressing the collecting system. There is a  stable 8 millimeter calcification in the LOWER pole of the LEFT kidney, not causing obstruction. There is minimal perinephric stranding on the LEFT, likely related to recent obstruction and intervention. No ureteral stones are identified. The ureters are not obstructed. Numerous small phleboliths are identified in the pelvis. Normal appearance of the urinary bladder. The Stomach/Bowel: Numerous colonic diverticula without evidence for acute diverticulitis. The appendix is well  seen and has a normal appearance. Small hiatal hernia. The stomach and small bowel loops are normal in appearance. Vascular/Lymphatic: There is extensive atherosclerotic calcification of the abdominal aorta. Proximal iliac arteries are aneurysmal, 2.2 centimeters on the LEFT and 2.1 centimeters on the RIGHT. No retroperitoneal or mesenteric adenopathy. Reproductive: Hysterectomy.  No adnexal mass. Other: Small density in the LEFT anterior abdominal wall subcutaneous fat is 1.5 centimeters and likely represents an injection site. There is no free pelvic fluid. Musculoskeletal: Mild degenerative changes in the LOWER lumbar spine. IMPRESSION: 1. LEFT percutaneous nephrostomy decompresses the collecting system of the LEFT kidney. 2. No evidence for ureteral obstruction. 3. Colonic diverticulosis without acute diverticulitis. 4. Aortic atherosclerosis.  (ICD10-I70.0) 5. Hepatic steatosis. 6. Hysterectomy. 7. Cholelithiasis. Electronically Signed   By: Nolon Nations M.D.   On: 06/03/2018 15:41   Ir Nephrostomy Exchange Left  Result Date: 06/07/2018 INDICATION: Status post left percutaneous nephrostomy tube placement on 05/08/2018 to treat sepsis and left UPJ obstruction. Due to current urinary tract infection and sepsis, request has been made to exchange the nephrostomy tube. EXAM: LEFT PERCUTANEOUS NEPHROSTOMY TUBE EXCHANGE COMPARISON:  05/08/2018 MEDICATIONS: None ANESTHESIA/SEDATION: None CONTRAST:  10 mL Isovue-300 - administered into the collecting system(s) FLUOROSCOPY TIME:  Fluoroscopy Time: 24 seconds.  4.0 mGy. COMPLICATIONS: None immediate. PROCEDURE: Informed written consent was obtained from the patient after a thorough discussion of the procedural risks, benefits and alternatives. All questions were addressed. Maximal Sterile Barrier Technique was utilized including caps, mask, sterile gowns, sterile gloves, sterile drape, hand hygiene and skin antiseptic. A timeout was performed prior to the  initiation of the procedure. The pre-existing 10 French percutaneous nephrostomy tube was injected with contrast material, cut and removed over a guidewire. A new 10 French nephrostomy tube was then advanced over the wire and formed. Catheter position was confirmed by a fluoroscopic spot image. The catheter was attached to gravity bag drainage. It was secured at the skin with a Prolene retention suture and StatLock device. FINDINGS: The new nephrostomy tube was formed at the level of the renal pelvis. IMPRESSION: Exchange of 10 French left percutaneous nephrostomy tube. Electronically Signed   By: Aletta Edouard M.D.   On: 06/07/2018 14:49     Subjective: Very eager to go home  Discharge Exam: Vitals:   06/06/18 2154 06/07/18 0559  BP: (!) 113/58 (!) 128/93  Pulse: 71 68  Resp: 18 18  Temp: 98.5 F (36.9 C) 98.4 F (36.9 C)  SpO2: 100% 99%   Vitals:   06/06/18 0458 06/06/18 1324 06/06/18 2154 06/07/18 0559  BP: (!) 144/67 (!) 100/58 (!) 113/58 (!) 128/93  Pulse: 62 71 71 68  Resp: 17 18 18 18   Temp: 98.3 F (36.8 C) 98.7 F (37.1 C) 98.5 F (36.9 C) 98.4 F (36.9 C)  TempSrc: Oral Oral Oral Oral  SpO2: 100% 100% 100% 99%  Weight:      Height:        General: Pt is alert, awake, not in acute distress Cardiovascular: RRR, S1/S2 +, no rubs, no gallops Respiratory: CTA bilaterally, no wheezing, no rhonchi  Abdominal: Soft, NT, ND, bowel sounds + Extremities: no edema, no cyanosis   The results of significant diagnostics from this hospitalization (including imaging, microbiology, ancillary and laboratory) are listed below for reference.     Microbiology: Recent Results (from the past 240 hour(s))  Blood Culture (routine x 2)     Status: None (Preliminary result)   Collection Time: 06/03/18  1:54 PM  Result Value Ref Range Status   Specimen Description BLOOD LEFT ANTECUBITAL  Final   Special Requests   Final    BOTTLES DRAWN AEROBIC AND ANAEROBIC Blood Culture adequate  volume   Culture   Final    NO GROWTH 4 DAYS Performed at [facility], [address] [place], [place] [address]    Report Status PENDING  Incomplete  Blood Culture (routine x 2)     Status: None (Preliminary result)   Collection Time: 06/03/18  2:15 PM  Result Value Ref Range Status   Specimen Description BLOOD RIGHT ANTECUBITAL  Final   Special Requests   Final    BOTTLES DRAWN AEROBIC AND ANAEROBIC Blood Culture results may not be optimal due to an inadequate volume of blood received in culture bottles   Culture   Final    NO GROWTH 4 DAYS Performed at [facility], [place] [address], [place], [place] [address]    Report Status PENDING  Incomplete  Urine culture     Status: Abnormal   Collection Time: 06/03/18  4:40 PM  Result Value Ref Range Status   Specimen Description URINE, CATHETERIZED  Final   Special Requests   Final    NONE Performed at [facility], [place] [address], [place], [place] [address]    Culture >=100,000 COLONIES/mL PSEUDOMONAS AERUGINOSA (A)  Final   Report Status 06/06/2018 FINAL  Final   Organism ID, Bacteria PSEUDOMONAS AERUGINOSA (A)  Final      Susceptibility   Pseudomonas aeruginosa - MIC*    CEFTAZIDIME 4 SENSITIVE Sensitive     CIPROFLOXACIN <=0.25 SENSITIVE Sensitive     GENTAMICIN <=1 SENSITIVE Sensitive     IMIPENEM 2 SENSITIVE Sensitive     PIP/TAZO 8 SENSITIVE Sensitive     CEFEPIME 4 SENSITIVE Sensitive     * >=100,000 COLONIES/mL PSEUDOMONAS AERUGINOSA     Labs: BNP (last 3 results) No results for input(s): BNP in the last 8760 hours. Basic Metabolic Panel: Recent Labs  Lab 06/03/18 1425 06/04/18 0704 06/05/18 0439 06/06/18 0526 06/07/18 0622  NA 140 138 139 141 141  K 4.1 3.9 4.1 4.1 3.9  CL 105 109 106 106 106  CO2 26 23 27 28 27   GLUCOSE 129* 116* 104* 110* 128*  BUN 14 12 11 13 16   CREATININE 1.78* 1.53* 1.41* 1.28* 1.30*  CALCIUM 9.6 8.7* 9.0 9.1 9.4   Liver Function Tests: Recent Labs  Lab  06/03/18 1425  AST 16  ALT 13*  ALKPHOS 64  BILITOT 0.8  PROT 8.0  ALBUMIN 3.6   No results for input(s): LIPASE, AMYLASE in the last 168 hours. No results for input(s): AMMONIA in the last 168 hours. CBC: Recent Labs  Lab 06/03/18 1425 06/04/18 0704 06/05/18 0439  WBC 10.0 8.7 6.5  NEUTROABS 7.1  --   --   HGB 10.5* 9.3* 8.6*  HCT 33.8* 29.2* 27.6*  MCV 88.3 89.0 87.3  PLT 254 199 198   Cardiac Enzymes: No results for input(s): CKTOTAL, CKMB, CKMBINDEX, TROPONINI in the last 168 hours. BNP:  Invalid input(s): POCBNP CBG: Recent Labs  Lab 06/03/18 1336  GLUCAP 133*   D-Dimer No results for input(s): DDIMER in the last 72 hours. Hgb A1c No results for input(s): HGBA1C in the last 72 hours. Lipid Profile No results for input(s): CHOL, HDL, LDLCALC, TRIG, CHOLHDL, LDLDIRECT in the last 72 hours. Thyroid function studies No results for input(s): TSH, T4TOTAL, T3FREE, THYROIDAB in the last 72 hours.  Invalid input(s): FREET3 Anemia work up No results for input(s): VITAMINB12, FOLATE, FERRITIN, TIBC, IRON, RETICCTPCT in the last 72 hours. Urinalysis    Component Value Date/Time   COLORURINE YELLOW 06/03/2018 1640   APPEARANCEUR HAZY (A) 06/03/2018 1640   LABSPEC 1.013 06/03/2018 1640   PHURINE 7.0 06/03/2018 1640   GLUCOSEU NEGATIVE 06/03/2018 1640   HGBUR MODERATE (A) 06/03/2018 1640   BILIRUBINUR NEGATIVE 06/03/2018 1640   KETONESUR NEGATIVE 06/03/2018 1640   PROTEINUR 100 (A) 06/03/2018 1640   UROBILINOGEN 0.2 11/10/2015 0001   NITRITE NEGATIVE 06/03/2018 1640   LEUKOCYTESUR LARGE (A) 06/03/2018 1640   Sepsis Labs Invalid input(s): PROCALCITONIN,  WBC,  LACTICIDVEN Microbiology Recent Results (from the past 240 hour(s))  Blood Culture (routine x 2)     Status: None (Preliminary result)   Collection Time: 06/03/18  1:54 PM  Result Value Ref Range Status   Specimen Description BLOOD LEFT ANTECUBITAL  Final   Special Requests   Final    BOTTLES DRAWN  AEROBIC AND ANAEROBIC Blood Culture adequate volume   Culture   Final    NO GROWTH 4 DAYS Performed at [facility], [place] [address], [place], [place] [address]    Report Status PENDING  Incomplete  Blood Culture (routine x 2)     Status: None (Preliminary result)   Collection Time: 06/03/18  2:15 PM  Result Value Ref Range Status   Specimen Description BLOOD RIGHT ANTECUBITAL  Final   Special Requests   Final    BOTTLES DRAWN AEROBIC AND ANAEROBIC Blood Culture results may not be optimal due to an inadequate volume of blood received in culture bottles   Culture   Final    NO GROWTH 4 DAYS Performed at [facility], [place] [address] [place], [place] [address]    Report Status PENDING  Incomplete  Urine culture     Status: Abnormal   Collection Time: 06/03/18  4:40 PM  Result Value Ref Range Status   Specimen Description URINE, CATHETERIZED  Final   Special Requests   Final    NONE Performed at [facility], [place] [address], [place], [place] [address]    Culture >=100,000 COLONIES/mL PSEUDOMONAS AERUGINOSA (A)  Final   Report Status 06/06/2018 FINAL  Final   Organism ID, Bacteria PSEUDOMONAS AERUGINOSA (A)  Final      Susceptibility   Pseudomonas aeruginosa - MIC*    CEFTAZIDIME 4 SENSITIVE Sensitive     CIPROFLOXACIN <=0.25 SENSITIVE Sensitive     GENTAMICIN <=1 SENSITIVE Sensitive     IMIPENEM 2 SENSITIVE Sensitive     PIP/TAZO 8 SENSITIVE Sensitive     CEFEPIME 4 SENSITIVE Sensitive     * >=100,000 COLONIES/mL PSEUDOMONAS AERUGINOSA   Time spent 79min  SIGNED:   Marylu Lund, MD  [facility] 06/07/2018, 4:34 PM  If 7PM-7AM, please contact night-coverage [email or web address] Password [email or web address]

## 2018-06-07 NOTE — Progress Notes (Signed)
Pharmacy Antibiotic Note  Miranda Hutchinson is a 71 y.o. female admitted on 06/03/2018 with sepsis.  Pharmacy has been consulted for cefepime dosing.   Day #5 of Cefepime for pseudomonas UTI. Has nephrostomy tube which was replaced on 6/10. Afebrile, WBC wnl. (Hx Ecoli UTI in May - non ESBL)  Plan: Continue Cefepime 2g IV q24h Monitor clinical picture, renal function F/U C&S, abx deescalation / LOT  Switch to PO soon? Stop abx after 7-10 days?  Height: 5\' 6"  (167.6 cm) Weight: 214 lb (97.1 kg) IBW/kg (Calculated) : 59.3  Temp (24hrs), Avg:98.5 F (36.9 C), Min:98.4 F (36.9 C), Max:98.5 F (36.9 C)  Recent Labs  Lab 06/03/18 1419 06/03/18 1425 06/03/18 1633 06/04/18 0704 06/05/18 0439 06/06/18 0526 06/07/18 0622  WBC  --  10.0  --  8.7 6.5  --   --   CREATININE  --  1.78*  --  1.53* 1.41* 1.28* 1.30*  LATICACIDVEN 1.84  --  1.42  --   --   --   --     Estimated Creatinine Clearance: 44.6 mL/min (A) (by C-G formula based on SCr of 1.3 mg/dL (H)).    Allergies  Allergen Reactions  . Percocet [Oxycodone-Acetaminophen] Other (See Comments)    Hallucinations   . Asa [Aspirin] Nausea Only and Other (See Comments)    "hurts my stomach"    Thank you for allowing pharmacy to be a part of this patient's care.  Elenor Quinones, PharmD, BCPS Clinical Pharmacist Phone number [phone] 06/07/2018 1:34 PM

## 2018-06-08 LAB — CULTURE, BLOOD (ROUTINE X 2)
Culture: NO GROWTH
Culture: NO GROWTH
Special Requests: ADEQUATE

## 2018-06-14 DIAGNOSIS — Q6211 Congenital occlusion of ureteropelvic junction: Secondary | ICD-10-CM | POA: Diagnosis not present

## 2018-06-15 ENCOUNTER — Other Ambulatory Visit: Payer: Self-pay | Admitting: Urology

## 2018-06-15 DIAGNOSIS — R5381 Other malaise: Secondary | ICD-10-CM | POA: Diagnosis not present

## 2018-06-15 DIAGNOSIS — R944 Abnormal results of kidney function studies: Secondary | ICD-10-CM | POA: Diagnosis not present

## 2018-06-15 DIAGNOSIS — Q6211 Congenital occlusion of ureteropelvic junction: Principal | ICD-10-CM

## 2018-06-15 DIAGNOSIS — E875 Hyperkalemia: Secondary | ICD-10-CM | POA: Diagnosis not present

## 2018-06-15 DIAGNOSIS — N135 Crossing vessel and stricture of ureter without hydronephrosis: Secondary | ICD-10-CM | POA: Diagnosis not present

## 2018-06-15 DIAGNOSIS — Z8619 Personal history of other infectious and parasitic diseases: Secondary | ICD-10-CM | POA: Diagnosis not present

## 2018-06-15 DIAGNOSIS — I2510 Atherosclerotic heart disease of native coronary artery without angina pectoris: Secondary | ICD-10-CM | POA: Diagnosis not present

## 2018-06-15 DIAGNOSIS — I10 Essential (primary) hypertension: Secondary | ICD-10-CM | POA: Diagnosis not present

## 2018-06-15 DIAGNOSIS — Q6239 Other obstructive defects of renal pelvis and ureter: Secondary | ICD-10-CM

## 2018-06-15 DIAGNOSIS — E7800 Pure hypercholesterolemia, unspecified: Secondary | ICD-10-CM | POA: Diagnosis not present

## 2018-06-15 DIAGNOSIS — D649 Anemia, unspecified: Secondary | ICD-10-CM | POA: Diagnosis not present

## 2018-06-17 NOTE — Progress Notes (Deleted)
Cardiology Office Note   Date:  06/17/2018   ID:  TAYAH IDROVO, DOB [birth year], MRN [id number]  PCP:  Antony Contras, MD  Cardiologist:   No primary care provider on file. Referring:  Antony Contras, MD  No chief complaint on file.     History of Present Illness: Miranda Hutchinson is a 72 y.o. female who presents for follow up of CAD.   She has a h/o of severe 3 vessel ASCAD s/p CABG (LIMA to LAD, SVG to OM, SVG to PDA), ascending aortic root aneursym with AI s/p Bentall procedure 07/2012 and dyslipidemia. She also has bilateral carotid artery disease. Echocardiogram 09/2017 showed normal LVEF, 55-60%.   She was admitted on 5/10 with a UTI.  She required a nephrostomy.  ***    She was in the ED yesterday with UTI.  She had a mild increase in creat.  She was found to have some hydronephrosis.  This is her biggest complaint this morning is the flank pain that brought her to the emergency room.  There was some confusion but we managed to find out from the urology office that she has a urology appointment follow-up today.  She was given pain medicines last night.  She is somnolent today.  She denies any ongoing chest discomfort today.  I reviewed the records from February when she was last seen in our office and she was describing some chest discomfort.  She was supposed to have a perfusion study but she did not want to have this done for reasons that are not clear to me.  She was seeing another cardiologist and is switching to me.  I have some records from her primary care and she was noted to have some chest discomfort in April and was set up with this appointment.  However, today she is not having this discomfort and she cannot quantify or qualify the discomfort that she was having.  She cannot tell me whether it is sharp or dull and she cannot tell me intensity or associated symptoms.  It is not clear to me how active she is at home but it seems like the discomfort comes and goes sporadically  with rest.  It is not clear that she has had this before.  She is had no new shortness of breath, PND or orthopnea.  She is had no new palpitations, presyncope or syncope.       Past Medical History:  Diagnosis Date  . Aortic insufficiency    a. ascending aortic root aneursym with AI s/p Bentall procedure with bioprosthetic aortic valve 07/2012.  . Ascending aortic aneurysm ([place])    a. s/p Bentall 2013.  . Aspirin intolerance   . Bilateral carotid artery disease ([place])    a.  carotid duplex 2013 showed 40-59% distal RICA and 54-00% mLICA stenosis. b. Duplex 09/2017 - <40% BICA.  Marland Kitchen Coronary artery disease    a. 3 vessel ASCAD s/p CABG (LIMA to LAD, SVG to OM, SVG to PDA) at time of Bentall 07/2012.  Marland Kitchen Heart murmur   . Hyperlipidemia   . Hypothyroidism   . RBBB   . Renal insufficiency    a. Cr 1.31 in 2016, previously 1 range.    Past Surgical History:  Procedure Laterality Date  . ABDOMINAL HYSTERECTOMY  1990's  . AORTIC VALVE REPLACEMENT  08/10/2012   Procedure: AORTIC VALVE REPLACEMENT (AVR);  Surgeon: Grace Isaac, MD;  Location: [place];  Service: Open Heart Surgery;  Laterality: N/A;  .  BREAST EXCISIONAL BIOPSY Left   . CARDIAC CATHETERIZATION  08/04/2012  . CORONARY ARTERY BYPASS GRAFT  08/10/2012   Procedure: CORONARY ARTERY BYPASS GRAFTING (CABG);  Surgeon: Grace Isaac, MD;  Location: [facility];  Service: Open Heart Surgery;  Laterality: N/A;  . IR NEPHROSTOMY EXCHANGE LEFT  06/07/2018  . IR NEPHROSTOMY PLACEMENT LEFT  05/08/2018  . THORACIC AORTIC ANEURYSM REPAIR  08/10/2012   Procedure: THORACIC ASCENDING ANEURYSM REPAIR (AAA);  Surgeon: Grace Isaac, MD;  Location: [facility];  Service: Open Heart Surgery;  Laterality: N/A;  . TONSILLECTOMY     "I was a little child"     Current Outpatient Medications  Medication Sig Dispense Refill  . acetaminophen (TYLENOL) 325 MG tablet Take 2 tablets (650 mg total) by mouth every 6 (six) hours as needed for fever (temp >101).      . cycloSPORINE (RESTASIS) 0.05 % ophthalmic emulsion Place 1 drop into both eyes 2 (two) times daily.     . metoprolol tartrate (LOPRESSOR) 25 MG tablet Take 25 mg by mouth 2 (two) times daily.    . Multiple Vitamins-Minerals (ALIVE WOMENS 50+ PO) Take 1 tablet by mouth daily.    . rosuvastatin (CRESTOR) 40 MG tablet TAKE 1 TABLET(40 MG) BY MOUTH DAILY 90 tablet 3   No current facility-administered medications for this visit.     Allergies:   Percocet [oxycodone-acetaminophen] and Asa [aspirin]    ROS:  Please see the history of present illness.   Otherwise, review of systems are positive for ***.   All other systems are reviewed and negative.    PHYSICAL EXAM: VS:  There were no vitals taken for this visit. , BMI There is no height or weight on file to calculate BMI.  GENERAL:  Well appearing NECK:  No jugular venous distention, waveform within normal limits, carotid upstroke brisk and symmetric, no bruits, no thyromegaly LUNGS:  Clear to auscultation bilaterally CHEST:  Unremarkable HEART:  PMI not displaced or sustained,S1 and S2 within normal limits, no S3, no S4, no clicks, no rubs, *** murmurs ABD:  Flat, positive bowel sounds normal in frequency in pitch, no bruits, no rebound, no guarding, no midline pulsatile mass, no hepatomegaly, no splenomegaly EXT:  2 plus pulses throughout, no edema, no cyanosis no clubbing   *** GEN:  No distress NECK:  No jugular venous distention at 90 degrees, waveform within normal limits, carotid upstroke brisk and symmetric, no bruits, no thyromegaly LYMPHATICS:  No cervical adenopathy LUNGS:  Clear to auscultation bilaterally BACK:  No CVA tenderness CHEST:  Well healed sternotomy scar. HEART:  S1 and S2 within normal limits, no S3, no S4, no clicks, no rubs, 2 out of 6 apical systolic murmur radiating out the aortic outflow tract, no diastolic murmurs ABD:  Positive bowel sounds normal in frequency in pitch, no bruits, no rebound, no  guarding, unable to assess midline mass or bruit with the patient seated. EXT:  2 plus pulses throughout, no edema, no cyanosis no clubbing SKIN:  No rashes no nodules NEURO:  Cranial nerves II through XII grossly intact, motor grossly intact throughout PSYCH:  Cognitively intact, oriented to person place and time     EKG:  EKG is *** ordered today. The ekg ordered today demonstrates sinus tachycardia, rate *** , right bundle branch block, no acute ST-T wave changes.   Recent Labs: 01/28/2018: NT-Pro BNP 67 05/07/2018: TSH 0.229 05/12/2018: Magnesium 1.7 06/03/2018: ALT 13 06/05/2018: Hemoglobin 8.6; Platelets 198 06/07/2018: BUN 16;  Creatinine, Ser 1.30; Potassium 3.9; Sodium 141    Lipid Panel    Component Value Date/Time   CHOL 176 10/06/2017 1400   TRIG 166 (H) 10/06/2017 1400   HDL 40 10/06/2017 1400   CHOLHDL 4.4 10/06/2017 1400   CHOLHDL 4.6 10/16/2015 0958   VLDL 18 10/16/2015 0958   LDLCALC 103 (H) 10/06/2017 1400      Wt Readings from Last 3 Encounters:  06/03/18 214 lb (97.1 kg)  05/06/18 214 lb (97.1 kg)  05/04/18 204 lb (92.5 kg)      Other studies Reviewed: Additional studies/ records that were reviewed today include: ***  Review of the above records demonstrates:  ***   ASSESSMENT AND PLAN:  Exertional Fatigue:     ***  She was to have a stress test.  I think this can be done electively to evaluate this and her vague chest discomfort.  However, she first needs to be followed up for her flank discomfort and hydronephrosis.  I discussed this with her grandson who accompanies her and they are going to get to the urology appointment which I think is the primary issue and I would like to see her back afterwards as below.  CAD:    ***  She has some vague chest discomfort but no objective evidence of ischemia.  This will be evaluated in the weeks to come with [facility].  She seems to have a stable atypical chest pain pattern at this point.  H/o ascending  aortic root aneursym with AI:   ***  She is status post Bentall procedure 07/2012. Recent echo 09/2017 stable with no AI.  ***  No change in therapy.   HTN:    Her blood pressure is ***  running low.  Her creatinine was mildly elevated.  And then reduce her Cozaar to 50 mg daily.  DLD:   LDL was not at target.  I will refer to Lipid Clinic. ***  after the next appt.   Bilateral Carotid Artery Disease:    She has less than 40% stenosis in October.  ***No further imaging at this point.      Current medicines are reviewed at length with the patient today.  The patient does not have concerns regarding medicines.  The following changes have been made:  ***  Labs/ tests ordered today include: ***  No orders of the defined types were placed in this encounter.    Disposition:   FU with me in *** weeks.     Signed, Minus Breeding, MD  06/17/2018 11:39 AM    [facility]

## 2018-06-18 ENCOUNTER — Ambulatory Visit: Payer: PPO | Admitting: Cardiology

## 2018-06-21 DIAGNOSIS — E875 Hyperkalemia: Secondary | ICD-10-CM | POA: Diagnosis not present

## 2018-06-22 ENCOUNTER — Encounter (HOSPITAL_COMMUNITY)
Admission: RE | Admit: 2018-06-22 | Discharge: 2018-06-22 | Disposition: A | Discharge status: Home / Self Care | Payer: PPO | Source: Ambulatory Visit | Attending: Urology | Admitting: Urology

## 2018-06-22 DIAGNOSIS — Q6211 Congenital occlusion of ureteropelvic junction: Secondary | ICD-10-CM | POA: Diagnosis not present

## 2018-06-22 DIAGNOSIS — Q6239 Other obstructive defects of renal pelvis and ureter: Secondary | ICD-10-CM

## 2018-06-22 DIAGNOSIS — N1330 Unspecified hydronephrosis: Secondary | ICD-10-CM | POA: Diagnosis not present

## 2018-06-22 MED ORDER — FUROSEMIDE 10 MG/ML IJ SOLN
49.0000 mg | Freq: Once | INTRAMUSCULAR | Status: AC
  Administered 2018-06-22: 49 mg via INTRAVENOUS

## 2018-06-22 MED ORDER — TECHNETIUM TC 99M MERTIATIDE
4.9000 | Freq: Once | INTRAVENOUS | Status: AC
  Administered 2018-06-22: 4.9 via INTRAVENOUS

## 2018-06-22 MED ORDER — FUROSEMIDE 10 MG/ML IJ SOLN
INTRAMUSCULAR | Status: AC
  Filled 2018-06-22: qty 8

## 2018-06-25 DIAGNOSIS — Q6211 Congenital occlusion of ureteropelvic junction: Secondary | ICD-10-CM | POA: Diagnosis not present

## 2018-07-05 DIAGNOSIS — R8271 Bacteriuria: Secondary | ICD-10-CM | POA: Diagnosis not present

## 2018-07-05 DIAGNOSIS — Q6211 Congenital occlusion of ureteropelvic junction: Secondary | ICD-10-CM | POA: Diagnosis not present

## 2018-07-06 ENCOUNTER — Other Ambulatory Visit: Payer: Self-pay | Admitting: Urology

## 2018-07-22 NOTE — Progress Notes (Signed)
Cardiology Office Note   Date:  07/23/2018   ID:  Miranda Hutchinson, DOB [birth year], MRN [id number]  PCP:  Antony Contras, MD  Cardiologist:   No primary care provider on file.   Chief Complaint  Patient presents with  . Coronary Artery Disease     History of Present Illness: Miranda Hutchinson is a 72 y.o. female who presents for follow up of CAD.   She has a h/o of severe 3 vessel ASCAD s/p CABG (LIMA to LAD, SVG to OM, SVG to PDA), ascending aortic root aneursym with AI s/p Bentall procedure 07/2012 and dyslipidemia. She also has bilateral carotid artery disease. Echocardiogram 09/2017 showed normal LVEF, 55-60%.  She has been hospitalized twice earlier this year with sepsis and hydronephrosis.    She now presents for follow-up.  She is going to have to have laparoscopic surgery.  She has a percutaneous nephrostomy tube at this point.  She actually is feeling much better than when I saw her prior to her hospitalizations.  Doing her chores of daily living.  She denies any cardiovascular symptoms. The patient denies any new symptoms such as chest discomfort, neck or arm discomfort. There has been no new shortness of breath, PND or orthopnea. There have been no reported palpitations, presyncope or syncope.  Of note she did have elevated cardiac enzymes when she was hospitalized.  Past Medical History:  Diagnosis Date  . Aortic insufficiency    a. ascending aortic root aneursym with AI s/p Bentall procedure with bioprosthetic aortic valve 07/2012.  . Ascending aortic aneurysm ([place])    a. s/p Bentall 2013.  . Aspirin intolerance   . Bilateral carotid artery disease ([place])    a.  carotid duplex 2013 showed 40-59% distal RICA and 11-91% mLICA stenosis. b. Duplex 09/2017 - <40% BICA.  Marland Kitchen Coronary artery disease    a. 3 vessel ASCAD s/p CABG (LIMA to LAD, SVG to OM, SVG to PDA) at time of Bentall 07/2012.  Marland Kitchen Heart murmur   . Hyperlipidemia   . Hypothyroidism   . RBBB   . Renal insufficiency      a. Cr 1.31 in 2016, previously 1 range.    Past Surgical History:  Procedure Laterality Date  . ABDOMINAL HYSTERECTOMY  1990's  . AORTIC VALVE REPLACEMENT  08/10/2012   Procedure: AORTIC VALVE REPLACEMENT (AVR);  Surgeon: Grace Isaac, MD;  Location: [facility];  Service: Open Heart Surgery;  Laterality: N/A;  . BREAST EXCISIONAL BIOPSY Left   . CARDIAC CATHETERIZATION  08/04/2012  . CORONARY ARTERY BYPASS GRAFT  08/10/2012   Procedure: CORONARY ARTERY BYPASS GRAFTING (CABG);  Surgeon: Grace Isaac, MD;  Location: [facility];  Service: Open Heart Surgery;  Laterality: N/A;  . IR NEPHROSTOMY EXCHANGE LEFT  06/07/2018  . IR NEPHROSTOMY PLACEMENT LEFT  05/08/2018  . THORACIC AORTIC ANEURYSM REPAIR  08/10/2012   Procedure: THORACIC ASCENDING ANEURYSM REPAIR (AAA);  Surgeon: Grace Isaac, MD;  Location: [facility];  Service: Open Heart Surgery;  Laterality: N/A;  . TONSILLECTOMY     "I was a little child"     Current Outpatient Medications  Medication Sig Dispense Refill  . acetaminophen (TYLENOL) 325 MG tablet Take 2 tablets (650 mg total) by mouth every 6 (six) hours as needed for fever (temp >101).    . cycloSPORINE (RESTASIS) 0.05 % ophthalmic emulsion Place 1 drop into both eyes 2 (two) times daily.     . metoprolol tartrate (LOPRESSOR) 25 MG tablet  Take 25 mg by mouth 2 (two) times daily.    . Multiple Vitamins-Minerals (ALIVE WOMENS 50+ PO) Take 1 tablet by mouth daily.    . rosuvastatin (CRESTOR) 40 MG tablet TAKE 1 TABLET(40 MG) BY MOUTH DAILY 90 tablet 3   No current facility-administered medications for this visit.     Allergies:   Percocet [oxycodone-acetaminophen] and Asa [aspirin]    ROS:  Please see the history of present illness.   Otherwise, review of systems are positive for none.   All other systems are reviewed and negative.    PHYSICAL EXAM: VS:  BP 126/62   Pulse 76   Ht 5\' 6"  (1.676 m)   Wt 197 lb 6.4 oz (89.5 kg)   BMI 31.86 kg/m  , BMI Body mass index is  31.86 kg/m.  GENERAL:  Well appearing NECK:  No jugular venous distention, waveform within normal limits, carotid upstroke brisk and symmetric, no bruits, no thyromegaly LUNGS:  Clear to auscultation bilaterally CHEST:  Well healed sternotomy scar. HEART:  PMI not displaced or sustained,S1 and S2 within normal limits, no S3, no S4, no clicks, no rubs, 3 of 6 apical systolic murmur radiating slightly at the aortic outflow tract, no diastolic murmurs ABD:  Flat, positive bowel sounds normal in frequency in pitch, no bruits, no rebound, no guarding, no midline pulsatile mass, no hepatomegaly, no splenomegaly EXT:  2 plus pulses throughout, no edema, no cyanosis no clubbing    EKG:  EKG is  ordered today. The ekg ordered today demonstrates sinus tachycardia, rate 76, right bundle branch block, no acute ST-T wave changes.   Recent Labs: 01/28/2018: NT-Pro BNP 67 05/07/2018: TSH 0.229 05/12/2018: Magnesium 1.7 06/03/2018: ALT 13 06/05/2018: Hemoglobin 8.6; Platelets 198 06/07/2018: BUN 16; Creatinine, Ser 1.30; Potassium 3.9; Sodium 141    Lipid Panel    Component Value Date/Time   CHOL 176 10/06/2017 1400   TRIG 166 (H) 10/06/2017 1400   HDL 40 10/06/2017 1400   CHOLHDL 4.4 10/06/2017 1400   CHOLHDL 4.6 10/16/2015 0958   VLDL 18 10/16/2015 0958   LDLCALC 103 (H) 10/06/2017 1400      Wt Readings from Last 3 Encounters:  07/23/18 197 lb 6.4 oz (89.5 kg)  06/03/18 214 lb (97.1 kg)  05/06/18 214 lb (97.1 kg)      Other studies Reviewed: Additional studies/ records that were reviewed today include: Hospital records Review of the above records demonstrates:  See elsewhere   ASSESSMENT AND PLAN:  Preop:    The patient will need screening.  She would not be able walk on a treadmill.  Therefore, she will have a [employer].  CAD:   She did have mildly elevated troponin during hospitalization for sepsis.  This will be evaluated as above.   H/o ascending aortic root aneursym  with AI:   The valve had stable function on echo in Oct.   I will follow with imaging in the future.   HTN:    Her blood pressure is at target.  Continue current therapy.   DLD:   LDL was mildly elevated.  I will check a fasting lipid when she returns.   not at target.  I will refer to Lipid Clinic after the next appt.   Bilateral Carotid Artery Disease:    She has less than 40% stenosis in October.  No further imaging at this time.     Current medicines are reviewed at length with the patient today.  The patient does  not have concerns regarding medicines.  The following changes have been made:  None  Labs/ tests ordered today include:     Orders Placed This Encounter  Procedures  . Lipid panel  . MYOCARDIAL PERFUSION IMAGING  . EKG 12-Lead     Disposition:   FU with me in 52 weeks.     Signed, Minus Breeding, MD  07/23/2018 2:45 PM     [facility]

## 2018-07-23 ENCOUNTER — Encounter: Payer: Self-pay | Admitting: Cardiology

## 2018-07-23 ENCOUNTER — Ambulatory Visit (INDEPENDENT_AMBULATORY_CARE_PROVIDER_SITE_OTHER): Payer: PPO | Admitting: Cardiology

## 2018-07-23 VITALS — BP 126/62 | HR 76 | Ht 66.0 in | Wt 197.4 lb

## 2018-07-23 DIAGNOSIS — R5381 Other malaise: Secondary | ICD-10-CM | POA: Diagnosis not present

## 2018-07-23 DIAGNOSIS — Z953 Presence of xenogenic heart valve: Secondary | ICD-10-CM | POA: Diagnosis not present

## 2018-07-23 DIAGNOSIS — Z01810 Encounter for preprocedural cardiovascular examination: Secondary | ICD-10-CM | POA: Diagnosis not present

## 2018-07-23 DIAGNOSIS — E7800 Pure hypercholesterolemia, unspecified: Secondary | ICD-10-CM | POA: Diagnosis not present

## 2018-07-23 DIAGNOSIS — N135 Crossing vessel and stricture of ureter without hydronephrosis: Secondary | ICD-10-CM | POA: Diagnosis not present

## 2018-07-23 DIAGNOSIS — I10 Essential (primary) hypertension: Secondary | ICD-10-CM | POA: Diagnosis not present

## 2018-07-23 DIAGNOSIS — D649 Anemia, unspecified: Secondary | ICD-10-CM | POA: Diagnosis not present

## 2018-07-23 DIAGNOSIS — Z8619 Personal history of other infectious and parasitic diseases: Secondary | ICD-10-CM | POA: Diagnosis not present

## 2018-07-23 DIAGNOSIS — I6523 Occlusion and stenosis of bilateral carotid arteries: Secondary | ICD-10-CM | POA: Diagnosis not present

## 2018-07-23 DIAGNOSIS — I2510 Atherosclerotic heart disease of native coronary artery without angina pectoris: Secondary | ICD-10-CM | POA: Diagnosis not present

## 2018-07-23 NOTE — Patient Instructions (Signed)
Medication Instructions:  Continue current medications  If you need a refill on your cardiac medications before your next appointment, please call your pharmacy.  Labwork: Fasting Lipids HERE IN OUR OFFICE AT [facility]  Take the provided lab slips with you to the lab for your blood draw.   You will need to fast. DO NOT EAT OR DRINK PAST MIDNIGHT.   Testing/Procedures: Your physician has requested that you have a lexiscan myoview. For further information please visit [email or web address]. Please follow instruction sheet, as given.   Follow-Up: Your physician wants you to follow-up in: 1 Year. You should receive a reminder letter in the mail two months in advance. If you do not receive a letter, please call our office [phone]      Thank you for choosing [facility] at [facility]!!

## 2018-07-27 ENCOUNTER — Telehealth (HOSPITAL_COMMUNITY): Payer: Self-pay

## 2018-07-27 NOTE — Telephone Encounter (Signed)
Encounter complete. 

## 2018-07-28 ENCOUNTER — Telehealth (HOSPITAL_COMMUNITY): Payer: Self-pay

## 2018-07-28 NOTE — Telephone Encounter (Signed)
Encounter complete. 

## 2018-07-29 ENCOUNTER — Ambulatory Visit (HOSPITAL_COMMUNITY)
Admission: RE | Admit: 2018-07-29 | Discharge: 2018-07-29 | Disposition: A | Discharge status: Home / Self Care | Payer: PPO | Source: Ambulatory Visit | Attending: Cardiology | Admitting: Cardiology

## 2018-07-29 DIAGNOSIS — I2510 Atherosclerotic heart disease of native coronary artery without angina pectoris: Secondary | ICD-10-CM | POA: Diagnosis not present

## 2018-07-29 LAB — MYOCARDIAL PERFUSION IMAGING
LV dias vol: 86 mL (ref 46–106)
LVSYSVOL: 39 mL
Peak HR: 83 {beats}/min
Rest HR: 62 {beats}/min
SDS: 8
SRS: 0
SSS: 8
TID: 1.13

## 2018-07-29 MED ORDER — TECHNETIUM TC 99M TETROFOSMIN IV KIT
32.3000 | PACK | Freq: Once | INTRAVENOUS | Status: AC | PRN
  Administered 2018-07-29: 32.3 via INTRAVENOUS
  Filled 2018-07-29: qty 33

## 2018-07-29 MED ORDER — TECHNETIUM TC 99M TETROFOSMIN IV KIT
10.1000 | PACK | Freq: Once | INTRAVENOUS | Status: AC | PRN
  Administered 2018-07-29: 10.1 via INTRAVENOUS
  Filled 2018-07-29: qty 11

## 2018-07-29 MED ORDER — REGADENOSON 0.4 MG/5ML IV SOLN
0.4000 mg | Freq: Once | INTRAVENOUS | Status: AC
  Administered 2018-07-29: 0.4 mg via INTRAVENOUS

## 2018-08-05 NOTE — Progress Notes (Addendum)
08-06-18 (Epic) Cardiac Clearanc from Dr. Percival Spanish on chart and in [facility]  07-23-18 (Epic) EKG and Surgical Clearance from Dr. Moreen Fowler on chart  07-29-18 (Epic) Stress  06-03-18 (Epic) CXR  09-29-17 (Epic) ECHO

## 2018-08-05 NOTE — Patient Instructions (Addendum)
Miranda Hutchinson  08/05/2018   Your procedure is scheduled on: 08-13-18   Report to [facility] Main  Entrance    Report to Admitting at 5:30 AM    Call this number if you have problems the morning of surgery [phone]   Remember: Do not eat food or drink liquids :After Midnight.     Take these medicines the morning of surgery with A SIP OF WATER: Metoprolol Tartrate (Lopressor)                                You may not have any metal on your body including hair pins and              piercings  Do not wear jewelry, make-up, lotions, powders or perfumes, deodorant             Do not wear nail polish.  Do not shave  48 hours prior to surgery.                Do not bring valuables to the hospital. [place].  Contacts, dentures or bridgework may not be worn into surgery.  Leave suitcase in the car. After surgery it may be brought to your room.     Patients discharged the day of surgery will not be allowed to drive home.  Name and phone number of your driver:  Special Instructions: N/A              Please read over the following fact sheets you were given: _____________________________________________________________________             [facility] - Preparing for Surgery Before surgery, you can play an important role.  Because skin is not sterile, your skin needs to be as free of germs as possible.  You can reduce the number of germs on your skin by washing with CHG (chlorahexidine gluconate) soap before surgery.  CHG is an antiseptic cleaner which kills germs and bonds with the skin to continue killing germs even after washing. Please DO NOT use if you have an allergy to CHG or antibacterial soaps.  If your skin becomes reddened/irritated stop using the CHG and inform your nurse when you arrive at Short Stay. Do not shave (including legs and underarms) for at least 48 hours prior to the first CHG shower.   You may shave your face/neck. Please follow these instructions carefully:  1.  Shower with CHG Soap the night before surgery and the  morning of Surgery.  2.  If you choose to wash your hair, wash your hair first as usual with your  normal  shampoo.  3.  After you shampoo, rinse your hair and body thoroughly to remove the  shampoo.                           4.  Use CHG as you would any other liquid soap.  You can apply chg directly  to the skin and wash                       Gently with a scrungie or clean washcloth.  5.  Apply the CHG Soap to  your body ONLY FROM THE NECK DOWN.   Do not use on face/ open                           Wound or open sores. Avoid contact with eyes, ears mouth and genitals (private parts).                       Wash face,  Genitals (private parts) with your normal soap.             6.  Wash thoroughly, paying special attention to the area where your surgery  will be performed.  7.  Thoroughly rinse your body with warm water from the neck down.  8.  DO NOT shower/wash with your normal soap after using and rinsing off  the CHG Soap.                9.  Pat yourself dry with a clean towel.            10.  Wear clean pajamas.            11.  Place clean sheets on your bed the night of your first shower and do not  sleep with pets. Day of Surgery : Do not apply any lotions/deodorants the morning of surgery.  Please wear clean clothes to the hospital/surgery center.  FAILURE TO FOLLOW THESE INSTRUCTIONS MAY RESULT IN THE CANCELLATION OF YOUR SURGERY PATIENT SIGNATURE_________________________________  NURSE SIGNATURE__________________________________  ________________________________________________________________________  WHAT IS A BLOOD TRANSFUSION? Blood Transfusion Information  A transfusion is the replacement of blood or some of its parts. Blood is made up of multiple cells which provide different functions.  Red blood cells carry oxygen and are used for blood  loss replacement.  White blood cells fight against infection.  Platelets control bleeding.  Plasma helps clot blood.  Other blood products are available for specialized needs, such as hemophilia or other clotting disorders. BEFORE THE TRANSFUSION  Who gives blood for transfusions?   Healthy volunteers who are fully evaluated to make sure their blood is safe. This is blood bank blood. Transfusion therapy is the safest it has ever been in the practice of medicine. Before blood is taken from a donor, a complete history is taken to make sure that person has no history of diseases nor engages in risky social behavior (examples are intravenous drug use or sexual activity with multiple partners). The donor's travel history is screened to minimize risk of transmitting infections, such as malaria. The donated blood is tested for signs of infectious diseases, such as HIV and hepatitis. The blood is then tested to be sure it is compatible with you in order to minimize the chance of a transfusion reaction. If you or a relative donates blood, this is often done in anticipation of surgery and is not appropriate for emergency situations. It takes many days to process the donated blood. RISKS AND COMPLICATIONS Although transfusion therapy is very safe and saves many lives, the main dangers of transfusion include:   Getting an infectious disease.  Developing a transfusion reaction. This is an allergic reaction to something in the blood you were given. Every precaution is taken to prevent this. The decision to have a blood transfusion has been considered carefully by your caregiver before blood is given. Blood is not given unless the benefits outweigh the risks. AFTER THE TRANSFUSION  Right after receiving a blood transfusion, you will usually  feel much better and more energetic. This is especially true if your red blood cells have gotten low (anemic). The transfusion raises the level of the red blood cells  which carry oxygen, and this usually causes an energy increase.  The nurse administering the transfusion will monitor you carefully for complications. HOME CARE INSTRUCTIONS  No special instructions are needed after a transfusion. You may find your energy is better. Speak with your caregiver about any limitations on activity for underlying diseases you may have. SEEK MEDICAL CARE IF:   Your condition is not improving after your transfusion.  You develop redness or irritation at the intravenous (IV) site. SEEK IMMEDIATE MEDICAL CARE IF:  Any of the following symptoms occur over the next 12 hours:  Shaking chills.  You have a temperature by mouth above 102 F (38.9 C), not controlled by medicine.  Chest, back, or muscle pain.  People around you feel you are not acting correctly or are confused.  Shortness of breath or difficulty breathing.  Dizziness and fainting.  You get a rash or develop hives.  You have a decrease in urine output.  Your urine turns a dark color or changes to pink, red, or brown. Any of the following symptoms occur over the next 10 days:  You have a temperature by mouth above 102 F (38.9 C), not controlled by medicine.  Shortness of breath.  Weakness after normal activity.  The white part of the eye turns yellow (jaundice).  You have a decrease in the amount of urine or are urinating less often.  Your urine turns a dark color or changes to pink, red, or brown. Document Released: 12/12/2000 Document Revised: 03/08/2012 Document Reviewed: 07/31/2008 [facility] Patient Information 2014 [place], [place].  _______________________________________________________________________

## 2018-08-06 ENCOUNTER — Encounter (HOSPITAL_COMMUNITY): Payer: Self-pay

## 2018-08-06 ENCOUNTER — Encounter (HOSPITAL_COMMUNITY)
Admission: RE | Admit: 2018-08-06 | Discharge: 2018-08-06 | Disposition: A | Discharge status: Home / Self Care | Payer: PPO | Source: Ambulatory Visit | Attending: Urology | Admitting: Urology

## 2018-08-06 ENCOUNTER — Other Ambulatory Visit: Payer: Self-pay

## 2018-08-06 ENCOUNTER — Telehealth: Payer: Self-pay | Admitting: Pharmacist

## 2018-08-06 ENCOUNTER — Telehealth: Payer: Self-pay | Admitting: Cardiology

## 2018-08-06 DIAGNOSIS — Z01812 Encounter for preprocedural laboratory examination: Secondary | ICD-10-CM | POA: Insufficient documentation

## 2018-08-06 DIAGNOSIS — N135 Crossing vessel and stricture of ureter without hydronephrosis: Secondary | ICD-10-CM | POA: Diagnosis not present

## 2018-08-06 LAB — COMPREHENSIVE METABOLIC PANEL
ALT: 15 U/L (ref 0–44)
ANION GAP: 7 (ref 5–15)
AST: 20 U/L (ref 15–41)
Albumin: 3.9 g/dL (ref 3.5–5.0)
Alkaline Phosphatase: 54 U/L (ref 38–126)
BUN: 17 mg/dL (ref 8–23)
CHLORIDE: 109 mmol/L (ref 98–111)
CO2: 27 mmol/L (ref 22–32)
Calcium: 10.1 mg/dL (ref 8.9–10.3)
Creatinine, Ser: 1.1 mg/dL — ABNORMAL HIGH (ref 0.44–1.00)
GFR calc Af Amer: 56 mL/min — ABNORMAL LOW (ref >60)
GFR, EST NON AFRICAN AMERICAN: 48 mL/min — AB (ref >60)
Glucose, Bld: 84 mg/dL (ref 70–99)
POTASSIUM: 4.3 mmol/L (ref 3.5–5.1)
Sodium: 143 mmol/L (ref 135–145)
Total Bilirubin: 0.5 mg/dL (ref 0.3–1.2)
Total Protein: 7.8 g/dL (ref 6.5–8.1)

## 2018-08-06 LAB — CBC
HCT: 37.3 % (ref 36.0–46.0)
Hemoglobin: 11.9 g/dL — ABNORMAL LOW (ref 12.0–15.0)
MCH: 28.7 pg (ref 26.0–34.0)
MCHC: 31.9 g/dL (ref 30.0–36.0)
MCV: 89.9 fL (ref 78.0–100.0)
PLATELETS: 290 10*3/uL (ref 150–400)
RBC: 4.15 MIL/uL (ref 3.87–5.11)
RDW: 14.7 % (ref 11.5–15.5)
WBC: 6.2 10*3/uL (ref 4.0–10.5)

## 2018-08-06 NOTE — Telephone Encounter (Signed)
Patient called and left message on pharmacist phone today at 4:49pm   *PLEASE send results of ECHO to Dr Louis Meckel (Urology) ASAP*  Needed prior to surgery on 08/13/2018.  Call patient  with update

## 2018-08-06 NOTE — Telephone Encounter (Signed)
Patient called with stress test results. She was not aware of them in [place]. Explained that MD has to comment on "clearance" status and then this will be sent to surgeon.

## 2018-08-06 NOTE — Telephone Encounter (Signed)
Routed to MD to note if patient is cleared for surgery Surgery is on 8/16

## 2018-08-06 NOTE — Telephone Encounter (Signed)
This message has been sent to MD to review and comment on clearance. Patient's results were released to MyChart by P. Raul Del RN

## 2018-08-06 NOTE — Telephone Encounter (Signed)
New message   Per Dolores Lory wants to know if the patient is cardiac cleared for the laporoscopic surgery that he mentioned in his office notes on 07/23/2018?  If you can please fax or make a note in epic of the clearance. Fax # [phone]

## 2018-08-06 NOTE — Progress Notes (Signed)
Per lab, pt has a hx of + antibodies. As result, labs has to be drawn the day of surgery.

## 2018-08-08 NOTE — Telephone Encounter (Signed)
For some reason her stress test results went to Dr. Radford Pax and not to me.  There was no ischemia.  Given this she is at acceptable risk for the planned surgery.  Please send to the surgeons for the clearance.

## 2018-08-09 LAB — TYPE AND SCREEN
ABO/RH(D): A NEG
ANTIBODY SCREEN: POSITIVE

## 2018-08-09 NOTE — Telephone Encounter (Signed)
MD notation of clearance sent via Epic fax to Dr. Louis Meckel & Dolores Lory @ [facility] pre-op. Also routed stress test results via Epic fax to Dr. Louis Meckel

## 2018-08-12 NOTE — Anesthesia Preprocedure Evaluation (Addendum)
Anesthesia Evaluation  Patient identified by MRN, date of birth, ID band Patient awake    Reviewed: Allergy & Precautions, H&P , NPO status , Patient's Chart, lab work & pertinent test results  Airway Mallampati: III  TM Distance: >3 FB Neck ROM: Full    Dental no notable dental hx. (+) Partial Upper, Dental Advisory Given, Chipped, Missing   Pulmonary neg pulmonary ROS, former smoker,    Pulmonary exam normal breath sounds clear to auscultation       Cardiovascular Exercise Tolerance: Good hypertension, + CAD, + CABG and + Peripheral Vascular Disease   Rhythm:Regular Rate:Normal     Neuro/Psych  Headaches, negative psych ROS   GI/Hepatic negative GI ROS, Neg liver ROS,   Endo/Other  Hypothyroidism   Renal/GU Renal InsufficiencyRenal disease  negative genitourinary   Musculoskeletal   Abdominal   Peds  Hematology negative hematology ROS (+)   Anesthesia Other Findings   Reproductive/Obstetrics negative OB ROS                           Anesthesia Physical Anesthesia Plan  ASA: III  Anesthesia Plan: General   Post-op Pain Management:    Induction: Intravenous  PONV Risk Score and Plan: 4 or greater and Ondansetron, Dexamethasone and Midazolam  Airway Management Planned: Oral ETT  Additional Equipment:   Intra-op Plan:   Post-operative Plan: Extubation in OR  Informed Consent: I have reviewed the patients History and Physical, chart, labs and discussed the procedure including the risks, benefits and alternatives for the proposed anesthesia with the patient or authorized representative who has indicated his/her understanding and acceptance.   Dental advisory given  Plan Discussed with: CRNA  Anesthesia Plan Comments:         Anesthesia Quick Evaluation

## 2018-08-13 ENCOUNTER — Inpatient Hospital Stay (HOSPITAL_COMMUNITY): Payer: PPO | Admitting: Anesthesiology

## 2018-08-13 ENCOUNTER — Encounter (HOSPITAL_COMMUNITY): Payer: Self-pay

## 2018-08-13 ENCOUNTER — Encounter (HOSPITAL_COMMUNITY)
Admission: RE | Disposition: A | Discharge status: Home / Self Care | Payer: Self-pay | Source: Ambulatory Visit | Attending: Urology

## 2018-08-13 ENCOUNTER — Observation Stay (HOSPITAL_COMMUNITY)
Admission: RE | Admit: 2018-08-13 | Discharge: 2018-08-14 | Disposition: A | Discharge status: Home / Self Care | Payer: PPO | Source: Ambulatory Visit | Attending: Urology | Admitting: Urology

## 2018-08-13 ENCOUNTER — Observation Stay (HOSPITAL_COMMUNITY): Payer: PPO

## 2018-08-13 DIAGNOSIS — N200 Calculus of kidney: Secondary | ICD-10-CM | POA: Diagnosis not present

## 2018-08-13 DIAGNOSIS — Z79899 Other long term (current) drug therapy: Secondary | ICD-10-CM | POA: Insufficient documentation

## 2018-08-13 DIAGNOSIS — I10 Essential (primary) hypertension: Secondary | ICD-10-CM | POA: Diagnosis not present

## 2018-08-13 DIAGNOSIS — Q6211 Congenital occlusion of ureteropelvic junction: Secondary | ICD-10-CM

## 2018-08-13 DIAGNOSIS — Q6239 Other obstructive defects of renal pelvis and ureter: Secondary | ICD-10-CM | POA: Diagnosis not present

## 2018-08-13 DIAGNOSIS — I2510 Atherosclerotic heart disease of native coronary artery without angina pectoris: Secondary | ICD-10-CM | POA: Diagnosis not present

## 2018-08-13 DIAGNOSIS — N135 Crossing vessel and stricture of ureter without hydronephrosis: Secondary | ICD-10-CM | POA: Diagnosis not present

## 2018-08-13 DIAGNOSIS — R109 Unspecified abdominal pain: Secondary | ICD-10-CM | POA: Diagnosis not present

## 2018-08-13 DIAGNOSIS — Z951 Presence of aortocoronary bypass graft: Secondary | ICD-10-CM | POA: Diagnosis not present

## 2018-08-13 DIAGNOSIS — I739 Peripheral vascular disease, unspecified: Secondary | ICD-10-CM | POA: Insufficient documentation

## 2018-08-13 DIAGNOSIS — Z87891 Personal history of nicotine dependence: Secondary | ICD-10-CM | POA: Insufficient documentation

## 2018-08-13 DIAGNOSIS — Z960 Presence of urogenital implants: Secondary | ICD-10-CM

## 2018-08-13 DIAGNOSIS — E039 Hypothyroidism, unspecified: Secondary | ICD-10-CM | POA: Insufficient documentation

## 2018-08-13 HISTORY — PX: FLEXIBLE URETEROSCOPY: SHX5833

## 2018-08-13 HISTORY — PX: ROBOT ASSISTED PYELOPLASTY: SHX5143

## 2018-08-13 LAB — BASIC METABOLIC PANEL
Anion gap: 9 (ref 5–15)
BUN: 21 mg/dL (ref 8–23)
CHLORIDE: 111 mmol/L (ref 98–111)
CO2: 22 mmol/L (ref 22–32)
CREATININE: 1.05 mg/dL — AB (ref 0.44–1.00)
Calcium: 8.8 mg/dL — ABNORMAL LOW (ref 8.9–10.3)
GFR calc Af Amer: 59 mL/min — ABNORMAL LOW (ref >60)
GFR calc non Af Amer: 51 mL/min — ABNORMAL LOW (ref >60)
GLUCOSE: 122 mg/dL — AB (ref 70–99)
POTASSIUM: 4.6 mmol/L (ref 3.5–5.1)
SODIUM: 142 mmol/L (ref 135–145)

## 2018-08-13 LAB — HEMOGLOBIN AND HEMATOCRIT, BLOOD
HEMATOCRIT: 41 % (ref 36.0–46.0)
HEMOGLOBIN: 12.7 g/dL (ref 12.0–15.0)

## 2018-08-13 SURGERY — XI ROBOTIC ASSISTED PYELOPLASTY WITH STENT PLACEMENT
Anesthesia: General | Laterality: Left

## 2018-08-13 MED ORDER — SODIUM CHLORIDE 0.9 % IJ SOLN
INTRAMUSCULAR | Status: AC
  Filled 2018-08-13: qty 10

## 2018-08-13 MED ORDER — LIDOCAINE 2% (20 MG/ML) 5 ML SYRINGE
INTRAMUSCULAR | Status: DC | PRN
  Administered 2018-08-13: 40 mg via INTRAVENOUS

## 2018-08-13 MED ORDER — LACTATED RINGERS IV SOLN
INTRAVENOUS | Status: DC
  Administered 2018-08-13 (×3): via INTRAVENOUS
  Administered 2018-08-13: 1000 mL via INTRAVENOUS

## 2018-08-13 MED ORDER — PHENYLEPHRINE HCL-NACL 10-0.9 MG/250ML-% IV SOLN
INTRAVENOUS | Status: AC
  Filled 2018-08-13: qty 250

## 2018-08-13 MED ORDER — SODIUM CHLORIDE 0.9 % IV SOLN
2.0000 g | INTRAVENOUS | Status: AC
  Administered 2018-08-13: 2 g via INTRAVENOUS

## 2018-08-13 MED ORDER — DEXAMETHASONE SODIUM PHOSPHATE 10 MG/ML IJ SOLN
INTRAMUSCULAR | Status: DC | PRN
  Administered 2018-08-13: 10 mg via INTRAVENOUS

## 2018-08-13 MED ORDER — CYCLOSPORINE 0.05 % OP EMUL
1.0000 [drp] | Freq: Two times a day (BID) | OPHTHALMIC | Status: DC
  Administered 2018-08-14: 1 [drp] via OPHTHALMIC
  Filled 2018-08-13 (×2): qty 1

## 2018-08-13 MED ORDER — ONDANSETRON HCL 4 MG/2ML IJ SOLN
INTRAMUSCULAR | Status: DC | PRN
  Administered 2018-08-13 (×2): 4 mg via INTRAVENOUS

## 2018-08-13 MED ORDER — BUPIVACAINE LIPOSOME 1.3 % IJ SUSP
20.0000 mL | Freq: Once | INTRAMUSCULAR | Status: AC
  Administered 2018-08-13: 20 mL
  Filled 2018-08-13: qty 20

## 2018-08-13 MED ORDER — HYDROMORPHONE HCL 1 MG/ML IJ SOLN: 0.2500 mg | INTRAMUSCULAR | Status: DC | PRN

## 2018-08-13 MED ORDER — TRAMADOL HCL 50 MG PO TABS: 50.0000 mg | ORAL_TABLET | Freq: Four times a day (QID) | ORAL | 0 refills | Status: DC | PRN

## 2018-08-13 MED ORDER — OXYCODONE HCL 5 MG PO TABS: 5.0000 mg | ORAL_TABLET | ORAL | Status: DC | PRN

## 2018-08-13 MED ORDER — SODIUM CHLORIDE 0.9 % IV SOLN
INTRAVENOUS | Status: DC | PRN
  Administered 2018-08-13: 10 ug/min via INTRAVENOUS

## 2018-08-13 MED ORDER — METHYLENE BLUE 0.5 % INJ SOLN
INTRAVENOUS | Status: AC
  Filled 2018-08-13: qty 10

## 2018-08-13 MED ORDER — KETOROLAC TROMETHAMINE 15 MG/ML IJ SOLN
15.0000 mg | Freq: Four times a day (QID) | INTRAMUSCULAR | Status: DC | PRN
  Administered 2018-08-13: 15 mg via INTRAVENOUS
  Filled 2018-08-13: qty 1

## 2018-08-13 MED ORDER — SODIUM CHLORIDE 0.9 % IR SOLN
Freq: Once | Status: AC
  Administered 2018-08-13: 90 mL
  Filled 2018-08-13 (×5): qty 2

## 2018-08-13 MED ORDER — HYDROMORPHONE HCL 1 MG/ML IJ SOLN
0.5000 mg | INTRAMUSCULAR | Status: DC | PRN
  Administered 2018-08-13: 1 mg via INTRAVENOUS
  Filled 2018-08-13: qty 1

## 2018-08-13 MED ORDER — ROCURONIUM BROMIDE 10 MG/ML (PF) SYRINGE
PREFILLED_SYRINGE | INTRAVENOUS | Status: DC | PRN
  Administered 2018-08-13 (×2): 10 mg via INTRAVENOUS
  Administered 2018-08-13: 20 mg via INTRAVENOUS
  Administered 2018-08-13: 10 mg via INTRAVENOUS
  Administered 2018-08-13: 50 mg via INTRAVENOUS
  Administered 2018-08-13 (×2): 10 mg via INTRAVENOUS

## 2018-08-13 MED ORDER — PROPOFOL 10 MG/ML IV BOLUS
INTRAVENOUS | Status: DC | PRN
  Administered 2018-08-13: 100 mg via INTRAVENOUS

## 2018-08-13 MED ORDER — SODIUM CHLORIDE 0.9 % IJ SOLN
INTRAMUSCULAR | Status: AC
  Filled 2018-08-13: qty 20

## 2018-08-13 MED ORDER — WATER FOR IRRIGATION, STERILE IR SOLN
Status: DC | PRN
  Administered 2018-08-13: 1000 mL

## 2018-08-13 MED ORDER — OXYBUTYNIN CHLORIDE 5 MG PO TABS
5.0000 mg | ORAL_TABLET | Freq: Three times a day (TID) | ORAL | Status: DC | PRN
  Filled 2018-08-13: qty 1

## 2018-08-13 MED ORDER — FENTANYL CITRATE (PF) 100 MCG/2ML IJ SOLN
INTRAMUSCULAR | Status: AC
  Filled 2018-08-13: qty 2

## 2018-08-13 MED ORDER — ROCURONIUM BROMIDE 10 MG/ML (PF) SYRINGE
PREFILLED_SYRINGE | INTRAVENOUS | Status: AC
  Filled 2018-08-13: qty 10

## 2018-08-13 MED ORDER — FENTANYL CITRATE (PF) 250 MCG/5ML IJ SOLN
INTRAMUSCULAR | Status: AC
  Filled 2018-08-13: qty 5

## 2018-08-13 MED ORDER — ONDANSETRON HCL 4 MG/2ML IJ SOLN: 4.0000 mg | INTRAMUSCULAR | Status: DC | PRN

## 2018-08-13 MED ORDER — SUGAMMADEX SODIUM 500 MG/5ML IV SOLN
INTRAVENOUS | Status: DC | PRN
  Administered 2018-08-13: 250 mg via INTRAVENOUS

## 2018-08-13 MED ORDER — SODIUM CHLORIDE 0.9 % IJ SOLN
INTRAMUSCULAR | Status: DC | PRN
  Administered 2018-08-13: 20 mL

## 2018-08-13 MED ORDER — FENTANYL CITRATE (PF) 250 MCG/5ML IJ SOLN
INTRAMUSCULAR | Status: DC | PRN
  Administered 2018-08-13 (×2): 100 ug via INTRAVENOUS
  Administered 2018-08-13: 25 ug via INTRAVENOUS
  Administered 2018-08-13 (×5): 50 ug via INTRAVENOUS
  Administered 2018-08-13: 25 ug via INTRAVENOUS

## 2018-08-13 MED ORDER — SODIUM CHLORIDE 0.9 % IV SOLN
INTRAVENOUS | Status: AC
  Filled 2018-08-13: qty 20

## 2018-08-13 MED ORDER — KETOROLAC TROMETHAMINE 15 MG/ML IJ SOLN
15.0000 mg | Freq: Four times a day (QID) | INTRAMUSCULAR | Status: DC
  Administered 2018-08-13 – 2018-08-14 (×2): 15 mg via INTRAVENOUS
  Filled 2018-08-13 (×2): qty 1

## 2018-08-13 MED ORDER — LACTATED RINGERS IR SOLN
Status: DC | PRN
  Administered 2018-08-13: 1000 mL

## 2018-08-13 MED ORDER — CEFAZOLIN SODIUM-DEXTROSE 1-4 GM/50ML-% IV SOLN
1.0000 g | Freq: Three times a day (TID) | INTRAVENOUS | Status: AC
Start: 2018-08-13 — End: 2018-08-14
  Administered 2018-08-13 – 2018-08-14 (×2): 1 g via INTRAVENOUS
  Filled 2018-08-13 (×2): qty 50

## 2018-08-13 MED ORDER — METOPROLOL TARTRATE 25 MG PO TABS
25.0000 mg | ORAL_TABLET | Freq: Two times a day (BID) | ORAL | Status: DC
  Administered 2018-08-14: 25 mg via ORAL
  Filled 2018-08-13: qty 1

## 2018-08-13 MED ORDER — ACETAMINOPHEN 500 MG PO TABS
1000.0000 mg | ORAL_TABLET | Freq: Four times a day (QID) | ORAL | Status: DC
  Administered 2018-08-13: 1000 mg via ORAL
  Filled 2018-08-13: qty 2

## 2018-08-13 MED ORDER — ROSUVASTATIN CALCIUM 20 MG PO TABS
40.0000 mg | ORAL_TABLET | Freq: Every day | ORAL | Status: DC
  Administered 2018-08-14: 40 mg via ORAL
  Filled 2018-08-13: qty 2

## 2018-08-13 MED ORDER — SODIUM CHLORIDE 0.9 % IR SOLN
Status: DC | PRN
  Administered 2018-08-13 (×2): 1000 mL via INTRAVESICAL

## 2018-08-13 MED ORDER — ACETAMINOPHEN 10 MG/ML IV SOLN
1000.0000 mg | Freq: Four times a day (QID) | INTRAVENOUS | Status: DC
  Administered 2018-08-13 – 2018-08-14 (×3): 1000 mg via INTRAVENOUS
  Filled 2018-08-13 (×6): qty 100

## 2018-08-13 MED ORDER — MIDAZOLAM HCL 2 MG/2ML IJ SOLN
INTRAMUSCULAR | Status: AC
  Filled 2018-08-13: qty 2

## 2018-08-13 MED ORDER — METHYLENE BLUE 0.5 % INJ SOLN
INTRAVENOUS | Status: DC | PRN
  Administered 2018-08-13 (×2): 1 mL

## 2018-08-13 MED ORDER — SODIUM CHLORIDE 0.9 % IV SOLN
1.0000 g | INTRAVENOUS | Status: DC
  Filled 2018-08-13: qty 10

## 2018-08-13 MED ORDER — MIDAZOLAM HCL 2 MG/2ML IJ SOLN
INTRAMUSCULAR | Status: DC | PRN
  Administered 2018-08-13 (×2): 0.5 mg via INTRAVENOUS

## 2018-08-13 MED ORDER — PROPOFOL 10 MG/ML IV BOLUS
INTRAVENOUS | Status: AC
  Filled 2018-08-13: qty 20

## 2018-08-13 MED ORDER — DEXTROSE-NACL 5-0.45 % IV SOLN
INTRAVENOUS | Status: DC
  Administered 2018-08-13 (×2): via INTRAVENOUS

## 2018-08-13 SURGICAL SUPPLY — 59 items
CATH FOLEY 3WAY  5CC 16FR (CATHETERS) ×2
CATH FOLEY 3WAY  5CC 18FR (CATHETERS) ×2
CATH FOLEY 3WAY 5CC 16FR (CATHETERS) ×1 IMPLANT
CATH FOLEY 3WAY 5CC 18FR (CATHETERS) ×1 IMPLANT
CHLORAPREP W/TINT 26ML (MISCELLANEOUS) ×3 IMPLANT
CLIP VESOLOCK LG 6/CT PURPLE (CLIP) ×6 IMPLANT
CLIP VESOLOCK MED LG 6/CT (CLIP) IMPLANT
COVER SURGICAL LIGHT HANDLE (MISCELLANEOUS) ×3 IMPLANT
COVER TIP SHEARS 8 DVNC (MISCELLANEOUS) ×1 IMPLANT
COVER TIP SHEARS 8MM DA VINCI (MISCELLANEOUS) ×2
DECANTER SPIKE VIAL GLASS SM (MISCELLANEOUS) ×3 IMPLANT
DRAIN CHANNEL 15F RND FF 3/16 (WOUND CARE) ×3 IMPLANT
DRAPE ARM DVNC X/XI (DISPOSABLE) ×4 IMPLANT
DRAPE COLUMN DVNC XI (DISPOSABLE) ×1 IMPLANT
DRAPE DA VINCI XI ARM (DISPOSABLE) ×8
DRAPE DA VINCI XI COLUMN (DISPOSABLE) ×2
DRAPE INCISE IOBAN 66X45 STRL (DRAPES) ×3 IMPLANT
DRAPE SHEET LG 3/4 BI-LAMINATE (DRAPES) ×3 IMPLANT
ELECT PENCIL ROCKER SW 15FT (MISCELLANEOUS) ×3 IMPLANT
ELECT REM PT RETURN 15FT ADLT (MISCELLANEOUS) ×3 IMPLANT
EVACUATOR SILICONE 100CC (DRAIN) ×3 IMPLANT
EXTRACTOR STONE NITINOL NGAGE (UROLOGICAL SUPPLIES) ×3 IMPLANT
FIBER LASER TRAC TIP (UROLOGICAL SUPPLIES) ×6 IMPLANT
GLOVE BIO SURGEON STRL SZ 6.5 (GLOVE) ×2 IMPLANT
GLOVE BIO SURGEONS STRL SZ 6.5 (GLOVE) ×1
GLOVE BIOGEL M STRL SZ7.5 (GLOVE) ×9 IMPLANT
GOWN STRL REUS W/TWL LRG LVL3 (GOWN DISPOSABLE) ×6 IMPLANT
GUIDEWIRE STR DUAL SENSOR (WIRE) ×3 IMPLANT
IRRIG SUCT STRYKERFLOW 2 WTIP (MISCELLANEOUS) ×3
IRRIGATION SUCT STRKRFLW 2 WTP (MISCELLANEOUS) ×1 IMPLANT
KIT BASIN OR (CUSTOM PROCEDURE TRAY) ×3 IMPLANT
NEEDLE INSUFFLATION 14GA 120MM (NEEDLE) ×3 IMPLANT
NS IRRIG 1000ML POUR BTL (IV SOLUTION) ×3 IMPLANT
PAD POSITIONING PINK XL (MISCELLANEOUS) ×3 IMPLANT
PORT ACCESS TROCAR AIRSEAL 12 (TROCAR) ×1 IMPLANT
PORT ACCESS TROCAR AIRSEAL 5M (TROCAR) ×2
POSITIONER SURGICAL ARM (MISCELLANEOUS) ×6 IMPLANT
SEAL CANN UNIV 5-8 DVNC XI (MISCELLANEOUS) ×4 IMPLANT
SEAL XI 5MM-8MM UNIVERSAL (MISCELLANEOUS) ×8
SET BI-LUMEN FLTR TB AIRSEAL (TUBING) IMPLANT
SET CYSTO W/LG BORE CLAMP LF (SET/KITS/TRAYS/PACK) ×3 IMPLANT
SET IRRIG Y TYPE TUR BLADDER L (SET/KITS/TRAYS/PACK) ×3 IMPLANT
SET TRI-LUMEN FLTR TB AIRSEAL (TUBING) ×15 IMPLANT
SOLUTION ELECTROLUBE (MISCELLANEOUS) ×3 IMPLANT
SPONGE LAP 4X18 RFD (DISPOSABLE) ×3 IMPLANT
STENT CONTOUR 6FRX26X.038 (STENTS) ×3 IMPLANT
SUT ETHILON 3 0 PS 1 (SUTURE) ×3 IMPLANT
SUT MON AB 4-0 SH 27 (SUTURE) ×6 IMPLANT
SUT VIC AB 0 CT1 27 (SUTURE) ×2
SUT VIC AB 0 CT1 27XBRD ANTBC (SUTURE) ×1 IMPLANT
SUT VIC AB 0 UR5 27 (SUTURE) IMPLANT
SUT VIC AB 4-0 RB1 27 (SUTURE) ×2
SUT VIC AB 4-0 RB1 27XBRD (SUTURE) ×1 IMPLANT
SUT VICRYL 0 UR6 27IN ABS (SUTURE) ×6 IMPLANT
TOWEL OR NON WOVEN STRL DISP B (DISPOSABLE) ×3 IMPLANT
TRAY FOLEY MTR SLVR 16FR STAT (SET/KITS/TRAYS/PACK) ×3 IMPLANT
TRAY LAPAROSCOPIC (CUSTOM PROCEDURE TRAY) ×3 IMPLANT
TROCAR BLADELESS OPT 5 100 (ENDOMECHANICALS) IMPLANT
WATER STERILE IRR 1000ML POUR (IV SOLUTION) ×3 IMPLANT

## 2018-08-13 NOTE — Anesthesia Postprocedure Evaluation (Signed)
Anesthesia Post Note  Patient: Phiona Ramnauth Roes  Procedure(s) Performed: XI ROBOTIC ASSISTED PYELOPLASTY WITH STENT PLACEMENT (Left ) FLEXIBLE URETEROSCOPY, Holmium laser, Basket extraction of left renal stone (Left )     Patient location during evaluation: PACU Anesthesia Type: General Level of consciousness: awake and alert Pain management: pain level controlled Vital Signs Assessment: post-procedure vital signs reviewed and stable Respiratory status: spontaneous breathing, nonlabored ventilation, respiratory function stable and patient connected to nasal cannula oxygen Cardiovascular status: blood pressure returned to baseline and stable Postop Assessment: no apparent nausea or vomiting Anesthetic complications: no    Last Vitals:  Vitals:   08/13/18 1415 08/13/18 1426  BP: 137/69 (!) 145/64  Pulse: 70 76  Resp: 15 16  Temp: 36.5 C (!) 36.3 C  SpO2: 94% 93%    Last Pain:  Vitals:   08/13/18 1426  TempSrc: Oral  PainSc:                  Rachel Samples,W. EDMOND

## 2018-08-13 NOTE — Anesthesia Procedure Notes (Signed)
Date/Time: 08/13/2018 1:03 PM Performed by: Cynda Familia, CRNA Oxygen Delivery Method: Simple face mask Placement Confirmation: positive ETCO2 and breath sounds checked- equal and bilateral Dental Injury: Teeth and Oropharynx as per pre-operative assessment

## 2018-08-13 NOTE — Op Note (Signed)
Preoperative diagnosis: left ureteropelvic junction obstruction  Postoperative diagnosis: left ureteropelvic junction obstruction  Procedure:  1. left ureteral stent placement  2. left robotic-assisted laparoscopic dismembered pyeloplasty 3. Left ureteroscopy, laser lithotripsy, stone removal  Surgeon: Louis Meckel,  M.D.  Assistant(s): Debbrah Alar, PA-C Resident Surgeon: Case Woods,MD  Anesthesia: General  Complications: None  EBL: 50 mL    Specimens: 1. Left kidney stones taken to [facility] for composition analysis  Intraoperative findings:  1. Crossing vessel causing UPJ obstruction - dismembered pyeloplasty 2. Large stones in lower pole required laser lithotripsy for removal.  Drains:  1. # 19 Blake perinephric drain 2. 16 Fr Foley catheter 3. Left 26x6cm ureteral stent  Indication: Miranda Hutchinson is a 72 y.o. patient with a suspected left ureteropelvic junction obstruction.  After a thorough review of the management options for their ureteropelvic junction obstruction, they elected to proceed with surgical treatment and the above procedure.  We have discussed the potential benefits and risks of the procedure, side effects of the proposed treatment, the likelihood of the patient achieving the goals of the procedure, and any potential problems that might occur during the procedure or recuperation. Informed consent has been obtained.  Description of procedure: An assistant was required for this surgical procedure.  The duties of the assistant included but were not limited to suctioning, passing suture, camera manipulation, retraction. This procedure would not be able to be performed without an Environmental consultant.   The patient was taken to the operating room and a general anesthetic was administered. The patient was given preoperative antibiotics, placed in left modified flank position and prepped and draped in the usual sterile fashion. Next a preoperative timeout was  performed.   A site was selected on the left side of the umbilicus for placement of the camera port. This was placed using a standard open Hassan technique which allowed entry into the peritoneal cavity under direct vision and without difficulty. A 8 mm port was placed and a pneumoperitoneum established. The camera was then used to inspect the abdomen and there was no evidence of any intra-abdominal injuries or other abnormalities. The remaining abdominal ports were then placed. 8 mm robotic ports were placed in the ipsilateral upper quadrant, lower quadrant, and far lateral abdominal wall. A 12 mm port was placed in the upper midline for laparoscopic assistance. All ports were placed under direct vision without difficulty. The surgical cart was then docked.   Utilizing the cautery scissors, the white line of Toldt was incised allowing the plane between the mesocolon and the anterior layer of Gerota's fascia to be developed and the kidney exposed.  The ureter and gonadal vein were identified inferiorly and the ureter was lifted anteriorly off the psoas muscle.  Dissection proceeded superiorly along the gonadal vein until the main renal hilum and renal pelvis was identified.  The ureteropelvic junction was isolated from the surrounding structures with a combination of sharp and blunt dissection.    The ureteropelvic junction was divided. The renal pelvis incision was extended inferiorly to allow an improved insertion for the ureter.  Flexible ureteroscopy then ensued.  Navigating the renal pelvis several stones were encountered in the left lower pole.  Using N-gage basket I attempted to remove the stones without fragmentation.  The infundibulum was to narrow and thus the stones required laser fragmentation prior to removal.  Once they had been fragmented they were easily removed.  The ureter and renal pelvis were then examined.  Any excess renal pelvis was  excised as needed and the renal pelvis and  ureter were then spatulated appropriately.  4-0 vicryl sutures were then used to reapproximate the renal pelvis and ureter with running sutures along the anterior and posterior wall.  With the anterior wall sutured and the start of the posterior wall we placed our stent. A sensor wire was passed down the ureter and then a 6x26 JJ stent was passed over the wire. At this point we filled the bladder with methylene blue and saline to 350 ml's and had good refulx of fluid into the renal pelvis with a good curl of the stent in the renal pelvis confirming location in the bladder. We then completed the posterior wall. The anastomosis was performed in a tension-free, watertight fashion with the ureter reconnected to the renal pelvis in a position to provide dependent drainage of the renal collecting system.   A # 15 Blake perinephric drain was then brought through the lateral lower abdominal port site and positioned appropriately.  It was secured to the skin with a nylon suture.  The 12 mm port sites were then closed with 0-vicryl sutures placed laparoscopically with the laparoscopic suture passer. All remaining ports were removed under direct vision after hemostasis was confirmed with the pneumoperiotneum let down. All port sites were injected with 0.25% bupivicaine and reapproximated at the skin level with 4-0 monocryl subcuticular sutures. Dermabond was applied to the skin.  The patient appeared to tolerate the procedure well and without complications.  The patient was able to be extubated and transferred to the recovery unit in satisfactory condition.

## 2018-08-13 NOTE — Discharge Instructions (Signed)

## 2018-08-13 NOTE — Transfer of Care (Signed)
Immediate Anesthesia Transfer of Care Note  Patient: Miranda Hutchinson  Procedure(s) Performed: XI ROBOTIC ASSISTED PYELOPLASTY WITH STENT PLACEMENT (Left ) FLEXIBLE URETEROSCOPY, Holmium laser, Basket extraction of left renal stone (Left )  Patient Location: PACU  Anesthesia Type:General  Level of Consciousness: sedated  Airway & Oxygen Therapy: Patient Spontanous Breathing and Patient connected to face mask oxygen  Post-op Assessment: Report given to RN and Post -op Vital signs reviewed and stable  Post vital signs: Reviewed  Last Vitals:  Vitals Value Taken Time  BP 135/64 08/13/2018  1:10 PM  Temp    Pulse 71 08/13/2018  1:15 PM  Resp 15 08/13/2018  1:15 PM  SpO2 96 % 08/13/2018  1:15 PM  Vitals shown include unvalidated device data.  Last Pain:  Vitals:   08/13/18 0608  TempSrc:   PainSc: 0-No pain      Patients Stated Pain Goal: 4 (48/18/59 0931)  Complications: No apparent anesthesia complications

## 2018-08-13 NOTE — H&P (Signed)
UPJ obstruction  HPI: Miranda Hutchinson is a 72 year-old female established patient who is here for further eval and management of UPJ obstruction.  The problem is on the left side. She has not had any treatment for her ureteropelvic junction obstruction.   She does not have a history of urinary infections. She has not had kidney stones. She is having flank pain. Her renal function is abnormal.   Her last radiologic test to evaluate the kidneys was 06/22/2018.   06/22/18 Renogram:  Left - 43%/ Right - 57%  T1/2 - not achieved on left, immediate excretion on right  Imp: Obstructive hydronephrosis for the left kidney at the ureteral pelvic junction; normal right kidney.   05/06/18 CT scan - No obvious crossing vessel on CT scan.  06/07/18 BUN/Cr - 16/1.30   h/o of CABG and Aortic Root replacement in 2013.   Intv: Patient presents today for follow-up after completing a renogram.     ALLERGIES: Aspirin Motrin Percocet    MEDICATIONS: Metoprolol Tartrate 25 mg tablet  Acetaminophen  Cyclosporine  Multiple Vitamins  Rosuvastatin Calcium 40 mg tablet     GU PSH: None     PSH Notes: open heart surgery    NON-GU PSH: Heart Surgery (Unspecified)    GU PMH: UPJ obstruction (congenital) - 06/14/2018, - 05/06/2018    NON-GU PMH: Cataract Hypertension    FAMILY HISTORY: 1 Daughter - No Family History Diabetes - Uncle   SOCIAL HISTORY: Marital Status: Widowed Preferred Language: English; Race: Black or African American Current Smoking Status: Patient has never smoked.   Tobacco Use Assessment Completed: Used Tobacco in last 30 days? Has never drank.  Drinks 1 caffeinated drink per day. Has not had a blood transfusion. Patient's occupation is/was retired.    REVIEW OF SYSTEMS:    GU Review Female:   Patient reports get up at night to urinate. Patient denies frequent urination, hard to postpone urination, burning /pain with urination, leakage of urine, stream starts and stops,  trouble starting your stream, have to strain to urinate, and being pregnant.  Gastrointestinal (Upper):   Patient denies nausea, vomiting, and indigestion/ heartburn.  Gastrointestinal (Lower):   Patient denies diarrhea and constipation.  Constitutional:   Patient denies fever, night sweats, weight loss, and fatigue.  Skin:   Patient denies skin rash/ lesion and itching.  Eyes:   Patient reports blurred vision. Patient denies double vision.  Ears/ Nose/ Throat:   Patient denies sore throat and sinus problems.  Hematologic/Lymphatic:   Patient reports easy bruising. Patient denies swollen glands.  Cardiovascular:   Patient denies leg swelling and chest pains.  Respiratory:   Patient denies cough and shortness of breath.  Endocrine:   Patient denies excessive thirst.  Musculoskeletal:   Patient denies back pain and joint pain.  Neurological:   Patient reports dizziness. Patient denies headaches.  Psychologic:   Patient denies depression and anxiety.   VITAL SIGNS:      07/05/2018 09:19 AM  Weight 191 lb / 86.64 kg  Height 66 in / 167.64 cm  BP 127/69 mmHg  Pulse 71 /min  Temperature 98.5 F / 36.9 C  BMI 30.8 kg/m   MULTI-SYSTEM PHYSICAL EXAMINATION:    Constitutional: Well-nourished. No physical deformities. Normally developed. Good grooming.  Respiratory: No labored breathing, no use of accessory muscles. Normal breath sounds.  Cardiovascular: Regular rate and rhythm. No murmur, no gallop. Normal temperature, normal extremity pulses, no swelling, no varicosities.  Gastrointestinal: No mass, no tenderness, no  rigidity, non obese abdomen. Insertion site of her left nephrostomy tube is clean/dry/intact.     PAST DATA REVIEWED:  Source Of History:  Patient  Records Review:   Previous Doctor Records, Previous Patient Records, POC Tool  X-Ray Review: C.T. Abdomen/Pelvis: Reviewed Films. Discussed With Patient.  Lasix Renogram: Reviewed Films. Discussed With Patient.     PROCEDURES:           Urinalysis w/Scope - 81001 Dipstick Dipstick Cont'd Micro  Specimen: Voided Bilirubin: Neg WBC/hpf: 20 - 40/hpf  Color: Yellow Ketones: Neg RBC/hpf: NS (Not Seen)  Appearance: Clear Blood: Neg Bacteria: Many (>50/hpf)  Specific Gravity: 1.025 Protein: Trace Cystals: NS (Not Seen)  pH: 5.0 Urobilinogen: 0.2 Casts: NS (Not Seen)  Glucose: Neg Nitrites: Neg Trichomonas: Not Present    Leukocyte Esterase: 2+ Mucous: Not Present      Epithelial Cells: 6 - 10/hpf      Yeast: NS (Not Seen)      Sperm: Not Present    ASSESSMENT:      ICD-10 Details  1 GU:   UPJ obstruction (congenital) - Q62.11    PLAN:           Orders Labs CULTURE, URINE          Schedule Return Visit/Planned Activity: ASAP - Schedule Surgery          Document Letter(s):  Created for Patient: Clinical Summary         Notes:   The patient has a UPJ section of uncertain etiology. She has reasonable function in the left kidney based on her most recent renogram. this also demonstrated a high-grade obstruction. I recommended that we proceed with robotic-assisted laparoscopic pyeloplasty to help resolve her issue. At that point, we can also remove her nephrostomy tube. I explained the surgery for her in detail. We discussed the surgery as well as the expected hospitalization. She understands that she will have a stent postoperatively, and we will remove the nephrostomy tube for the time of her operation. We discussed the risk of developing a urine leak as well as recurrence. We also discussed the risk inherent with any surgery including bleeding, infection, and other unknown or unexpected complications.   The patient has a significant cardiac history, but has recently done quite well. I have recommended that we get her primary care provider to provide preoperative clearance. We'll then get her scheduled for surgery shortly thereafter.

## 2018-08-13 NOTE — Anesthesia Procedure Notes (Signed)
Procedure Name: Intubation Date/Time: 08/13/2018 7:42 AM Performed by: Cynda Familia, CRNA Pre-anesthesia Checklist: Patient identified, Emergency Drugs available, Suction available and Patient being monitored Patient Re-evaluated:Patient Re-evaluated prior to induction Oxygen Delivery Method: Circle System Utilized Preoxygenation: Pre-oxygenation with 100% oxygen Induction Type: IV induction Ventilation: Mask ventilation without difficulty Laryngoscope Size: Miller and 2 Grade View: Grade I Tube type: Oral Number of attempts: 1 Airway Equipment and Method: Stylet Placement Confirmation: ETT inserted through vocal cords under direct vision,  positive ETCO2 and breath sounds checked- equal and bilateral Secured at: 22 cm Tube secured with: Tape Dental Injury: Teeth and Oropharynx as per pre-operative assessment  Comments: Smooth IV induction Fitzgerald-- intubation AM CRNA atraumatic-- teeth and mouth as preop-- many missing teeth - upper tooth present-- thin and chipped-- mouth and teeth unchanged after laryngoscopy-- bilat BS Ola Spurr

## 2018-08-13 NOTE — Postoperative (Signed)
The patient is s/p  robotic left sided dismembered pyeloplasty. We also did ureteroscopy and laser lithotripsy. She has a left sided ureteral stent in [place], a Foley catheter, and a JP drain in the left lower quadrant. EBL was approximately 50 mL. There were several liters of intra abdominal irrigation from the ureteroscopy. I would expect high drain output for the next 24 hours. She was stable throughout the procedure. There were no  immediate postoperative complications.   The full operative report is pending.

## 2018-08-14 ENCOUNTER — Other Ambulatory Visit: Payer: Self-pay

## 2018-08-14 ENCOUNTER — Encounter (HOSPITAL_COMMUNITY): Payer: Self-pay | Admitting: Urology

## 2018-08-14 DIAGNOSIS — Q6239 Other obstructive defects of renal pelvis and ureter: Secondary | ICD-10-CM | POA: Diagnosis not present

## 2018-08-14 LAB — BASIC METABOLIC PANEL
Anion gap: 7 (ref 5–15)
BUN: 23 mg/dL (ref 8–23)
CHLORIDE: 108 mmol/L (ref 98–111)
CO2: 24 mmol/L (ref 22–32)
CREATININE: 1.42 mg/dL — AB (ref 0.44–1.00)
Calcium: 8.5 mg/dL — ABNORMAL LOW (ref 8.9–10.3)
GFR, EST AFRICAN AMERICAN: 41 mL/min — AB (ref >60)
GFR, EST NON AFRICAN AMERICAN: 35 mL/min — AB (ref >60)
Glucose, Bld: 142 mg/dL — ABNORMAL HIGH (ref 70–99)
POTASSIUM: 4.8 mmol/L (ref 3.5–5.1)
SODIUM: 139 mmol/L (ref 135–145)

## 2018-08-14 LAB — HEMOGLOBIN AND HEMATOCRIT, BLOOD
HEMATOCRIT: 33.5 % — AB (ref 36.0–46.0)
HEMOGLOBIN: 10.6 g/dL — AB (ref 12.0–15.0)

## 2018-08-14 LAB — CREATININE, FLUID (PLEURAL, PERITONEAL, JP DRAINAGE): Creat, Fluid: 1.2 mg/dL

## 2018-08-14 MED ORDER — TRAMADOL HCL 50 MG PO TABS: 50.0000 mg | ORAL_TABLET | Freq: Four times a day (QID) | ORAL | 0 refills | Status: DC | PRN

## 2018-08-14 MED ORDER — CIPROFLOXACIN HCL 250 MG PO TABS: 250.0000 mg | ORAL_TABLET | Freq: Every day | ORAL | 0 refills | Status: AC

## 2018-08-14 NOTE — Discharge Summary (Signed)
[facility] Discharge Summary  Admit date: 08/13/2018  Discharge date and time: 08/14/18   Discharge to: Home  Discharge Service: Urology  Discharge Attending Physician: Alexis Frock, MD  Discharge  Diagnoses: UPJ obstruction  Secondary Diagnosis: Active Problems:   UPJ obstruction, congenital   OR Procedures: Procedure(s): XI ROBOTIC ASSISTED PYELOPLASTY WITH STENT PLACEMENT FLEXIBLE URETEROSCOPY, Holmium laser, Basket extraction of left renal stone 08/13/2018   Ancillary Procedures: None   Discharge Day Services: The patient was seen and examined by the Urology team both in the morning and immediately prior to discharge.  Vital signs and laboratory values were stable and within normal limits.  The physical exam was benign and unchanged and all surgical wounds were examined.  Discharge instructions were explained and all questions answered.  Subjective  No acute events overnight. Pain Controlled. No fever or chills.  Objective Patient Vitals for the past 8 hrs:  BP Temp Temp src Pulse Resp SpO2  08/14/18 1338 (!) 120/49 98.4 F (36.9 C) Oral 65 20 97 %   Total I/O In: 620 [P.O.:120; I.V.:500] Out: -   General Appearance:        No acute distress Lungs:                      Normal work of breathing on room air Heart:                                Regular rate and rhythm Abdomen:                         Soft, non-tender, non-distended Extremities:                      Warm and well perfused   Hospital Course:  The patient underwent left robotic pyeloplasty and laser lithotripsy on 08/13/2018.  The patient tolerated the procedure well, was extubated in the OR, and afterwards was taken to the PACU for routine post-surgical care. When stable the patient was transferred to the floor.   The patient did well postoperatively.  The patient's diet was slowly advanced and at the time of discharge was tolerating a regular diet. Her JP fluid was sent for Cr and returned same  as serum. Her JP drain was later removed. The patient was discharged home 1 Day Post-Op, at which point was tolerating a regular solid diet, was able to void spontaneously, have adequate pain control with P.O. pain medication, and could ambulate without difficulty. The patient will follow up with [place] for post op check and stent removal.  Condition at Discharge: Improved  Discharge Medications:  Allergies as of 08/14/2018      Reactions   Percocet [oxycodone-acetaminophen] Other (See Comments)   Hallucinations    Asa [aspirin] Nausea Only, Other (See Comments)   "hurts my stomach"      Medication List    STOP taking these medications   ALIVE WOMENS 50+ PO     TAKE these medications   acetaminophen 325 MG tablet Commonly known as:  TYLENOL Take 2 tablets (650 mg total) by mouth every 6 (six) hours as needed for fever (temp >101).   ciprofloxacin 250 MG tablet Commonly known as:  CIPRO Take 1 tablet (250 mg total) by mouth daily for 3 days. Start the day prior to stent removal.   cycloSPORINE 0.05 % ophthalmic emulsion Commonly known  as:  RESTASIS Place 1 drop into both eyes 2 (two) times daily.   metoprolol tartrate 25 MG tablet Commonly known as:  LOPRESSOR Take 25 mg by mouth 2 (two) times daily.   rosuvastatin 40 MG tablet Commonly known as:  CRESTOR TAKE 1 TABLET(40 MG) BY MOUTH DAILY   traMADol 50 MG tablet Commonly known as:  ULTRAM Take 1 tablet (50 mg total) by mouth every 6 (six) hours as needed.

## 2018-08-16 DIAGNOSIS — Q6211 Congenital occlusion of ureteropelvic junction: Secondary | ICD-10-CM | POA: Diagnosis not present

## 2018-08-17 LAB — BPAM RBC
BLOOD PRODUCT EXPIRATION DATE: 201909042359
Blood Product Expiration Date: 201908232359
UNIT TYPE AND RH: 600
Unit Type and Rh: 600

## 2018-08-17 LAB — TYPE AND SCREEN
ABO/RH(D): A NEG
Antibody Screen: POSITIVE
DONOR AG TYPE: NEGATIVE
Donor AG Type: NEGATIVE
UNIT DIVISION: 0
Unit division: 0

## 2018-08-23 DIAGNOSIS — R3915 Urgency of urination: Secondary | ICD-10-CM | POA: Diagnosis not present

## 2018-08-23 DIAGNOSIS — Q6211 Congenital occlusion of ureteropelvic junction: Secondary | ICD-10-CM | POA: Diagnosis not present

## 2018-09-03 DIAGNOSIS — R8271 Bacteriuria: Secondary | ICD-10-CM | POA: Diagnosis not present

## 2018-09-03 DIAGNOSIS — Q6211 Congenital occlusion of ureteropelvic junction: Secondary | ICD-10-CM | POA: Diagnosis not present

## 2018-09-13 DIAGNOSIS — R8271 Bacteriuria: Secondary | ICD-10-CM | POA: Diagnosis not present

## 2018-09-13 DIAGNOSIS — Q6211 Congenital occlusion of ureteropelvic junction: Secondary | ICD-10-CM | POA: Diagnosis not present

## 2018-10-08 ENCOUNTER — Other Ambulatory Visit: Payer: Self-pay | Admitting: Endocrinology

## 2018-10-08 DIAGNOSIS — E010 Iodine-deficiency related diffuse (endemic) goiter: Secondary | ICD-10-CM

## 2018-10-14 DIAGNOSIS — D649 Anemia, unspecified: Secondary | ICD-10-CM | POA: Diagnosis not present

## 2018-10-14 DIAGNOSIS — I10 Essential (primary) hypertension: Secondary | ICD-10-CM | POA: Diagnosis not present

## 2018-10-14 DIAGNOSIS — M25511 Pain in right shoulder: Secondary | ICD-10-CM | POA: Diagnosis not present

## 2018-10-14 DIAGNOSIS — Z23 Encounter for immunization: Secondary | ICD-10-CM | POA: Diagnosis not present

## 2018-10-14 DIAGNOSIS — I2510 Atherosclerotic heart disease of native coronary artery without angina pectoris: Secondary | ICD-10-CM | POA: Diagnosis not present

## 2018-10-14 DIAGNOSIS — E7800 Pure hypercholesterolemia, unspecified: Secondary | ICD-10-CM | POA: Diagnosis not present

## 2018-10-14 DIAGNOSIS — E041 Nontoxic single thyroid nodule: Secondary | ICD-10-CM | POA: Diagnosis not present

## 2018-10-14 DIAGNOSIS — Z1211 Encounter for screening for malignant neoplasm of colon: Secondary | ICD-10-CM | POA: Diagnosis not present

## 2018-10-14 DIAGNOSIS — Z8619 Personal history of other infectious and parasitic diseases: Secondary | ICD-10-CM | POA: Diagnosis not present

## 2018-10-22 DIAGNOSIS — M67911 Unspecified disorder of synovium and tendon, right shoulder: Secondary | ICD-10-CM | POA: Diagnosis not present

## 2018-10-22 DIAGNOSIS — M24811 Other specific joint derangements of right shoulder, not elsewhere classified: Secondary | ICD-10-CM | POA: Diagnosis not present

## 2018-10-26 ENCOUNTER — Other Ambulatory Visit: Payer: PPO

## 2018-10-27 DIAGNOSIS — E010 Iodine-deficiency related diffuse (endemic) goiter: Secondary | ICD-10-CM | POA: Diagnosis not present

## 2018-10-27 DIAGNOSIS — M7541 Impingement syndrome of right shoulder: Secondary | ICD-10-CM | POA: Diagnosis not present

## 2018-11-02 DIAGNOSIS — M7541 Impingement syndrome of right shoulder: Secondary | ICD-10-CM | POA: Diagnosis not present

## 2018-11-02 DIAGNOSIS — E010 Iodine-deficiency related diffuse (endemic) goiter: Secondary | ICD-10-CM | POA: Diagnosis not present

## 2018-11-04 DIAGNOSIS — M7541 Impingement syndrome of right shoulder: Secondary | ICD-10-CM | POA: Diagnosis not present

## 2018-11-08 DIAGNOSIS — Q6211 Congenital occlusion of ureteropelvic junction: Secondary | ICD-10-CM | POA: Diagnosis not present

## 2018-11-10 ENCOUNTER — Ambulatory Visit
Admission: RE | Admit: 2018-11-10 | Discharge: 2018-11-10 | Disposition: A | Discharge status: Home / Self Care | Payer: PPO | Source: Ambulatory Visit | Attending: Endocrinology | Admitting: Endocrinology

## 2018-11-10 DIAGNOSIS — E042 Nontoxic multinodular goiter: Secondary | ICD-10-CM | POA: Diagnosis not present

## 2018-11-10 DIAGNOSIS — E010 Iodine-deficiency related diffuse (endemic) goiter: Secondary | ICD-10-CM

## 2018-11-11 DIAGNOSIS — M7541 Impingement syndrome of right shoulder: Secondary | ICD-10-CM | POA: Diagnosis not present

## 2019-04-01 IMAGING — US IR NEPHROSTOMY PLACEMENT LEFT
1 series · 2 of 2 positions shown · non-contrast
Comparison: None.

INDICATION: 72-year-old female with a history of sepsis and left ureteral
obstruction.

EXAM:
IR NEPHROSTOMY PLACEMENT LEFT

[Series 1: ir nephrostomy placement left · 2 of 2 slices shown]
[im 1/2]
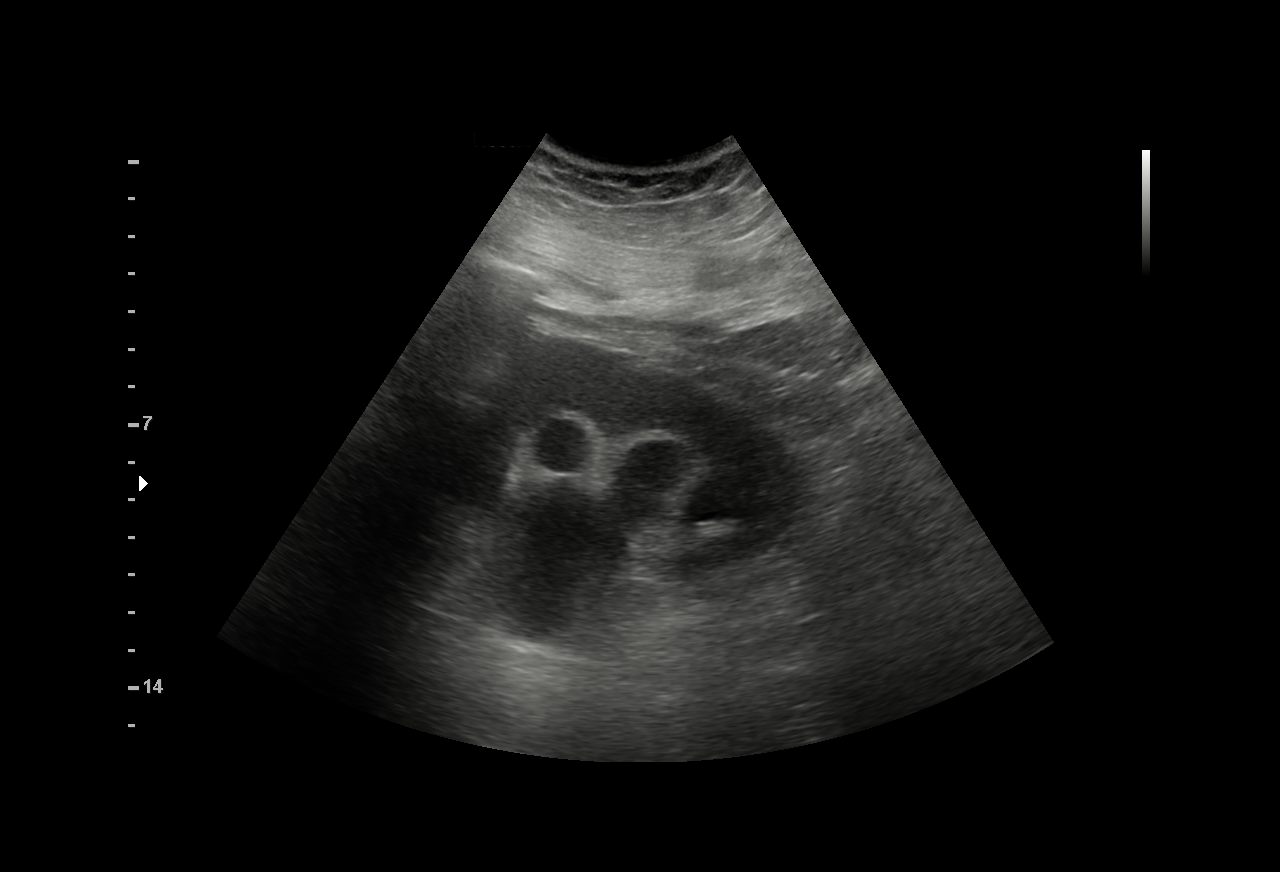
[im 2/2]
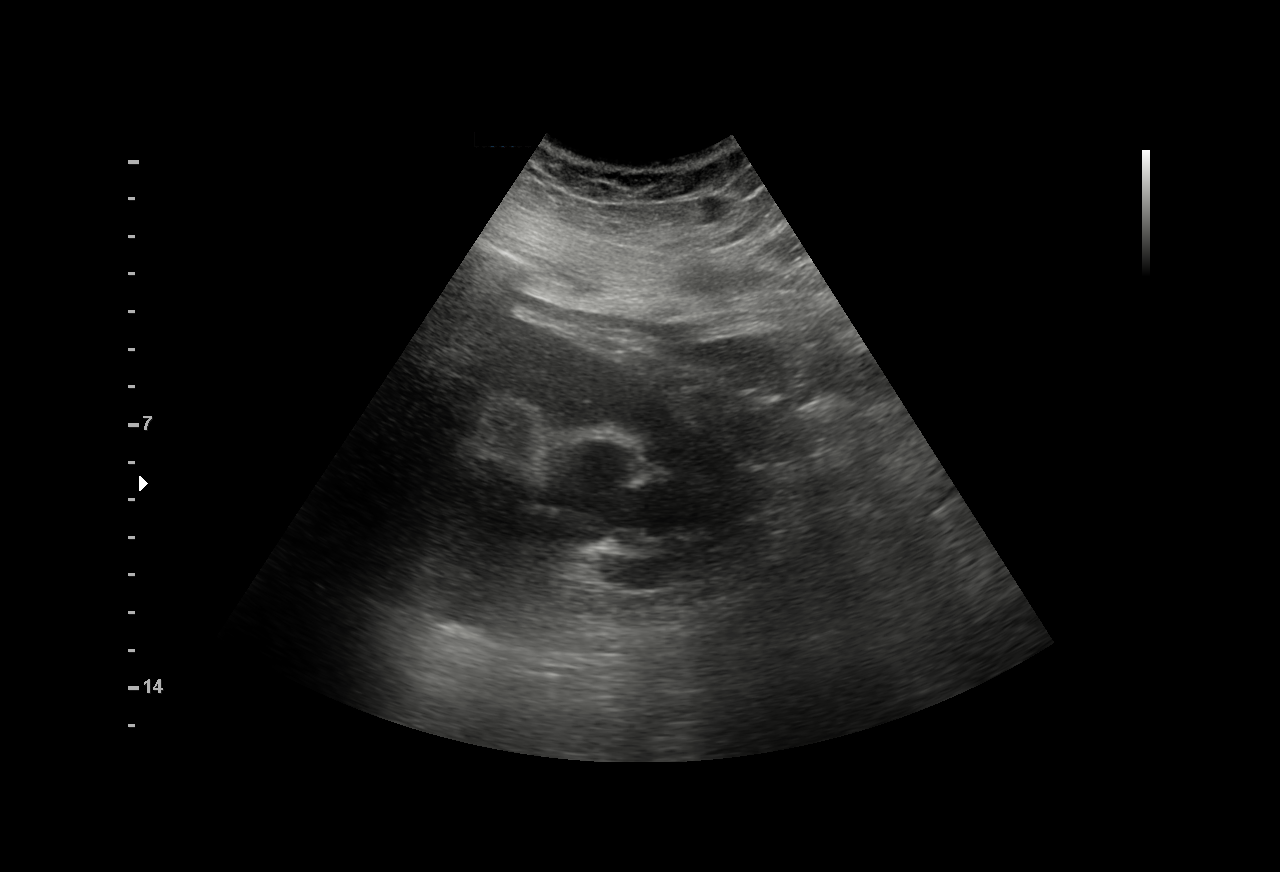

[2 of 2 positions shown; findings below may reference images not displayed]

MEDICATIONS:
None

ANESTHESIA/SEDATION:
Fentanyl 100 mcg IV; Versed 2.0 mg IV

Moderate Sedation Time:  14 minutes

The patient was continuously monitored during the procedure by the
interventional radiology nurse under my direct supervision.

CONTRAST:  10 cc-administered into the collecting system(s)

FLUOROSCOPY TIME:  Fluoroscopy Time: 0 minutes 36 seconds (3.8 mGy).

COMPLICATIONS:
None immediate.

PROCEDURE:
Informed written consent was obtained from the patient after a
thorough discussion of the procedural risks, benefits and
alternatives. All questions were addressed. Maximal Sterile Barrier
Technique was utilized including caps, mask, sterile gowns, sterile
gloves, sterile drape, hand hygiene and skin antiseptic. A timeout
was performed prior to the initiation of the procedure.

Patient positioned prone position on the fluoroscopy table.
Ultrasound survey of the left flank was performed with images stored
and sent to PACs.

The patient was then prepped and draped in the usual sterile
fashion. 1% lidocaine was used to anesthetize the skin and
subcutaneous tissues for local anesthesia.

A Chiba needle was then used to access a posterior inferior calyx
with ultrasound guidance. With spontaneous urine returned through
the needle, passage of an 018 micro wire into the collecting system
was performed under fluoroscopy.

A small incision was made with an 11 blade scalpel, and the needle
was removed from the wire.

An Accustick system was then advanced over the wire into the
collecting system under fluoroscopy. The metal stiffener and inner
dilator were removed, and then a sample of fluid was aspirated
through the 4 French outer sheath.

Bentson wire was passed into the collecting system and the sheath
removed. Ten French dilation of the soft tissues was performed.

Using modified Seldinger technique, a 10 French pigtail catheter
drain was placed over the Bentson wire.

Wire and inner stiffener removed, and the pigtail was formed in the
collecting system.

Small amount of contrast confirmed position of the catheter.

Patient tolerated the procedure well and remained hemodynamically
stable throughout.

No complications were encountered and no significant blood loss
encountered
IMPRESSION: Status post left percutaneous nephrostomy.

## 2019-05-26 DIAGNOSIS — Z1211 Encounter for screening for malignant neoplasm of colon: Secondary | ICD-10-CM | POA: Diagnosis not present

## 2019-05-26 DIAGNOSIS — D649 Anemia, unspecified: Secondary | ICD-10-CM | POA: Diagnosis not present

## 2019-05-26 DIAGNOSIS — N3281 Overactive bladder: Secondary | ICD-10-CM | POA: Diagnosis not present

## 2019-05-26 DIAGNOSIS — Z0000 Encounter for general adult medical examination without abnormal findings: Secondary | ICD-10-CM | POA: Diagnosis not present

## 2019-05-26 DIAGNOSIS — I2510 Atherosclerotic heart disease of native coronary artery without angina pectoris: Secondary | ICD-10-CM | POA: Diagnosis not present

## 2019-05-26 DIAGNOSIS — Z1389 Encounter for screening for other disorder: Secondary | ICD-10-CM | POA: Diagnosis not present

## 2019-05-26 DIAGNOSIS — Z8619 Personal history of other infectious and parasitic diseases: Secondary | ICD-10-CM | POA: Diagnosis not present

## 2019-05-26 DIAGNOSIS — R8290 Unspecified abnormal findings in urine: Secondary | ICD-10-CM | POA: Diagnosis not present

## 2019-05-26 DIAGNOSIS — I10 Essential (primary) hypertension: Secondary | ICD-10-CM | POA: Diagnosis not present

## 2019-05-26 DIAGNOSIS — E7800 Pure hypercholesterolemia, unspecified: Secondary | ICD-10-CM | POA: Diagnosis not present

## 2019-06-02 ENCOUNTER — Other Ambulatory Visit: Payer: Self-pay | Admitting: Physician Assistant

## 2019-06-13 DIAGNOSIS — E7800 Pure hypercholesterolemia, unspecified: Secondary | ICD-10-CM | POA: Diagnosis not present

## 2019-06-13 DIAGNOSIS — D649 Anemia, unspecified: Secondary | ICD-10-CM | POA: Diagnosis not present

## 2019-06-14 ENCOUNTER — Telehealth: Payer: Self-pay | Admitting: *Deleted

## 2019-06-14 NOTE — Telephone Encounter (Signed)
A message was left, re: follow up visit. 

## 2019-06-21 DIAGNOSIS — R3915 Urgency of urination: Secondary | ICD-10-CM | POA: Diagnosis not present

## 2019-06-21 DIAGNOSIS — Q6211 Congenital occlusion of ureteropelvic junction: Secondary | ICD-10-CM | POA: Diagnosis not present

## 2019-06-21 DIAGNOSIS — R8271 Bacteriuria: Secondary | ICD-10-CM | POA: Diagnosis not present

## 2019-06-21 DIAGNOSIS — R1915 Other abnormal bowel sounds: Secondary | ICD-10-CM | POA: Diagnosis not present

## 2019-06-21 DIAGNOSIS — R350 Frequency of micturition: Secondary | ICD-10-CM | POA: Diagnosis not present

## 2019-06-21 DIAGNOSIS — N3941 Urge incontinence: Secondary | ICD-10-CM | POA: Diagnosis not present

## 2019-06-29 DIAGNOSIS — R05 Cough: Secondary | ICD-10-CM | POA: Diagnosis not present

## 2019-06-29 DIAGNOSIS — R630 Anorexia: Secondary | ICD-10-CM | POA: Diagnosis not present

## 2019-06-30 DIAGNOSIS — R05 Cough: Secondary | ICD-10-CM | POA: Diagnosis not present

## 2019-07-07 IMAGING — DX DG ABDOMEN 1V
1 series · 1 of 1 positions shown · non-contrast
Comparison: CT abdomen and pelvis June 03, 2018

CLINICAL DATA: Abdominal pain

EXAM:
ABDOMEN - 1 VIEW

[abdomen kub]
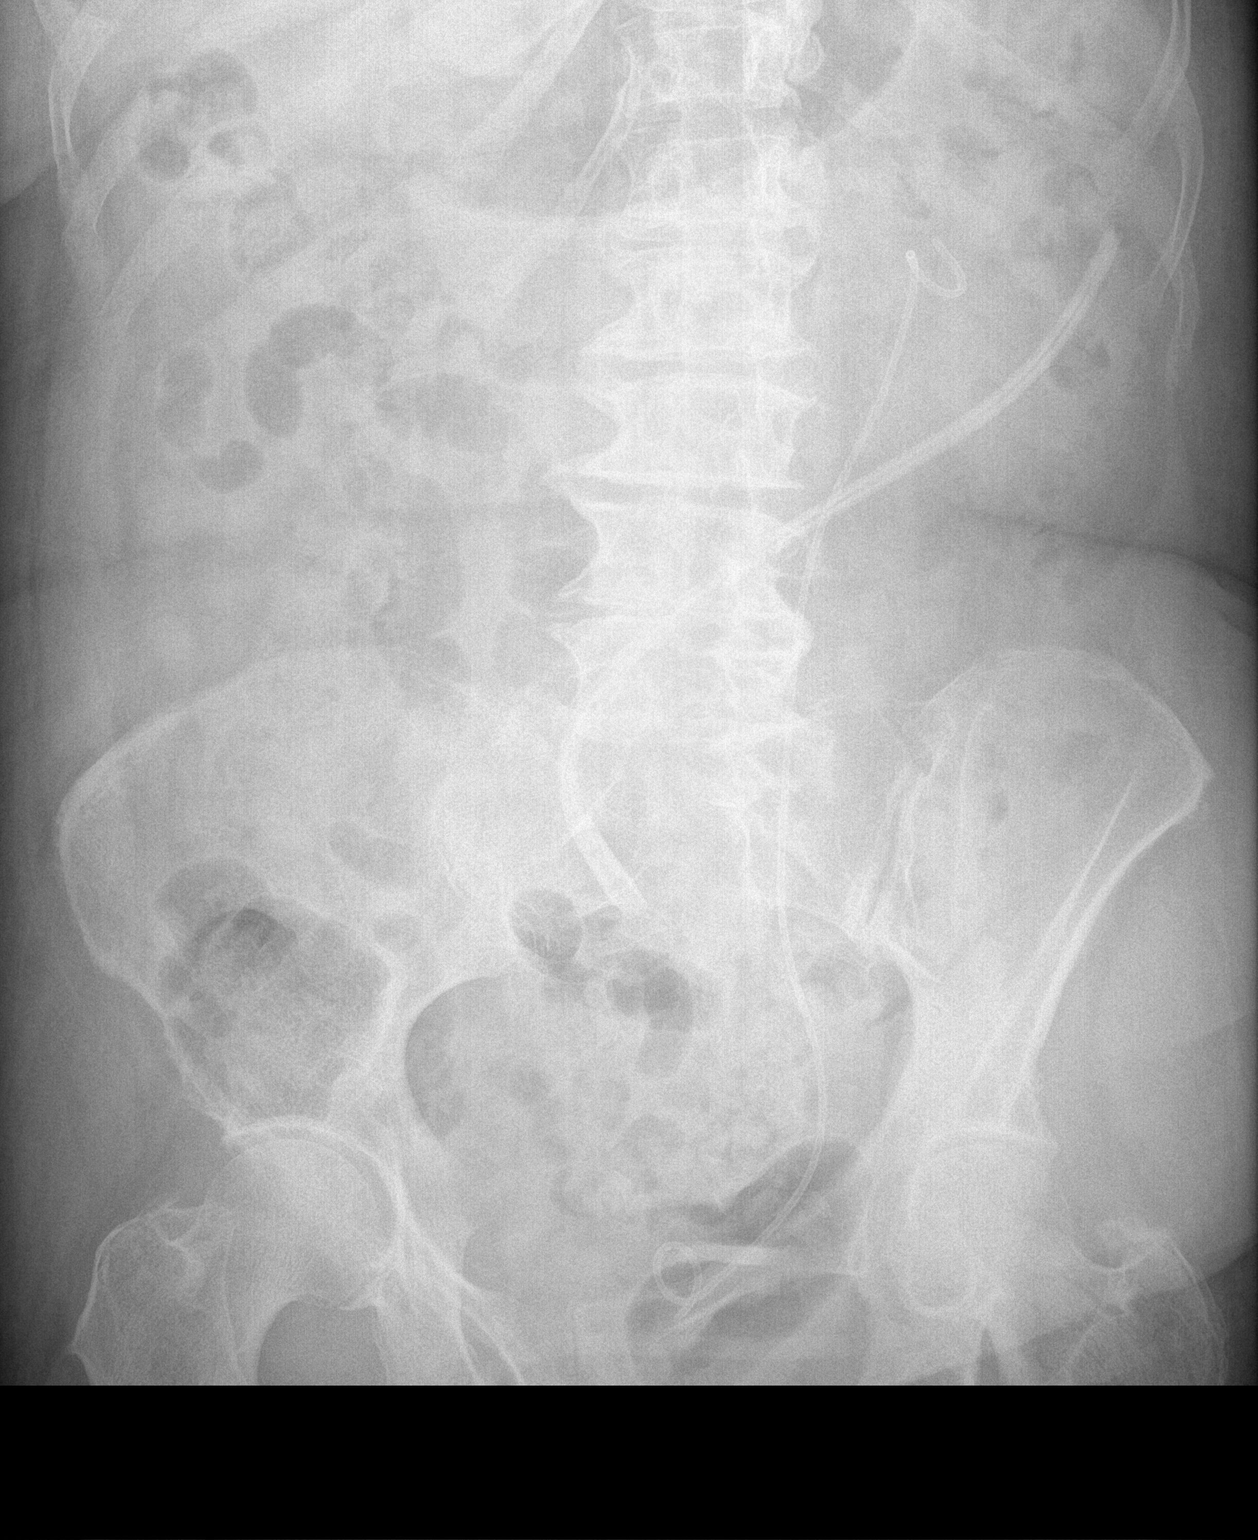

[1 of 1 positions shown; findings below may reference images not displayed]

FINDINGS: Double-J stent extends from the upper pole left kidney to the
bladder. There is a drain in the left upper abdomen extending to the
right medially with the tip in the upper pelvic region.

There is moderate stool in the colon. No appreciable bowel
dilatation or air-fluid level to suggest bowel obstruction. No free
air.
IMPRESSION: Double-J stent on the left extending from upper pole left kidney to
bladder. Drain present primarily on the left. No bowel obstruction
or free air. Moderate stool in colon.

## 2019-07-18 NOTE — Progress Notes (Signed)
Error

## 2019-07-19 ENCOUNTER — Telehealth: Payer: Self-pay

## 2019-07-19 NOTE — Telephone Encounter (Signed)
I left a message for pt to call back and give consent for Virtual Visit on 07-20-19.

## 2019-07-19 NOTE — Telephone Encounter (Signed)
7-22 10AM APPT [facility]  PT TESTED +COVID WILL HAVE TELEPHONE VISIT

## 2019-07-19 NOTE — Telephone Encounter (Signed)
TRIED TO CALL FOR COVID QUEST FOR 7-22APPT PHONE JUST KEEPS RINGING

## 2019-07-20 ENCOUNTER — Telehealth (INDEPENDENT_AMBULATORY_CARE_PROVIDER_SITE_OTHER): Payer: PPO | Admitting: Cardiology

## 2019-07-20 NOTE — Telephone Encounter (Signed)
PT DOES NOT FEEL WELL TODAY, RESCHEEDULED

## 2019-07-27 NOTE — Progress Notes (Signed)
Virtual Visit via Telephone Note   This visit type was conducted due to national recommendations for restrictions regarding the COVID-19 Pandemic (e.g. social distancing) in an effort to limit this patient's exposure and mitigate transmission in our community.  Due to her co-morbid illnesses, this patient is at least at moderate risk for complications without adequate follow up.  This format is felt to be most appropriate for this patient at this time.  The patient did not have access to video technology/had technical difficulties with video requiring transitioning to audio format only (telephone).  All issues noted in this document were discussed and addressed.  No physical exam could be performed with this format.  Please refer to the patient's chart for her  consent to telehealth for [facility].   Date:  07/28/2019   ID:  Miranda Hutchinson, DOB [birth year], MRN [id number]  Patient Location: Home Provider Location: Office  PCP:  Antony Contras, MD  Cardiologist:  Minus Breeding, MD  Electrophysiologist:  None   Evaluation Performed:  Follow-Up Visit  Chief Complaint:  CAD   History of Present Illness:    Miranda Hutchinson is a 73 y.o. female with bilateral carotid artery disease, CAD, aortic valve insufficiency with dilated aortic root s/p CABG x 3 (LIMA to LAD, SVG to OM, SVG to PDA), AVR and ascending aortic replacement in 07/2012, and CKD stage III. Echocardiogram in 2018 with preserved EF of 55-60%. She had 2 occurrences of sepsis 2/ hydronephrosis in 2019 and underwent left ureteral stent placement on 08/13/18.  She last saw Dr. Percival Spanish 07/23/18 in clinic for follow up and preop evaluation. Because she could not complete 4.0 METS and because she had elevated troponin while hospitalized with sepsis, she underwent nuclear stress test which was negative for reversible ischemia (07/29/18). She was then cleared for surgery.  She presents today for telehealth visit for follow up. She recently  tested positive for COVID 19 - beginning of July. She is feeling fine. She had cough, but no other symptoms. She also had a UTI at the time. She quarantined for 2 weeks. PCP extended quarantine for 4 more days until cough was completely gone. She has had no further symptoms. She never had a fever.   She tolerated surgery in 2019. Nephrostomy tube was removed in Sept. No recent hospitalizations. No chest pain, SOB, orthopnea, and lower extremity edema. She does report sleepiness first thing in the morning for the past 1.5 weeks. Fatigue is only some mornings. Fatigue resolves generally in the morning and she is fine for the rest of the day. She denies snoring. She does report that she drinks during the day to "flush her kidneys" which makes her need to urinate during the night. This is likely causing her morning fatigue. She is not very active during the pandemic. We discussed walking programs while also avoiding summer heat.   Overall she is doing quite well. Her cholesterol panel 06/13/19 with LDL 119 and triglycerides 110.  She will check her lipids with her PCP in November.   The patient does not have symptoms concerning for COVID-19 infection (fever, chills, cough, or new shortness of breath).    Past Medical History:  Diagnosis Date  . Aortic insufficiency    a. ascending aortic root aneursym with AI s/p Bentall procedure with bioprosthetic aortic valve 07/2012.  . Ascending aortic aneurysm ([place])    a. s/p Bentall 2013.  . Aspirin intolerance   . Bilateral carotid artery disease ([place])  a.  carotid duplex 2013 showed 40-59% distal RICA and 22-97% mLICA stenosis. b. Duplex 09/2017 - <40% BICA.  Marland Kitchen Coronary artery disease    a. 3 vessel ASCAD s/p CABG (LIMA to LAD, SVG to OM, SVG to PDA) at time of Bentall 07/2012.  Marland Kitchen Heart murmur   . Hyperlipidemia   . Hypothyroidism   . RBBB   . Renal insufficiency    a. Cr 1.31 in 2016, previously 1 range.   Past Surgical History:  Procedure  Laterality Date  . ABDOMINAL HYSTERECTOMY  1990's  . AORTIC VALVE REPLACEMENT  08/10/2012   Procedure: AORTIC VALVE REPLACEMENT (AVR);  Surgeon: Grace Isaac, MD;  Location: [place];  Service: Open Heart Surgery;  Laterality: N/A;  . BREAST EXCISIONAL BIOPSY Left   . CARDIAC CATHETERIZATION  08/04/2012  . CORONARY ARTERY BYPASS GRAFT  08/10/2012   Procedure: CORONARY ARTERY BYPASS GRAFTING (CABG);  Surgeon: Grace Isaac, MD;  Location: [place];  Service: Open Heart Surgery;  Laterality: N/A;  . FLEXIBLE URETEROSCOPY Left 08/13/2018   Procedure: FLEXIBLE URETEROSCOPY, Holmium laser, Basket extraction of left renal stone;  Surgeon: Ardis Hughs, MD;  Location: [facility];  Service: Urology;  Laterality: Left;  . IR NEPHROSTOMY EXCHANGE LEFT  06/07/2018  . IR NEPHROSTOMY PLACEMENT LEFT  05/08/2018  . ROBOT ASSISTED PYELOPLASTY Left 08/13/2018   Procedure: XI ROBOTIC ASSISTED PYELOPLASTY WITH STENT PLACEMENT;  Surgeon: Ardis Hughs, MD;  Location[facility];  Service: Urology;  Laterality: Left;  . THORACIC AORTIC ANEURYSM REPAIR  08/10/2012   Procedure: THORACIC ASCENDING ANEURYSM REPAIR (AAA);  Surgeon: Grace Isaac, MD;  Location: [place];  Service: Open Heart Surgery;  Laterality: N/A;  . TONSILLECTOMY     "I was a little child"     Current Meds  Medication Sig  . acetaminophen (TYLENOL) 325 MG tablet Take 2 tablets (650 mg total) by mouth every 6 (six) hours as needed for fever (temp >101).  . cycloSPORINE (RESTASIS) 0.05 % ophthalmic emulsion Place 1 drop into both eyes 2 (two) times daily.   . metoprolol tartrate (LOPRESSOR) 25 MG tablet Take 25 mg by mouth 2 (two) times daily.  . rosuvastatin (CRESTOR) 40 MG tablet Take 1 tablet (40 mg total) by mouth daily. Pt needs to call and make appt for further refills  . [DISCONTINUED] traMADol (ULTRAM) 50 MG tablet Take 1 tablet (50 mg total) by mouth every 6 (six) hours as needed.     Allergies:   Percocet [oxycodone-acetaminophen]  and Asa [aspirin]   Social History   Tobacco Use  . Smoking status: Former Smoker    Packs/day: 0.05    Years: 2.00    Pack years: 0.10    Types: Cigarettes    Quit date: 12/30/1983    Years since quitting: 35.6  . Smokeless tobacco: Never Used  Substance Use Topics  . Alcohol use: No  . Drug use: No     Family Hx: The patient's family history includes Lung cancer in her mother; Thyroid disease in her mother.  ROS:   Please see the history of present illness.     All other systems reviewed and are negative.   Prior CV studies:   The following studies were reviewed today:  Myoview 07/29/18:  The left ventricular ejection fraction is normal (55-65%).  Nuclear stress EF: 55%.  There was no ST segment deviation noted during stress.  Defect 1: There is a small defect of mild severity present in the  apical anterior location. Likely attenuation artifact.  This is a low risk study. No ischemia.   Echo 09/29/17: Study Conclusions  - Left ventricle: The cavity size was normal. Wall thickness was   increased in a pattern of mild LVH. Systolic function was normal.   The estimated ejection fraction was in the range of 55% to 60%.   Wall motion was normal; there were no regional wall motion   abnormalities. Doppler parameters are consistent with abnormal   left ventricular relaxation (grade 1 diastolic dysfunction). - Aortic valve: A bioprosthesis was present.  Impressions:  - Normal LV systolic function; mild LVH; mild diastolic   dysfunction; s/p AVR (not well visualized with normal maen   gradient of 16 mmHg); no AI.  Labs/Other Tests and Data Reviewed:    EKG:  An ECG dated 07/23/18 was personally reviewed today and demonstrated:  sinus rhythm with HR 76 with RBBB (old)  Recent Labs: 08/06/2018: ALT 15; Platelets 290 08/14/2018: BUN 23; Creatinine, Ser 1.42; Hemoglobin 10.6; Potassium 4.8; Sodium 139   Recent Lipid Panel Lab Results  Component Value Date/Time    CHOL 176 10/06/2017 02:00 PM   TRIG 166 (H) 10/06/2017 02:00 PM   HDL 40 10/06/2017 02:00 PM   CHOLHDL 4.4 10/06/2017 02:00 PM   CHOLHDL 4.6 10/16/2015 09:58 AM   LDLCALC 103 (H) 10/06/2017 02:00 PM    Wt Readings from Last 3 Encounters:  07/28/19 191 lb (86.6 kg)  08/13/18 191 lb (86.6 kg)  08/06/18 195 lb 6 oz (88.6 kg)     Objective:    Vital Signs:  BP 133/69   Ht 5\' 6"  (1.676 m)   Wt 191 lb (86.6 kg)   BMI 30.83 kg/m    VITAL SIGNS:  reviewed GEN:  no acute distress RESPIRATORY:  normal respiratory effort, symmetric expansion NEURO:  alert and oriented x 3, no obvious focal deficit PSYCH:  normal affect  ASSESSMENT & PLAN:    CAD s/p CABG  S/P AVR and ascending aorta repair RBBB Stable. No anginal symptoms.    Morning fatigue Likely related to needing to urinate during the night.   Hyperlipidemia On crestor 40 mg. Lipids 06/13/19:  Total cholesterol: 185 Triglycerides: 110 HDL: 44 LDL: 119 Will add zetia while she works on diet and exercise. She will check her lipids again in Nov of this year with PCP.   Hypertension Pressures well controlled on current regimen of lopressor 25mg  BID.   COVID-19 Education: The signs and symptoms of COVID-19 were discussed with the patient and how to seek care for testing (follow up with PCP or arrange E-visit).  The importance of social distancing was discussed today.  Time:   Today, I have spent 26 minutes with the patient with telehealth technology discussing the above problems.     Medication Adjustments/Labs and Tests Ordered: Current medicines are reviewed at length with the patient today.  Concerns regarding medicines are outlined above.   Tests Ordered: No orders of the defined types were placed in this encounter.   Medication Changes: Meds ordered this encounter  Medications  . ezetimibe (ZETIA) 10 MG tablet    Sig: Take 1 tablet (10 mg total) by mouth daily.    Dispense:  90 tablet    Refill:  3     Order Specific Question:   Supervising Provider    Answer:   Lauree Chandler D [[id number]]    Follow Up:  Virtual Visit or In Person in 1 year(s)  Signed, Levada Dy  Augustin Coupe, [place]  07/28/2019 12:38 PM    [facility]

## 2019-07-28 ENCOUNTER — Telehealth (INDEPENDENT_AMBULATORY_CARE_PROVIDER_SITE_OTHER): Payer: PPO | Admitting: Physician Assistant

## 2019-07-28 VITALS — BP 133/69 | Ht 66.0 in | Wt 191.0 lb

## 2019-07-28 DIAGNOSIS — Z953 Presence of xenogenic heart valve: Secondary | ICD-10-CM | POA: Diagnosis not present

## 2019-07-28 DIAGNOSIS — I10 Essential (primary) hypertension: Secondary | ICD-10-CM

## 2019-07-28 DIAGNOSIS — Z9889 Other specified postprocedural states: Secondary | ICD-10-CM

## 2019-07-28 DIAGNOSIS — E7800 Pure hypercholesterolemia, unspecified: Secondary | ICD-10-CM | POA: Diagnosis not present

## 2019-07-28 DIAGNOSIS — Z8679 Personal history of other diseases of the circulatory system: Secondary | ICD-10-CM

## 2019-07-28 DIAGNOSIS — I2510 Atherosclerotic heart disease of native coronary artery without angina pectoris: Secondary | ICD-10-CM | POA: Diagnosis not present

## 2019-07-28 MED ORDER — EZETIMIBE 10 MG PO TABS: 10.0000 mg | ORAL_TABLET | Freq: Every day | ORAL | 3 refills | Status: DC

## 2019-07-28 NOTE — Patient Instructions (Signed)
Medication Instructions:  START Eztetimibe (Zetia)  If you need a refill on your cardiac medications before your next appointment, please call your pharmacy.    Follow-Up: At [facility], you and your health needs are our priority.  As part of our continuing mission to provide you with exceptional heart care, we have created designated Provider Care Teams.  These Care Teams include your primary Cardiologist (physician) and Advanced Practice Providers (APPs -  Physician Assistants and Nurse Practitioners) who all work together to provide you with the care you need, when you need it. You will need a follow up appointment in 12 months.  Please call our office 2 months in advance to schedule this appointment.  You may see Minus Breeding, MD or one of the following Advanced Practice Providers on your designated Care Team:   Rosaria Ferries, PA-C . Jory Sims, DNP, ANP  Any Other Special Instructions Will Be Listed Below (If Applicable). None

## 2019-08-10 DIAGNOSIS — R350 Frequency of micturition: Secondary | ICD-10-CM | POA: Diagnosis not present

## 2019-08-10 DIAGNOSIS — R3915 Urgency of urination: Secondary | ICD-10-CM | POA: Diagnosis not present

## 2019-08-10 DIAGNOSIS — Q6211 Congenital occlusion of ureteropelvic junction: Secondary | ICD-10-CM | POA: Diagnosis not present

## 2019-09-19 DIAGNOSIS — H40033 Anatomical narrow angle, bilateral: Secondary | ICD-10-CM | POA: Diagnosis not present

## 2019-09-19 DIAGNOSIS — H43813 Vitreous degeneration, bilateral: Secondary | ICD-10-CM | POA: Diagnosis not present

## 2019-09-19 DIAGNOSIS — H2513 Age-related nuclear cataract, bilateral: Secondary | ICD-10-CM | POA: Diagnosis not present

## 2019-10-13 DIAGNOSIS — H2511 Age-related nuclear cataract, right eye: Secondary | ICD-10-CM | POA: Diagnosis not present

## 2019-11-07 DIAGNOSIS — Z23 Encounter for immunization: Secondary | ICD-10-CM | POA: Diagnosis not present

## 2019-11-07 DIAGNOSIS — E010 Iodine-deficiency related diffuse (endemic) goiter: Secondary | ICD-10-CM | POA: Diagnosis not present

## 2019-11-11 ENCOUNTER — Other Ambulatory Visit: Payer: Self-pay | Admitting: Endocrinology

## 2019-11-11 DIAGNOSIS — I2510 Atherosclerotic heart disease of native coronary artery without angina pectoris: Secondary | ICD-10-CM | POA: Diagnosis not present

## 2019-11-11 DIAGNOSIS — E010 Iodine-deficiency related diffuse (endemic) goiter: Secondary | ICD-10-CM

## 2019-11-11 DIAGNOSIS — I10 Essential (primary) hypertension: Secondary | ICD-10-CM | POA: Diagnosis not present

## 2019-11-11 DIAGNOSIS — E7800 Pure hypercholesterolemia, unspecified: Secondary | ICD-10-CM | POA: Diagnosis not present

## 2019-11-15 DIAGNOSIS — Z01419 Encounter for gynecological examination (general) (routine) without abnormal findings: Secondary | ICD-10-CM | POA: Diagnosis not present

## 2019-11-15 DIAGNOSIS — Z6834 Body mass index (BMI) 34.0-34.9, adult: Secondary | ICD-10-CM | POA: Diagnosis not present

## 2019-11-28 DIAGNOSIS — I2510 Atherosclerotic heart disease of native coronary artery without angina pectoris: Secondary | ICD-10-CM | POA: Diagnosis not present

## 2019-11-28 DIAGNOSIS — N3281 Overactive bladder: Secondary | ICD-10-CM | POA: Diagnosis not present

## 2019-11-28 DIAGNOSIS — I10 Essential (primary) hypertension: Secondary | ICD-10-CM | POA: Diagnosis not present

## 2019-11-28 DIAGNOSIS — E7800 Pure hypercholesterolemia, unspecified: Secondary | ICD-10-CM | POA: Diagnosis not present

## 2020-01-19 DIAGNOSIS — I10 Essential (primary) hypertension: Secondary | ICD-10-CM | POA: Diagnosis not present

## 2020-01-19 DIAGNOSIS — I2510 Atherosclerotic heart disease of native coronary artery without angina pectoris: Secondary | ICD-10-CM | POA: Diagnosis not present

## 2020-01-19 DIAGNOSIS — E7800 Pure hypercholesterolemia, unspecified: Secondary | ICD-10-CM | POA: Diagnosis not present

## 2020-02-14 DIAGNOSIS — N3941 Urge incontinence: Secondary | ICD-10-CM | POA: Diagnosis not present

## 2020-02-14 DIAGNOSIS — R3915 Urgency of urination: Secondary | ICD-10-CM | POA: Diagnosis not present

## 2020-02-14 DIAGNOSIS — R350 Frequency of micturition: Secondary | ICD-10-CM | POA: Diagnosis not present

## 2020-02-14 DIAGNOSIS — Q6211 Congenital occlusion of ureteropelvic junction: Secondary | ICD-10-CM | POA: Diagnosis not present

## 2020-02-17 DIAGNOSIS — I2510 Atherosclerotic heart disease of native coronary artery without angina pectoris: Secondary | ICD-10-CM | POA: Diagnosis not present

## 2020-02-17 DIAGNOSIS — I10 Essential (primary) hypertension: Secondary | ICD-10-CM | POA: Diagnosis not present

## 2020-02-17 DIAGNOSIS — E7800 Pure hypercholesterolemia, unspecified: Secondary | ICD-10-CM | POA: Diagnosis not present

## 2020-02-27 DIAGNOSIS — H2512 Age-related nuclear cataract, left eye: Secondary | ICD-10-CM | POA: Diagnosis not present

## 2020-02-27 DIAGNOSIS — H16223 Keratoconjunctivitis sicca, not specified as Sjogren's, bilateral: Secondary | ICD-10-CM | POA: Diagnosis not present

## 2020-02-27 DIAGNOSIS — Z961 Presence of intraocular lens: Secondary | ICD-10-CM | POA: Diagnosis not present

## 2020-02-27 DIAGNOSIS — H43813 Vitreous degeneration, bilateral: Secondary | ICD-10-CM | POA: Diagnosis not present

## 2020-02-27 DIAGNOSIS — H40033 Anatomical narrow angle, bilateral: Secondary | ICD-10-CM | POA: Diagnosis not present

## 2020-04-03 ENCOUNTER — Other Ambulatory Visit: Payer: Self-pay | Admitting: Physician Assistant

## 2020-04-04 DIAGNOSIS — Z1231 Encounter for screening mammogram for malignant neoplasm of breast: Secondary | ICD-10-CM | POA: Diagnosis not present

## 2020-05-18 DIAGNOSIS — R350 Frequency of micturition: Secondary | ICD-10-CM | POA: Diagnosis not present

## 2020-05-18 DIAGNOSIS — R8271 Bacteriuria: Secondary | ICD-10-CM | POA: Diagnosis not present

## 2020-05-29 DIAGNOSIS — Z952 Presence of prosthetic heart valve: Secondary | ICD-10-CM | POA: Diagnosis not present

## 2020-05-29 DIAGNOSIS — I2510 Atherosclerotic heart disease of native coronary artery without angina pectoris: Secondary | ICD-10-CM | POA: Diagnosis not present

## 2020-05-29 DIAGNOSIS — Z1389 Encounter for screening for other disorder: Secondary | ICD-10-CM | POA: Diagnosis not present

## 2020-05-29 DIAGNOSIS — D229 Melanocytic nevi, unspecified: Secondary | ICD-10-CM | POA: Diagnosis not present

## 2020-05-29 DIAGNOSIS — I10 Essential (primary) hypertension: Secondary | ICD-10-CM | POA: Diagnosis not present

## 2020-05-29 DIAGNOSIS — E7800 Pure hypercholesterolemia, unspecified: Secondary | ICD-10-CM | POA: Diagnosis not present

## 2020-05-29 DIAGNOSIS — Z0000 Encounter for general adult medical examination without abnormal findings: Secondary | ICD-10-CM | POA: Diagnosis not present

## 2020-05-29 DIAGNOSIS — Z1211 Encounter for screening for malignant neoplasm of colon: Secondary | ICD-10-CM | POA: Diagnosis not present

## 2020-05-29 DIAGNOSIS — N3281 Overactive bladder: Secondary | ICD-10-CM | POA: Diagnosis not present

## 2020-06-27 DIAGNOSIS — H40013 Open angle with borderline findings, low risk, bilateral: Secondary | ICD-10-CM | POA: Diagnosis not present

## 2020-06-27 DIAGNOSIS — H43813 Vitreous degeneration, bilateral: Secondary | ICD-10-CM | POA: Diagnosis not present

## 2020-06-27 DIAGNOSIS — Z961 Presence of intraocular lens: Secondary | ICD-10-CM | POA: Diagnosis not present

## 2020-06-27 DIAGNOSIS — H16223 Keratoconjunctivitis sicca, not specified as Sjogren's, bilateral: Secondary | ICD-10-CM | POA: Diagnosis not present

## 2020-06-27 DIAGNOSIS — H40033 Anatomical narrow angle, bilateral: Secondary | ICD-10-CM | POA: Diagnosis not present

## 2020-06-27 DIAGNOSIS — H2512 Age-related nuclear cataract, left eye: Secondary | ICD-10-CM | POA: Diagnosis not present

## 2020-08-14 ENCOUNTER — Other Ambulatory Visit: Payer: Self-pay

## 2020-08-14 DIAGNOSIS — E7800 Pure hypercholesterolemia, unspecified: Secondary | ICD-10-CM

## 2020-08-14 MED ORDER — EZETIMIBE 10 MG PO TABS: 10.0000 mg | ORAL_TABLET | Freq: Every day | ORAL | 0 refills | Status: DC

## 2020-08-21 NOTE — Progress Notes (Signed)
Cardiology Office Note   Date:  08/22/2020   ID:  Shaasia, Odle December 23, 1946, MRN [id number]  PCP:  Antony Contras, MD  Cardiologist:   Minus Breeding, MD   Chief Complaint  Patient presents with  . Shortness of Breath      History of Present Illness: Miranda Hutchinson is a 74 y.o. female who presents for follow up of CAD and AVR.  She has a history of  CABG x 3 (LIMA to LAD, SVG to OM, SVG to PDA), AVR and ascending aortic replacement in 07/2012.    Since I last saw her she has done well.  She denies any cardiovascular symptoms.  She has had recurrent urinary tract infections.  She has had some shortness of breath with activities but she admits that she is not doing anything but occasionally going to the grocery store.  She gets short of breath walking on level ground but this is not necessarily new.  She is not describing PND or orthopnea.  She has occasional dizziness in the morning but she is not having any palpitations, presyncope or syncope.  She has had slowly progressive weight gain.  She has had some mild lower extremity swelling that she gets by the end of the day and that is gone when she wakes up in the morning.   Past Medical History:  Diagnosis Date  . Aortic insufficiency    a. ascending aortic root aneursym with AI s/p Bentall procedure with bioprosthetic aortic valve 07/2012.  . Ascending aortic aneurysm ([place])    a. s/p Bentall 2013.  . Aspirin intolerance   . Bilateral carotid artery disease ([place])    a.  carotid duplex 2013 showed 40-59% distal RICA and 08-67% mLICA stenosis. b. Duplex 09/2017 - <40% BICA.  Marland Kitchen Coronary artery disease    a. 3 vessel ASCAD s/p CABG (LIMA to LAD, SVG to OM, SVG to PDA) at time of Bentall 07/2012.  Marland Kitchen Heart murmur   . Hyperlipidemia   . Hypothyroidism   . RBBB   . Renal insufficiency    a. Cr 1.31 in 2016, previously 1 range.    Past Surgical History:  Procedure Laterality Date  . ABDOMINAL HYSTERECTOMY  1990's  . AORTIC  VALVE REPLACEMENT  08/10/2012   Procedure: AORTIC VALVE REPLACEMENT (AVR);  Surgeon: Grace Isaac, MD;  Location: [place];  Service: Open Heart Surgery;  Laterality: N/A;  . BREAST EXCISIONAL BIOPSY Left   . CARDIAC CATHETERIZATION  08/04/2012  . CORONARY ARTERY BYPASS GRAFT  08/10/2012   Procedure: CORONARY ARTERY BYPASS GRAFTING (CABG);  Surgeon: Grace Isaac, MD;  Location: [place];  Service: Open Heart Surgery;  Laterality: N/A;  . FLEXIBLE URETEROSCOPY Left 08/13/2018   Procedure: FLEXIBLE URETEROSCOPY, Holmium laser, Basket extraction of left renal stone;  Surgeon: Ardis Hughs, MD;  [facility];  Service: Urology;  Laterality: Left;  . IR NEPHROSTOMY EXCHANGE LEFT  06/07/2018  . IR NEPHROSTOMY PLACEMENT LEFT  05/08/2018  . ROBOT ASSISTED PYELOPLASTY Left 08/13/2018   Procedure: XI ROBOTIC ASSISTED PYELOPLASTY WITH STENT PLACEMENT;  Surgeon: Ardis Hughs, MD;  [facility];  Service: Urology;  Laterality: Left;  . THORACIC AORTIC ANEURYSM REPAIR  08/10/2012   Procedure: THORACIC ASCENDING ANEURYSM REPAIR (AAA);  Surgeon: Grace Isaac, MD;  Location: [place];  Service: Open Heart Surgery;  Laterality: N/A;  . TONSILLECTOMY     "I was a little child"     Current Outpatient Medications  Medication Sig Dispense Refill  . acetaminophen (TYLENOL) 325 MG tablet Take 2 tablets (650 mg total) by mouth every 6 (six) hours as needed for fever (temp >101).    . cycloSPORINE (RESTASIS) 0.05 % ophthalmic emulsion Place 1 drop into both eyes 2 (two) times daily.     Marland Kitchen ezetimibe (ZETIA) 10 MG tablet Take 1 tablet (10 mg total) by mouth daily. 90 tablet 0  . metoprolol tartrate (LOPRESSOR) 25 MG tablet Take 25 mg by mouth 2 (two) times daily.    . rosuvastatin (CRESTOR) 40 MG tablet TAKE 1 TABLET(40 MG) BY MOUTH DAILY**CALL DOCTORS OFFICE TO MAKE APPOINTMENT FOR FURTHER REFILLS 90 tablet 0   No current facility-administered medications for this visit.    Allergies:    Percocet [oxycodone-acetaminophen] and Asa [aspirin]    ROS:  Please see the history of present illness.   Otherwise, review of systems are positive for none.   All other systems are reviewed and negative.    PHYSICAL EXAM: VS:  BP (!) 154/80   Pulse 65   Ht 5\' 6"  (1.676 m)   Wt 215 lb (97.5 kg)   SpO2 97%   BMI 34.70 kg/m  , BMI Body mass index is 34.7 kg/m. GENERAL:  Well appearing HEENT:  Pupils equal round and reactive, fundi not visualized, oral mucosa unremarkable NECK:  No jugular venous distention, waveform within normal limits, carotid upstroke brisk and symmetric, no bruits, positive thyromegaly LYMPHATICS:  No cervical, inguinal adenopathy LUNGS:  Clear to auscultation bilaterally BACK:  No CVA tenderness CHEST:  Well healed sternotomy scar. HEART:  PMI not displaced or sustained,S1 and S2 within normal limits, no S3, no S4, no clicks, no rubs, 3 out of 6 apical systolic murmur radiating slightly at the aortic outflow tract, no diastolic murmurs ABD:  Flat, positive bowel sounds normal in frequency in pitch, no bruits, no rebound, no guarding, no midline pulsatile mass, no hepatomegaly, no splenomegaly EXT:  2 plus pulses throughout, no edema, no cyanosis no clubbing SKIN:  No rashes no nodules NEURO:  Cranial nerves II through XII grossly intact, motor grossly intact throughout PSYCH:  Cognitively intact, oriented to person place and time    EKG:  EKG is ordered today. The ekg ordered today demonstrates sinus rhythm, rate 65, right bundle branch block, no change from previous.   Recent Labs: No results found for requested labs within last 8760 hours.    Lipid Panel    Component Value Date/Time   CHOL 176 10/06/2017 1400   TRIG 166 (H) 10/06/2017 1400   HDL 40 10/06/2017 1400   CHOLHDL 4.4 10/06/2017 1400   CHOLHDL 4.6 10/16/2015 0958   VLDL 18 10/16/2015 0958   LDLCALC 103 (H) 10/06/2017 1400      Wt Readings from Last 3 Encounters:  08/22/20 215 lb  (97.5 kg)  07/28/19 191 lb (86.6 kg)  08/13/18 191 lb (86.6 kg)      Other studies Reviewed: Additional studies/ records that were reviewed today include: Labs. Review of the above records demonstrates:  Please see elsewhere in the note.     ASSESSMENT AND PLAN:  CAD s/p CABG :    She has no further chest pain.  I do not think her shortness of breath represents angina.  We talked at length about starting an exercise regimen and slowly progressing this and if her shortness of breath gets worse I would further consider ischemia work-up.  S/P AVR and ascending aorta repair: I am going  to order an echocardiogram for about 6 months from now waiting till the pandemic hopefully has resolved.  She previously had stable repair.  RBBB: This is chronic.  No change in therapy.  Hyperlipidemia:    LDL 79 with an HDL of 40.  She will continue the meds as listed.  Hypertension:   Her blood pressure is elevated today but it is not at home.  Typically in the 130s.  No change in therapy.  Carotid stenosis: She has had nonobstructive disease not checked in several years.  I will follow up with carotid Dopplers in about 6 months.  COVID-19 Education:  She has had Covid and the vaccine.   Current medicines are reviewed at length with the patient today.  The patient does not have concerns regarding medicines.  The following changes have been made:  no change  Labs/ tests ordered today include:   Orders Placed This Encounter  Procedures  . EKG 12-Lead  . ECHOCARDIOGRAM COMPLETE  . VAS US CAROTID     Disposition:   FU with me in 12 months.      Signed, Minus Breeding, MD  08/22/2020 5:30 PM    [place]

## 2020-08-22 ENCOUNTER — Other Ambulatory Visit: Payer: Self-pay

## 2020-08-22 ENCOUNTER — Ambulatory Visit: Payer: PPO | Admitting: Cardiology

## 2020-08-22 ENCOUNTER — Encounter: Payer: Self-pay | Admitting: Cardiology

## 2020-08-22 VITALS — BP 154/80 | HR 65 | Ht 66.0 in | Wt 215.0 lb

## 2020-08-22 DIAGNOSIS — I779 Disorder of arteries and arterioles, unspecified: Secondary | ICD-10-CM

## 2020-08-22 DIAGNOSIS — Z952 Presence of prosthetic heart valve: Secondary | ICD-10-CM | POA: Diagnosis not present

## 2020-08-22 NOTE — Patient Instructions (Signed)
Medication Instructions:  Your Physician recommend you continue on your current medication as directed.    *If you need a refill on your cardiac medications before your next appointment, please call your pharmacy*   Lab Work: None   Testing/Procedures: Your physician has requested that you have a carotid duplex in 6 months. This test is an ultrasound of the carotid arteries in your neck. It looks at blood flow through these arteries that supply the brain with blood. Allow one hour for this exam. There are no restrictions or special instructions. [place] has requested that you have an echocardiogram in 6 months. Echocardiography is a painless test that uses sound waves to create images of your heart. It provides your doctor with information about the size and shape of your heart and how well your heart's chambers and valves are working. This procedure takes approximately one hour. There are no restrictions for this procedure. [place] [address]    Follow-Up: At [facility], you and your health needs are our priority.  As part of our continuing mission to provide you with exceptional heart care, we have created designated Provider Care Teams.  These Care Teams include your primary Cardiologist (physician) and Advanced Practice Providers (APPs -  Physician Assistants and Nurse Practitioners) who all work together to provide you with the care you need, when you need it.  We recommend signing up for the patient portal called "MyChart".  Sign up information is provided on this After Visit Summary.  MyChart is used to connect with patients for Virtual Visits (Telemedicine).  Patients are able to view lab/test results, encounter notes, upcoming appointments, etc.  Non-urgent messages can be sent to your provider as well.   To learn more about what you can do with MyChart, go to [email or web address].    Your next appointment:   1 year(s)  The  format for your next appointment:   In Person  Provider:   Minus Breeding, MD

## 2020-08-27 DIAGNOSIS — I10 Essential (primary) hypertension: Secondary | ICD-10-CM | POA: Diagnosis not present

## 2020-08-27 DIAGNOSIS — E7800 Pure hypercholesterolemia, unspecified: Secondary | ICD-10-CM | POA: Diagnosis not present

## 2020-08-27 DIAGNOSIS — I2510 Atherosclerotic heart disease of native coronary artery without angina pectoris: Secondary | ICD-10-CM | POA: Diagnosis not present

## 2020-09-04 ENCOUNTER — Encounter (HOSPITAL_COMMUNITY): Payer: Self-pay | Admitting: Cardiology

## 2020-09-10 ENCOUNTER — Other Ambulatory Visit: Payer: Self-pay

## 2020-09-10 ENCOUNTER — Ambulatory Visit (HOSPITAL_COMMUNITY)
Admission: RE | Admit: 2020-09-10 | Discharge: 2020-09-10 | Disposition: A | Discharge status: Home / Self Care | Payer: PPO | Source: Ambulatory Visit | Attending: Cardiology | Admitting: Cardiology

## 2020-09-10 DIAGNOSIS — I779 Disorder of arteries and arterioles, unspecified: Secondary | ICD-10-CM

## 2020-09-14 ENCOUNTER — Ambulatory Visit (HOSPITAL_COMMUNITY): Payer: PPO | Attending: Cardiology

## 2020-09-14 ENCOUNTER — Other Ambulatory Visit: Payer: Self-pay

## 2020-09-14 DIAGNOSIS — Z952 Presence of prosthetic heart valve: Secondary | ICD-10-CM | POA: Diagnosis not present

## 2020-09-14 LAB — ECHOCARDIOGRAM COMPLETE
AR max vel: 1.05 cm2
AV Area VTI: 1.34 cm2
AV Area mean vel: 1.09 cm2
AV Mean grad: 16 mmHg
AV Peak grad: 29.4 mmHg
Ao pk vel: 2.71 m/s
Area-P 1/2: 3.5 cm2
S' Lateral: 2 cm

## 2020-10-22 DIAGNOSIS — R3915 Urgency of urination: Secondary | ICD-10-CM | POA: Diagnosis not present

## 2020-10-22 DIAGNOSIS — R350 Frequency of micturition: Secondary | ICD-10-CM | POA: Diagnosis not present

## 2020-10-22 DIAGNOSIS — R8271 Bacteriuria: Secondary | ICD-10-CM | POA: Diagnosis not present

## 2020-11-07 DIAGNOSIS — R350 Frequency of micturition: Secondary | ICD-10-CM | POA: Diagnosis not present

## 2020-11-07 DIAGNOSIS — N3941 Urge incontinence: Secondary | ICD-10-CM | POA: Diagnosis not present

## 2020-11-07 DIAGNOSIS — R8271 Bacteriuria: Secondary | ICD-10-CM | POA: Diagnosis not present

## 2020-11-15 DIAGNOSIS — I10 Essential (primary) hypertension: Secondary | ICD-10-CM | POA: Diagnosis not present

## 2020-11-15 DIAGNOSIS — I2510 Atherosclerotic heart disease of native coronary artery without angina pectoris: Secondary | ICD-10-CM | POA: Diagnosis not present

## 2020-11-15 DIAGNOSIS — E7800 Pure hypercholesterolemia, unspecified: Secondary | ICD-10-CM | POA: Diagnosis not present

## 2020-11-15 DIAGNOSIS — K219 Gastro-esophageal reflux disease without esophagitis: Secondary | ICD-10-CM | POA: Diagnosis not present

## 2020-12-10 ENCOUNTER — Other Ambulatory Visit: Payer: Self-pay | Admitting: Endocrinology

## 2020-12-10 DIAGNOSIS — Z23 Encounter for immunization: Secondary | ICD-10-CM | POA: Diagnosis not present

## 2020-12-10 DIAGNOSIS — E010 Iodine-deficiency related diffuse (endemic) goiter: Secondary | ICD-10-CM

## 2020-12-13 DIAGNOSIS — R3915 Urgency of urination: Secondary | ICD-10-CM | POA: Diagnosis not present

## 2020-12-13 DIAGNOSIS — R8271 Bacteriuria: Secondary | ICD-10-CM | POA: Diagnosis not present

## 2020-12-13 DIAGNOSIS — R350 Frequency of micturition: Secondary | ICD-10-CM | POA: Diagnosis not present

## 2020-12-20 DIAGNOSIS — N3281 Overactive bladder: Secondary | ICD-10-CM | POA: Diagnosis not present

## 2020-12-20 DIAGNOSIS — I10 Essential (primary) hypertension: Secondary | ICD-10-CM | POA: Diagnosis not present

## 2020-12-20 DIAGNOSIS — E7800 Pure hypercholesterolemia, unspecified: Secondary | ICD-10-CM | POA: Diagnosis not present

## 2020-12-20 DIAGNOSIS — Z952 Presence of prosthetic heart valve: Secondary | ICD-10-CM | POA: Diagnosis not present

## 2020-12-20 DIAGNOSIS — I2510 Atherosclerotic heart disease of native coronary artery without angina pectoris: Secondary | ICD-10-CM | POA: Diagnosis not present

## 2020-12-24 ENCOUNTER — Other Ambulatory Visit: Payer: PPO

## 2021-01-11 ENCOUNTER — Ambulatory Visit
Admission: RE | Admit: 2021-01-11 | Discharge: 2021-01-11 | Disposition: A | Discharge status: Home / Self Care | Payer: PPO | Source: Ambulatory Visit | Attending: Endocrinology | Admitting: Endocrinology

## 2021-01-11 DIAGNOSIS — E010 Iodine-deficiency related diffuse (endemic) goiter: Secondary | ICD-10-CM

## 2021-01-11 DIAGNOSIS — E042 Nontoxic multinodular goiter: Secondary | ICD-10-CM | POA: Diagnosis not present

## 2021-01-16 ENCOUNTER — Other Ambulatory Visit: Payer: Self-pay | Admitting: Endocrinology

## 2021-01-16 DIAGNOSIS — E010 Iodine-deficiency related diffuse (endemic) goiter: Secondary | ICD-10-CM

## 2021-01-29 ENCOUNTER — Other Ambulatory Visit: Payer: Self-pay

## 2021-01-29 ENCOUNTER — Ambulatory Visit
Admission: RE | Admit: 2021-01-29 | Discharge: 2021-01-29 | Disposition: A | Discharge status: Home / Self Care | Payer: PPO | Source: Ambulatory Visit | Attending: Endocrinology | Admitting: Endocrinology

## 2021-01-29 ENCOUNTER — Other Ambulatory Visit (HOSPITAL_COMMUNITY)
Admission: RE | Admit: 2021-01-29 | Discharge: 2021-01-29 | Disposition: A | Discharge status: Home / Self Care | Payer: PPO | Source: Ambulatory Visit | Attending: Radiology | Admitting: Radiology

## 2021-01-29 DIAGNOSIS — E010 Iodine-deficiency related diffuse (endemic) goiter: Secondary | ICD-10-CM

## 2021-01-29 DIAGNOSIS — D440 Neoplasm of uncertain behavior of thyroid gland: Secondary | ICD-10-CM | POA: Insufficient documentation

## 2021-01-29 DIAGNOSIS — E041 Nontoxic single thyroid nodule: Secondary | ICD-10-CM | POA: Diagnosis not present

## 2021-01-31 LAB — CYTOLOGY - NON PAP

## 2021-02-14 DIAGNOSIS — I2510 Atherosclerotic heart disease of native coronary artery without angina pectoris: Secondary | ICD-10-CM | POA: Diagnosis not present

## 2021-02-14 DIAGNOSIS — I10 Essential (primary) hypertension: Secondary | ICD-10-CM | POA: Diagnosis not present

## 2021-02-14 DIAGNOSIS — K219 Gastro-esophageal reflux disease without esophagitis: Secondary | ICD-10-CM | POA: Diagnosis not present

## 2021-02-14 DIAGNOSIS — E7800 Pure hypercholesterolemia, unspecified: Secondary | ICD-10-CM | POA: Diagnosis not present

## 2021-02-15 DIAGNOSIS — H43813 Vitreous degeneration, bilateral: Secondary | ICD-10-CM | POA: Diagnosis not present

## 2021-02-15 DIAGNOSIS — H2512 Age-related nuclear cataract, left eye: Secondary | ICD-10-CM | POA: Diagnosis not present

## 2021-02-15 DIAGNOSIS — Z961 Presence of intraocular lens: Secondary | ICD-10-CM | POA: Diagnosis not present

## 2021-02-15 DIAGNOSIS — H40013 Open angle with borderline findings, low risk, bilateral: Secondary | ICD-10-CM | POA: Diagnosis not present

## 2021-02-15 DIAGNOSIS — H40033 Anatomical narrow angle, bilateral: Secondary | ICD-10-CM | POA: Diagnosis not present

## 2021-02-15 DIAGNOSIS — H16223 Keratoconjunctivitis sicca, not specified as Sjogren's, bilateral: Secondary | ICD-10-CM | POA: Diagnosis not present

## 2021-03-17 DIAGNOSIS — H2512 Age-related nuclear cataract, left eye: Secondary | ICD-10-CM | POA: Diagnosis not present

## 2021-03-21 DIAGNOSIS — H2512 Age-related nuclear cataract, left eye: Secondary | ICD-10-CM | POA: Diagnosis not present

## 2021-04-24 DIAGNOSIS — E7800 Pure hypercholesterolemia, unspecified: Secondary | ICD-10-CM | POA: Diagnosis not present

## 2021-04-24 DIAGNOSIS — I10 Essential (primary) hypertension: Secondary | ICD-10-CM | POA: Diagnosis not present

## 2021-04-24 DIAGNOSIS — K219 Gastro-esophageal reflux disease without esophagitis: Secondary | ICD-10-CM | POA: Diagnosis not present

## 2021-04-24 DIAGNOSIS — I2510 Atherosclerotic heart disease of native coronary artery without angina pectoris: Secondary | ICD-10-CM | POA: Diagnosis not present

## 2021-04-30 DIAGNOSIS — Z1231 Encounter for screening mammogram for malignant neoplasm of breast: Secondary | ICD-10-CM | POA: Diagnosis not present

## 2021-04-30 DIAGNOSIS — Z01419 Encounter for gynecological examination (general) (routine) without abnormal findings: Secondary | ICD-10-CM | POA: Diagnosis not present

## 2021-04-30 DIAGNOSIS — Z6833 Body mass index (BMI) 33.0-33.9, adult: Secondary | ICD-10-CM | POA: Diagnosis not present

## 2021-05-01 ENCOUNTER — Other Ambulatory Visit: Payer: Self-pay | Admitting: Obstetrics and Gynecology

## 2021-05-01 DIAGNOSIS — R928 Other abnormal and inconclusive findings on diagnostic imaging of breast: Secondary | ICD-10-CM

## 2021-05-02 DIAGNOSIS — E7800 Pure hypercholesterolemia, unspecified: Secondary | ICD-10-CM | POA: Diagnosis not present

## 2021-05-02 DIAGNOSIS — K219 Gastro-esophageal reflux disease without esophagitis: Secondary | ICD-10-CM | POA: Diagnosis not present

## 2021-05-02 DIAGNOSIS — I10 Essential (primary) hypertension: Secondary | ICD-10-CM | POA: Diagnosis not present

## 2021-05-02 DIAGNOSIS — I2510 Atherosclerotic heart disease of native coronary artery without angina pectoris: Secondary | ICD-10-CM | POA: Diagnosis not present

## 2021-06-19 DIAGNOSIS — I10 Essential (primary) hypertension: Secondary | ICD-10-CM | POA: Diagnosis not present

## 2021-06-19 DIAGNOSIS — E7800 Pure hypercholesterolemia, unspecified: Secondary | ICD-10-CM | POA: Diagnosis not present

## 2021-06-19 DIAGNOSIS — I2510 Atherosclerotic heart disease of native coronary artery without angina pectoris: Secondary | ICD-10-CM | POA: Diagnosis not present

## 2021-06-19 DIAGNOSIS — K219 Gastro-esophageal reflux disease without esophagitis: Secondary | ICD-10-CM | POA: Diagnosis not present

## 2021-07-03 ENCOUNTER — Ambulatory Visit
Admission: RE | Admit: 2021-07-03 | Discharge: 2021-07-03 | Disposition: A | Discharge status: Home / Self Care | Payer: PPO | Source: Ambulatory Visit | Attending: Obstetrics and Gynecology | Admitting: Obstetrics and Gynecology

## 2021-07-03 ENCOUNTER — Other Ambulatory Visit: Payer: Self-pay | Admitting: Obstetrics and Gynecology

## 2021-07-03 ENCOUNTER — Other Ambulatory Visit: Payer: Self-pay

## 2021-07-03 DIAGNOSIS — R928 Other abnormal and inconclusive findings on diagnostic imaging of breast: Secondary | ICD-10-CM

## 2021-07-03 DIAGNOSIS — R922 Inconclusive mammogram: Secondary | ICD-10-CM | POA: Diagnosis not present

## 2021-07-16 DIAGNOSIS — Z952 Presence of prosthetic heart valve: Secondary | ICD-10-CM | POA: Diagnosis not present

## 2021-07-16 DIAGNOSIS — I10 Essential (primary) hypertension: Secondary | ICD-10-CM | POA: Diagnosis not present

## 2021-07-16 DIAGNOSIS — I2510 Atherosclerotic heart disease of native coronary artery without angina pectoris: Secondary | ICD-10-CM | POA: Diagnosis not present

## 2021-07-16 DIAGNOSIS — Z0000 Encounter for general adult medical examination without abnormal findings: Secondary | ICD-10-CM | POA: Diagnosis not present

## 2021-07-16 DIAGNOSIS — R42 Dizziness and giddiness: Secondary | ICD-10-CM | POA: Diagnosis not present

## 2021-07-16 DIAGNOSIS — R252 Cramp and spasm: Secondary | ICD-10-CM | POA: Diagnosis not present

## 2021-07-16 DIAGNOSIS — E7800 Pure hypercholesterolemia, unspecified: Secondary | ICD-10-CM | POA: Diagnosis not present

## 2021-07-16 DIAGNOSIS — Z1211 Encounter for screening for malignant neoplasm of colon: Secondary | ICD-10-CM | POA: Diagnosis not present

## 2021-07-16 DIAGNOSIS — N3281 Overactive bladder: Secondary | ICD-10-CM | POA: Diagnosis not present

## 2021-08-22 NOTE — Progress Notes (Signed)
Cardiology Office Note   Date:  08/25/2021   ID:  Madlyne, Quillman 1946/04/14, MRN [id number]  PCP:  Antony Contras, MD  Cardiologist:   Minus Breeding, MD   Chief Complaint  Patient presents with   Shortness of Breath       History of Present Illness: Miranda Hutchinson is a 75 y.o. female who presents for follow up of CAD and AVR.  She has a history of  CABG x 3 (LIMA to LAD, SVG to OM, SVG to PDA), AVR and ascending aortic replacement in 07/2012.    Since I last saw her she she told her primary provider that she had some shortness of breath with activity.  He actually reduced her metoprolol and she feels a little bit better.  She says she will get short of breath doing things like housework.  She has to stop and take a break.  She is not describing PND or orthopnea.  She is not describing chest pressure, neck or arm discomfort.  She has had no weight gain other than a few pounds over the course of the last year.  She has had no new edema.  She has had a little dizziness on standing occasionally.   Past Medical History:  Diagnosis Date   Aortic insufficiency    a. ascending aortic root aneursym with AI s/p Bentall procedure with bioprosthetic aortic valve 07/2012.   Ascending aortic aneurysm ([place])    a. s/p Bentall 2013.   Aspirin intolerance    Bilateral carotid artery disease ([place])    a.  carotid duplex 2013 showed 40-59% distal RICA and 123456 mLICA stenosis. b. Duplex 09/2017 - <40% BICA.   Coronary artery disease    a. 3 vessel ASCAD s/p CABG (LIMA to LAD, SVG to OM, SVG to PDA) at time of Bentall 07/2012.   Heart murmur    Hyperlipidemia    Hypothyroidism    RBBB    Renal insufficiency    a. Cr 1.31 in 2016, previously 1 range.    Past Surgical History:  Procedure Laterality Date   ABDOMINAL HYSTERECTOMY  1990's   AORTIC VALVE REPLACEMENT  08/10/2012   Procedure: AORTIC VALVE REPLACEMENT (AVR);  Surgeon: Grace Isaac, MD;  Location: [facility];  Service: Open  Heart Surgery;  Laterality: N/A;   BREAST EXCISIONAL BIOPSY Left    CARDIAC CATHETERIZATION  08/04/2012   CORONARY ARTERY BYPASS GRAFT  08/10/2012   Procedure: CORONARY ARTERY BYPASS GRAFTING (CABG);  Surgeon: Grace Isaac, MD;  Location: [facility];  Service: Open Heart Surgery;  Laterality: N/A;   FLEXIBLE URETEROSCOPY Left 08/13/2018   Procedure: FLEXIBLE URETEROSCOPY, Holmium laser, Basket extraction of left renal stone;  Surgeon: Ardis Hughs, MD;  [facility];  Service: Urology;  Laterality: Left;   IR NEPHROSTOMY EXCHANGE LEFT  06/07/2018   IR NEPHROSTOMY PLACEMENT LEFT  05/08/2018   ROBOT ASSISTED PYELOPLASTY Left 08/13/2018   Procedure: XI ROBOTIC ASSISTED PYELOPLASTY WITH STENT PLACEMENT;  Surgeon: Ardis Hughs, MD;  [facility];  Service: Urology;  Laterality: Left;   THORACIC AORTIC ANEURYSM REPAIR  08/10/2012   Procedure: THORACIC ASCENDING ANEURYSM REPAIR (AAA);  Surgeon: Grace Isaac, MD;  Location: [facility];  Service: Open Heart Surgery;  Laterality: N/A;   TONSILLECTOMY     "I was a little child"     Current Outpatient Medications  Medication Sig Dispense Refill   acetaminophen (TYLENOL) 325 MG tablet Take 2 tablets (650 mg  total) by mouth every 6 (six) hours as needed for fever (temp >101).     cycloSPORINE (RESTASIS) 0.05 % ophthalmic emulsion Place 1 drop into both eyes 2 (two) times daily.     metoprolol tartrate (LOPRESSOR) 25 MG tablet Take 12.5 mg by mouth 2 (two) times daily.     rosuvastatin (CRESTOR) 40 MG tablet TAKE 1 TABLET(40 MG) BY MOUTH DAILY**CALL DOCTORS OFFICE TO MAKE APPOINTMENT FOR FURTHER REFILLS 90 tablet 0   ezetimibe (ZETIA) 10 MG tablet Take 1 tablet (10 mg total) by mouth daily. 90 tablet 3   No current facility-administered medications for this visit.    Allergies:   Percocet [oxycodone-acetaminophen] and Asa [aspirin]    ROS:  Please see the history of present illness.   Otherwise, review of systems are positive for none.    All other systems are reviewed and negative.    PHYSICAL EXAM: VS:  BP 123/73   Pulse 69   Ht 5' 5.5" (1.664 m)   Wt 206 lb 12.8 oz (93.8 kg)   SpO2 97%   BMI 33.89 kg/m  , BMI Body mass index is 33.89 kg/m. GENERAL:  Well appearing NECK:  No jugular venous distention, waveform within normal limits, carotid upstroke brisk and symmetric, no bruits, positive thyromegaly LUNGS:  Clear to auscultation bilaterally CHEST:  Well healed sternotomy scar. HEART:  PMI not displaced or sustained,S1 and S2 within normal limits, no S3, no S4, no clicks, no rubs, 3 out of 6 brief apical systolic murmur radiating at the aortic outflow tract, no diastolic murmurs ABD:  Flat, positive bowel sounds normal in frequency in pitch, no bruits, no rebound, no guarding, no midline pulsatile mass, no hepatomegaly, no splenomegaly EXT:  2 plus pulses throughout, no edema, no cyanosis no clubbing   EKG:  EKG is  ordered today. The ekg ordered today demonstrates sinus rhythm, rate 69 , right bundle branch block, no change from previous.   Recent Labs: No results found for requested labs within last 8760 hours.    Lipid Panel    Component Value Date/Time   CHOL 176 10/06/2017 1400   TRIG 166 (H) 10/06/2017 1400   HDL 40 10/06/2017 1400   CHOLHDL 4.4 10/06/2017 1400   CHOLHDL 4.6 10/16/2015 0958   VLDL 18 10/16/2015 0958   LDLCALC 103 (H) 10/06/2017 1400      Wt Readings from Last 3 Encounters:  08/23/21 206 lb 12.8 oz (93.8 kg)  08/22/20 215 lb (97.5 kg)  07/28/19 191 lb (86.6 kg)      Other studies Reviewed: Additional studies/ records that were reviewed today include: Labs. Review of the above records demonstrates:  Please see elsewhere in the note.     ASSESSMENT AND PLAN:  CAD s/p CABG :    The patient has no new sypmtoms.  No further cardiovascular testing is indicated.  We will continue with aggressive risk reduction and meds as listed.  S/P AVR and ascending aorta repair:   Given  the shortness of breath I will check a BNP.  I will also repeat an echocardiogram although she had stable repair last year.    RBBB: This is stable.  No change in therapy.  I will chronic.  No change in therapy.   Hyperlipidemia:    LDL was 88 with an HDL of 44.  No change in therapy.    Hypertension:   Her blood pressure is well controlled.  No change in therapy.    Current medicines are  reviewed at length with the patient today.  The patient does not have concerns regarding medicines.  The following changes have been made:  None  Labs/ tests ordered today include:   Orders Placed This Encounter  Procedures   EKG 12-Lead   ECHOCARDIOGRAM COMPLETE      Disposition:   FU with me in 12 months.      Signed, Minus Breeding, MD  08/25/2021 9:42 AM    [facility]

## 2021-08-23 ENCOUNTER — Ambulatory Visit: Payer: PPO | Admitting: Cardiology

## 2021-08-23 ENCOUNTER — Other Ambulatory Visit: Payer: Self-pay

## 2021-08-23 ENCOUNTER — Encounter: Payer: Self-pay | Admitting: Cardiology

## 2021-08-23 VITALS — BP 123/73 | HR 69 | Ht 65.5 in | Wt 206.8 lb

## 2021-08-23 DIAGNOSIS — R0600 Dyspnea, unspecified: Secondary | ICD-10-CM

## 2021-08-23 DIAGNOSIS — I4510 Unspecified right bundle-branch block: Secondary | ICD-10-CM

## 2021-08-23 DIAGNOSIS — E7800 Pure hypercholesterolemia, unspecified: Secondary | ICD-10-CM | POA: Diagnosis not present

## 2021-08-23 DIAGNOSIS — I10 Essential (primary) hypertension: Secondary | ICD-10-CM

## 2021-08-23 DIAGNOSIS — E785 Hyperlipidemia, unspecified: Secondary | ICD-10-CM | POA: Diagnosis not present

## 2021-08-23 DIAGNOSIS — I2510 Atherosclerotic heart disease of native coronary artery without angina pectoris: Secondary | ICD-10-CM | POA: Diagnosis not present

## 2021-08-23 DIAGNOSIS — Z952 Presence of prosthetic heart valve: Secondary | ICD-10-CM | POA: Diagnosis not present

## 2021-08-23 MED ORDER — EZETIMIBE 10 MG PO TABS: 10.0000 mg | ORAL_TABLET | Freq: Every day | ORAL | 3 refills | Status: DC

## 2021-08-23 NOTE — Patient Instructions (Signed)
Medication Instructions:  NO CHANGES  *If you need a refill on your cardiac medications before your next appointment, please call your pharmacy*   Lab Work: Dr. Percival Spanish advises that your PCP check the following labs: BNP, Magnesium  If you have labs (blood work) drawn today and your tests are completely normal, you will receive your results only by: [place] (if you have MyChart) OR A paper copy in the mail If you have any lab test that is abnormal or we need to change your treatment, we will call you to review the results.   Testing/Procedures: Your physician has requested that you have an echocardiogram. Echocardiography is a painless test that uses sound waves to create images of your heart. It provides your doctor with information about the size and shape of your heart and how well your heart's chambers and valves are working. This procedure takes approximately one hour. There are no restrictions for this procedure. -- [address]   Follow-Up: At [facility], you and your health needs are our priority.  As part of our continuing mission to provide you with exceptional heart care, we have created designated Provider Care Teams.  These Care Teams include your primary Cardiologist (physician) and Advanced Practice Providers (APPs -  Physician Assistants and Nurse Practitioners) who all work together to provide you with the care you need, when you need it.  We recommend signing up for the patient portal called "MyChart".  Sign up information is provided on this After Visit Summary.  MyChart is used to connect with patients for Virtual Visits (Telemedicine).  Patients are able to view lab/test results, encounter notes, upcoming appointments, etc.  Non-urgent messages can be sent to your provider as well.   To learn more about what you can do with MyChart, go to [email or web address].    Your next appointment:   12 month(s)  The format for your next  appointment:   In Person  Provider:   You may see Minus Breeding, MD or one of the following Advanced Practice Providers on your designated Care Team:   Rosaria Ferries, PA-C Caron Presume, PA-C Jory Sims, DNP, ANP   Other Instructions

## 2021-08-25 ENCOUNTER — Encounter: Payer: Self-pay | Admitting: Cardiology

## 2021-09-16 ENCOUNTER — Ambulatory Visit (HOSPITAL_COMMUNITY): Payer: PPO

## 2021-10-02 ENCOUNTER — Other Ambulatory Visit: Payer: Self-pay

## 2021-10-02 ENCOUNTER — Ambulatory Visit (HOSPITAL_COMMUNITY): Payer: PPO | Attending: Cardiovascular Disease

## 2021-10-02 DIAGNOSIS — Z952 Presence of prosthetic heart valve: Secondary | ICD-10-CM | POA: Diagnosis not present

## 2021-10-02 DIAGNOSIS — R0600 Dyspnea, unspecified: Secondary | ICD-10-CM | POA: Diagnosis not present

## 2021-10-02 LAB — ECHOCARDIOGRAM COMPLETE
AR max vel: 1.29 cm2
AV Area VTI: 1.35 cm2
AV Area mean vel: 1.41 cm2
AV Mean grad: 15 mmHg
AV Peak grad: 27.6 mmHg
Ao pk vel: 2.63 m/s
Area-P 1/2: 3.17 cm2
S' Lateral: 2.3 cm

## 2021-10-11 ENCOUNTER — Encounter: Payer: Self-pay | Admitting: *Deleted

## 2021-10-24 DIAGNOSIS — E7800 Pure hypercholesterolemia, unspecified: Secondary | ICD-10-CM | POA: Diagnosis not present

## 2021-10-24 DIAGNOSIS — I2510 Atherosclerotic heart disease of native coronary artery without angina pectoris: Secondary | ICD-10-CM | POA: Diagnosis not present

## 2021-10-24 DIAGNOSIS — I10 Essential (primary) hypertension: Secondary | ICD-10-CM | POA: Diagnosis not present

## 2021-10-24 DIAGNOSIS — K219 Gastro-esophageal reflux disease without esophagitis: Secondary | ICD-10-CM | POA: Diagnosis not present

## 2021-12-02 DIAGNOSIS — H16223 Keratoconjunctivitis sicca, not specified as Sjogren's, bilateral: Secondary | ICD-10-CM | POA: Diagnosis not present

## 2021-12-02 DIAGNOSIS — Z961 Presence of intraocular lens: Secondary | ICD-10-CM | POA: Diagnosis not present

## 2021-12-02 DIAGNOSIS — H40013 Open angle with borderline findings, low risk, bilateral: Secondary | ICD-10-CM | POA: Diagnosis not present

## 2021-12-02 DIAGNOSIS — H43813 Vitreous degeneration, bilateral: Secondary | ICD-10-CM | POA: Diagnosis not present

## 2021-12-02 DIAGNOSIS — H40033 Anatomical narrow angle, bilateral: Secondary | ICD-10-CM | POA: Diagnosis not present

## 2022-02-04 DIAGNOSIS — E7800 Pure hypercholesterolemia, unspecified: Secondary | ICD-10-CM | POA: Diagnosis not present

## 2022-02-04 DIAGNOSIS — I10 Essential (primary) hypertension: Secondary | ICD-10-CM | POA: Diagnosis not present

## 2022-02-04 DIAGNOSIS — K219 Gastro-esophageal reflux disease without esophagitis: Secondary | ICD-10-CM | POA: Diagnosis not present

## 2022-04-18 DIAGNOSIS — R42 Dizziness and giddiness: Secondary | ICD-10-CM | POA: Diagnosis not present

## 2022-04-18 DIAGNOSIS — R519 Headache, unspecified: Secondary | ICD-10-CM | POA: Diagnosis not present

## 2022-04-18 DIAGNOSIS — G4700 Insomnia, unspecified: Secondary | ICD-10-CM | POA: Diagnosis not present

## 2022-04-18 DIAGNOSIS — Z634 Disappearance and death of family member: Secondary | ICD-10-CM | POA: Diagnosis not present

## 2022-05-16 ENCOUNTER — Telehealth: Payer: Self-pay | Admitting: Cardiology

## 2022-05-16 NOTE — Telephone Encounter (Signed)
*  STAT* If patient is at the pharmacy, call can be transferred to refill team.   1. Which medications need to be refilled? (please list name of each medication and dose if known) rosuvastatin (CRESTOR) 40 MG tablet  2. Which pharmacy/location (including street and city if local pharmacy) is medication to be sent to? [facility] [phone] - [place], [address] AT [address]  3. Do they need a 30 day or 90 day supply? 90 day

## 2022-05-21 MED ORDER — ROSUVASTATIN CALCIUM 40 MG PO TABS: ORAL_TABLET | ORAL | 3 refills | Status: AC

## 2022-06-02 DIAGNOSIS — I4510 Unspecified right bundle-branch block: Secondary | ICD-10-CM | POA: Insufficient documentation

## 2022-06-02 DIAGNOSIS — E785 Hyperlipidemia, unspecified: Secondary | ICD-10-CM | POA: Insufficient documentation

## 2022-06-02 NOTE — Progress Notes (Unsigned)
Cardiology Office Note   Date:  06/03/2022   ID:  Miranda Hutchinson, DOB 10/27/1946, MRN [id number]  PCP:  Antony Contras, MD  Cardiologist:   Minus Breeding, MD   Chief Complaint  Patient presents with   Coronary Artery Disease       History of Present Illness: Miranda Hutchinson is a 76 y.o. female who presents for follow up of CAD and AVR.  She has a history of  CABG x 3 (LIMA to LAD, SVG to OM, SVG to PDA), AVR and ascending aortic replacement in 07/2012.   She had a stable valve appearance on echo 2022.    Since I last saw her she has had new onset chest discomfort.  This is from 2 different kinds.  There is some under the left breast.  This is sharp and shooting.  However she is also getting some mid chest discomfort.  This is being starting over a couple of weeks.  It can be 4 out of 10 in intensity.  It is dull.  It happens at rest.  She had some decreased exercise tolerance and think she fatigues easier.  She had some shortness of breath with activity.  She is not describing PND or orthopnea.  She denies describing neck or arm discomfort.  This discomfort is not associated with nausea vomiting or diaphoresis.  She is not describing any new palpitations, presyncope or syncope.  It goes away spontaneously after a few to several minutes.   Past Medical History:  Diagnosis Date   Aortic insufficiency    a. ascending aortic root aneursym with AI s/p Bentall procedure with bioprosthetic aortic valve 07/2012.   Ascending aortic aneurysm ([place])    a. s/p Bentall 2013.   Aspirin intolerance    Bilateral carotid artery disease ([place])    a.  carotid duplex 2013 showed 40-59% distal RICA and 09-38% mLICA stenosis. b. Duplex 09/2017 - <40% BICA.   Coronary artery disease    a. 3 vessel ASCAD s/p CABG (LIMA to LAD, SVG to OM, SVG to PDA) at time of Bentall 07/2012.   Heart murmur    Hyperlipidemia    Hypothyroidism    RBBB    Renal insufficiency    a. Cr 1.31 in 2016, previously 1 range.     Past Surgical History:  Procedure Laterality Date   ABDOMINAL HYSTERECTOMY  1990's   AORTIC VALVE REPLACEMENT  08/10/2012   Procedure: AORTIC VALVE REPLACEMENT (AVR);  Surgeon: Grace Isaac, MD;  Location: [facility];  Service: Open Heart Surgery;  Laterality: N/A;   BREAST EXCISIONAL BIOPSY Left    CARDIAC CATHETERIZATION  08/04/2012   CORONARY ARTERY BYPASS GRAFT  08/10/2012   Procedure: CORONARY ARTERY BYPASS GRAFTING (CABG);  Surgeon: Grace Isaac, MD;  Location: [facility];  Service: Open Heart Surgery;  Laterality: N/A;   FLEXIBLE URETEROSCOPY Left 08/13/2018   Procedure: FLEXIBLE URETEROSCOPY, Holmium laser, Basket extraction of left renal stone;  Surgeon: Ardis Hughs, MD;  [facility];  Service: Urology;  Laterality: Left;   IR NEPHROSTOMY EXCHANGE LEFT  06/07/2018   IR NEPHROSTOMY PLACEMENT LEFT  05/08/2018   ROBOT ASSISTED PYELOPLASTY Left 08/13/2018   Procedure: XI ROBOTIC ASSISTED PYELOPLASTY WITH STENT PLACEMENT;  Surgeon: Ardis Hughs, MD;  [facility];  Service: Urology;  Laterality: Left;   THORACIC AORTIC ANEURYSM REPAIR  08/10/2012   Procedure: THORACIC ASCENDING ANEURYSM REPAIR (AAA);  Surgeon: Grace Isaac, MD;  Location: [facility];  Service: Open Heart Surgery;  Laterality: N/A;   TONSILLECTOMY     "I was a little child"     Current Outpatient Medications  Medication Sig Dispense Refill   acetaminophen (TYLENOL) 325 MG tablet Take 2 tablets (650 mg total) by mouth every 6 (six) hours as needed for fever (temp >101).     cycloSPORINE (RESTASIS) 0.05 % ophthalmic emulsion Place 1 drop into both eyes 2 (two) times daily.     metoprolol tartrate (LOPRESSOR) 25 MG tablet Take 12.5 mg by mouth 2 (two) times daily.     rosuvastatin (CRESTOR) 40 MG tablet TAKE 1 TABLET(40 MG) BY MOUTH DAILY**CALL DOCTORS OFFICE TO MAKE APPOINTMENT FOR FURTHER REFILLS 90 tablet 3   ezetimibe (ZETIA) 10 MG tablet Take 1 tablet (10 mg total) by mouth daily. 90 tablet 3    No current facility-administered medications for this visit.    Allergies:   Percocet [oxycodone-acetaminophen] and Asa [aspirin]    ROS:  Please see the history of present illness.   Otherwise, review of systems are positive for none.   All other systems are reviewed and negative.    PHYSICAL EXAM: VS:  BP 136/78   Pulse 84   Ht '5\' 6"'$  (1.676 m)   Wt 206 lb 12.8 oz (93.8 kg)   SpO2 98%   BMI 33.38 kg/m  , BMI Body mass index is 33.38 kg/m. GENERAL:  Well appearing NECK:  No jugular venous distention, waveform within normal limits, carotid upstroke brisk and symmetric, no bruits, positive thyromegaly LUNGS:  Clear to auscultation bilaterally CHEST:  Well healed sternotomy scar. HEART:  PMI not displaced or sustained,S1 and S2 within normal limits, no S3, no S4, no clicks, no rubs, no murmurs ABD:  Flat, positive bowel sounds normal in frequency in pitch, no bruits, no rebound, no guarding, no midline pulsatile mass, no hepatomegaly, no splenomegaly EXT:  2 plus pulses throughout, no edema, no cyanosis no clubbing   EKG:  EKG is  ordered today. The ekg ordered today demonstrates sinus rhythm, rate 84, right bundle branch block, no change from previous.   Recent Labs: No results found for requested labs within last 8760 hours.    Lipid Panel    Component Value Date/Time   CHOL 176 10/06/2017 1400   TRIG 166 (H) 10/06/2017 1400   HDL 40 10/06/2017 1400   CHOLHDL 4.4 10/06/2017 1400   CHOLHDL 4.6 10/16/2015 0958   VLDL 18 10/16/2015 0958   LDLCALC 103 (H) 10/06/2017 1400      Wt Readings from Last 3 Encounters:  06/03/22 206 lb 12.8 oz (93.8 kg)  08/23/21 206 lb 12.8 oz (93.8 kg)  08/22/20 215 lb (97.5 kg)      Other studies Reviewed: Additional studies/ records that were reviewed today include: Labs. Review of the above records demonstrates:  Please see elsewhere in the note.     ASSESSMENT AND PLAN:  CAD s/p CABG :   Given the chest discomfort I think  stress testing would be indicated.  She would not be able walk on a treadmill.  I would like to send her for a cardiac PET.    S/P AVR and ascending aorta repair:   She had stable repair on repeat echo in 2021.  No change in therapy.  RBBB: This is chronic.  No change in therapy.   Hyperlipidemia:    LDL was 84.  No change in therapy.   Hypertension:   Her blood pressure is controlled.  No  change in therapy.   Current medicines are reviewed at length with the patient today.  The patient does not have concerns regarding medicines.  The following changes have been made:  None  Labs/ tests ordered today include:   Orders Placed This Encounter  Procedures   NM PET CT CARDIAC PERFUSION MULTI W/ABSOLUTE BLOODFLOW   EKG 12-Lead      Disposition:   FU with me in 12 months.      Signed, Minus Breeding, MD  06/03/2022 5:12 PM    [facility]

## 2022-06-03 ENCOUNTER — Encounter: Payer: Self-pay | Admitting: Cardiology

## 2022-06-03 ENCOUNTER — Ambulatory Visit (INDEPENDENT_AMBULATORY_CARE_PROVIDER_SITE_OTHER): Payer: No Typology Code available for payment source | Admitting: Cardiology

## 2022-06-03 VITALS — BP 136/78 | HR 84 | Ht 66.0 in | Wt 206.8 lb

## 2022-06-03 DIAGNOSIS — R079 Chest pain, unspecified: Secondary | ICD-10-CM | POA: Diagnosis not present

## 2022-06-03 DIAGNOSIS — I2510 Atherosclerotic heart disease of native coronary artery without angina pectoris: Secondary | ICD-10-CM | POA: Diagnosis not present

## 2022-06-03 DIAGNOSIS — I10 Essential (primary) hypertension: Secondary | ICD-10-CM | POA: Diagnosis not present

## 2022-06-03 DIAGNOSIS — I4510 Unspecified right bundle-branch block: Secondary | ICD-10-CM | POA: Diagnosis not present

## 2022-06-03 DIAGNOSIS — Z952 Presence of prosthetic heart valve: Secondary | ICD-10-CM

## 2022-06-03 DIAGNOSIS — E785 Hyperlipidemia, unspecified: Secondary | ICD-10-CM

## 2022-06-03 MED ORDER — EZETIMIBE 10 MG PO TABS: 10.0000 mg | ORAL_TABLET | Freq: Every day | ORAL | 3 refills | Status: AC

## 2022-06-03 NOTE — Patient Instructions (Signed)
Medication Instructions:  Your physician recommends that you continue on your current medications as directed. Please refer to the Current Medication list given to you today.  *If you need a refill on your cardiac medications before your next appointment, please call your pharmacy*  Testing/Procedures: CARDIAC PET- Your physician has requested that you have a Cardiac Pet Stress Test. This testing is completed at [facility] ([place], [place] [address]). The schedulers will call you to get this scheduled. Please follow instructions below and call the office with any questions/concerns [phone]).  Follow-Up: At [facility], you and your health needs are our priority.  As part of our continuing mission to provide you with exceptional heart care, we have created designated Provider Care Teams.  These Care Teams include your primary Cardiologist (physician) and Advanced Practice Providers (APPs -  Physician Assistants and Nurse Practitioners) who all work together to provide you with the care you need, when you need it.  We recommend signing up for the patient portal called "MyChart".  Sign up information is provided on this After Visit Summary.  MyChart is used to connect with patients for Virtual Visits (Telemedicine).  Patients are able to view lab/test results, encounter notes, upcoming appointments, etc.  Non-urgent messages can be sent to your provider as well.   To learn more about what you can do with MyChart, go to [email or web address].    Your next appointment:   12 month(s)  The format for your next appointment:   In Person  Provider:   Minus Breeding, MD {    Other Instructions How to Prepare for Your Cardiac PET/CT Stress Test:  1. Please do not take these medications before your test:   Medications that may interfere with the cardiac pharmacological stress agent (ex. nitrates - including erectile dysfunction medications or  beta-blockers) the day of the exam. (Erectile dysfunction medication should be held for at least 72 hrs prior to test) Theophylline containing medications for 12 hours. Dipyridamole 48 hours prior to the test. Your remaining medications may be taken with water.  2. Nothing to eat or drink, except water, 3 hours prior to arrival time.   NO caffeine/decaffeinated products, or chocolate 12 hours prior to arrival.  3. NO perfume, cologne or lotion  4. Total time is 1 to 2 hours; you may want to bring reading material for the waiting time.  5. Please report to Admitting at the [facility] Main Entrance 60 minutes early for your test.  [place], [place] [address]  Diabetic Preparation:  Hold oral medications. You may take NPH and Lantus insulin. Do not take Humalog or Humulin R (Regular Insulin) the day of your test. Check blood sugars prior to leaving the house. If able to eat breakfast prior to 3 hour fasting, you may take all medications, including your insulin, Do not worry if you miss your breakfast dose of insulin - start at your next meal.  IF YOU THINK YOU MAY BE PREGNANT, OR ARE NURSING PLEASE INFORM THE TECHNOLOGIST.  In preparation for your appointment, medication and supplies will be purchased.  Appointment availability is limited, so if you need to cancel or reschedule, please call the Radiology Department at [phone]  24 hours in advance to avoid a cancellation fee of $100.00  What to Expect After you Arrive:  Once you arrive and check in for your appointment, you will be taken to a preparation room within the Radiology Department.  A  technologist or Nurse will obtain your medical history, verify that you are correctly prepped for the exam, and explain the procedure.  Afterwards,  an IV will be started in your arm and electrodes will be placed on your skin for EKG monitoring during the stress portion of the exam. Then you will be escorted to the  PET/CT scanner.  There, staff will get you positioned on the scanner and obtain a blood pressure and EKG.  During the exam, you will continue to be connected to the EKG and blood pressure machines.  A small, safe amount of a radioactive tracer will be injected in your IV to obtain a series of pictures of your heart along with an injection of a stress agent.    After your Exam:  It is recommended that you eat a meal and drink a caffeinated beverage to counter act any effects of the stress agent.  Drink plenty of fluids for the remainder of the day and urinate frequently for the first couple of hours after the exam.  Your doctor will inform you of your test results within 7-10 business days.  For questions about your test or how to prepare for your test, please call: Marchia Bond, Cardiac Imaging Nurse Navigator  Gordy Clement, Cardiac Imaging Nurse Navigator Office: [phone]

## 2022-06-06 DIAGNOSIS — H16223 Keratoconjunctivitis sicca, not specified as Sjogren's, bilateral: Secondary | ICD-10-CM | POA: Diagnosis not present

## 2022-06-06 DIAGNOSIS — H43813 Vitreous degeneration, bilateral: Secondary | ICD-10-CM | POA: Diagnosis not present

## 2022-06-06 DIAGNOSIS — Z961 Presence of intraocular lens: Secondary | ICD-10-CM | POA: Diagnosis not present

## 2022-06-06 DIAGNOSIS — H11821 Conjunctivochalasis, right eye: Secondary | ICD-10-CM | POA: Diagnosis not present

## 2022-06-06 DIAGNOSIS — H40033 Anatomical narrow angle, bilateral: Secondary | ICD-10-CM | POA: Diagnosis not present

## 2022-06-06 DIAGNOSIS — H40013 Open angle with borderline findings, low risk, bilateral: Secondary | ICD-10-CM | POA: Diagnosis not present

## 2022-06-13 DIAGNOSIS — I2510 Atherosclerotic heart disease of native coronary artery without angina pectoris: Secondary | ICD-10-CM | POA: Diagnosis not present

## 2022-06-13 DIAGNOSIS — Z952 Presence of prosthetic heart valve: Secondary | ICD-10-CM | POA: Diagnosis not present

## 2022-06-13 DIAGNOSIS — Z1211 Encounter for screening for malignant neoplasm of colon: Secondary | ICD-10-CM | POA: Diagnosis not present

## 2022-09-08 ENCOUNTER — Telehealth (HOSPITAL_COMMUNITY): Payer: Self-pay | Admitting: *Deleted

## 2022-09-08 NOTE — Telephone Encounter (Signed)
Attempted to call patient regarding upcoming cardiac PET appointment and reminded patient to avoid caffeine 12 hours prior to appointment.  Left message on voicemail with name and callback number  Gordy Clement RN Navigator Cardiac [facility] [phone] Office [phone] Cell

## 2022-09-09 ENCOUNTER — Ambulatory Visit (HOSPITAL_COMMUNITY): Admission: RE | Admit: 2022-09-09 | Payer: No Typology Code available for payment source | Source: Ambulatory Visit

## 2022-10-09 DIAGNOSIS — Z1331 Encounter for screening for depression: Secondary | ICD-10-CM | POA: Diagnosis not present

## 2022-10-09 DIAGNOSIS — Z0000 Encounter for general adult medical examination without abnormal findings: Secondary | ICD-10-CM | POA: Diagnosis not present

## 2022-10-09 DIAGNOSIS — Z952 Presence of prosthetic heart valve: Secondary | ICD-10-CM | POA: Diagnosis not present

## 2022-10-09 DIAGNOSIS — Z23 Encounter for immunization: Secondary | ICD-10-CM | POA: Diagnosis not present

## 2022-10-09 DIAGNOSIS — I2510 Atherosclerotic heart disease of native coronary artery without angina pectoris: Secondary | ICD-10-CM | POA: Diagnosis not present

## 2022-10-09 DIAGNOSIS — N3281 Overactive bladder: Secondary | ICD-10-CM | POA: Diagnosis not present

## 2022-10-09 DIAGNOSIS — Z634 Disappearance and death of family member: Secondary | ICD-10-CM | POA: Diagnosis not present

## 2022-10-09 DIAGNOSIS — I10 Essential (primary) hypertension: Secondary | ICD-10-CM | POA: Diagnosis not present

## 2022-10-09 DIAGNOSIS — Z1231 Encounter for screening mammogram for malignant neoplasm of breast: Secondary | ICD-10-CM | POA: Diagnosis not present

## 2022-10-09 DIAGNOSIS — E7800 Pure hypercholesterolemia, unspecified: Secondary | ICD-10-CM | POA: Diagnosis not present

## 2022-10-09 DIAGNOSIS — L659 Nonscarring hair loss, unspecified: Secondary | ICD-10-CM | POA: Diagnosis not present

## 2022-10-09 DIAGNOSIS — R399 Unspecified symptoms and signs involving the genitourinary system: Secondary | ICD-10-CM | POA: Diagnosis not present

## 2022-10-16 ENCOUNTER — Other Ambulatory Visit: Payer: Self-pay | Admitting: Family Medicine

## 2022-10-16 DIAGNOSIS — Z1231 Encounter for screening mammogram for malignant neoplasm of breast: Secondary | ICD-10-CM

## 2022-11-04 ENCOUNTER — Other Ambulatory Visit: Payer: Self-pay | Admitting: Family Medicine

## 2022-11-04 DIAGNOSIS — N6320 Unspecified lump in the left breast, unspecified quadrant: Secondary | ICD-10-CM

## 2023-02-06 DIAGNOSIS — I700 Atherosclerosis of aorta: Secondary | ICD-10-CM | POA: Diagnosis not present

## 2023-02-06 DIAGNOSIS — N1832 Chronic kidney disease, stage 3b: Secondary | ICD-10-CM | POA: Diagnosis not present

## 2023-02-06 DIAGNOSIS — E669 Obesity, unspecified: Secondary | ICD-10-CM | POA: Diagnosis not present

## 2023-02-06 DIAGNOSIS — E785 Hyperlipidemia, unspecified: Secondary | ICD-10-CM | POA: Diagnosis not present

## 2023-02-06 DIAGNOSIS — Z6834 Body mass index (BMI) 34.0-34.9, adult: Secondary | ICD-10-CM | POA: Diagnosis not present

## 2023-02-06 DIAGNOSIS — Z008 Encounter for other general examination: Secondary | ICD-10-CM | POA: Diagnosis not present

## 2023-02-06 DIAGNOSIS — I712 Thoracic aortic aneurysm, without rupture, unspecified: Secondary | ICD-10-CM | POA: Diagnosis not present

## 2023-02-06 DIAGNOSIS — I129 Hypertensive chronic kidney disease with stage 1 through stage 4 chronic kidney disease, or unspecified chronic kidney disease: Secondary | ICD-10-CM | POA: Diagnosis not present

## 2023-04-23 DIAGNOSIS — I2510 Atherosclerotic heart disease of native coronary artery without angina pectoris: Secondary | ICD-10-CM | POA: Diagnosis not present

## 2023-04-23 DIAGNOSIS — Z952 Presence of prosthetic heart valve: Secondary | ICD-10-CM | POA: Diagnosis not present

## 2023-04-23 DIAGNOSIS — N1831 Chronic kidney disease, stage 3a: Secondary | ICD-10-CM | POA: Diagnosis not present

## 2023-04-23 DIAGNOSIS — L821 Other seborrheic keratosis: Secondary | ICD-10-CM | POA: Diagnosis not present

## 2023-04-23 DIAGNOSIS — E7800 Pure hypercholesterolemia, unspecified: Secondary | ICD-10-CM | POA: Diagnosis not present

## 2023-04-23 DIAGNOSIS — N3281 Overactive bladder: Secondary | ICD-10-CM | POA: Diagnosis not present

## 2023-04-23 DIAGNOSIS — I10 Essential (primary) hypertension: Secondary | ICD-10-CM | POA: Diagnosis not present

## 2023-06-10 ENCOUNTER — Ambulatory Visit
Admission: EM | Admit: 2023-06-10 | Discharge: 2023-06-10 | Disposition: A | Discharge status: Home / Self Care | Payer: No Typology Code available for payment source | Attending: Emergency Medicine | Admitting: Emergency Medicine

## 2023-06-10 DIAGNOSIS — J069 Acute upper respiratory infection, unspecified: Secondary | ICD-10-CM | POA: Diagnosis not present

## 2023-06-10 DIAGNOSIS — U071 COVID-19: Secondary | ICD-10-CM | POA: Diagnosis not present

## 2023-06-10 NOTE — Discharge Instructions (Signed)
I recommend using Mucinex per package directions for at least the next few days to help thin out congestion and help it drain. Using nasal saline spray will also help with this - you can use it several times a day while sick.   You will get a call if test is positive, you will not get a call if test is negative but you can check results in MyChart if you have a MyChart account.

## 2023-06-10 NOTE — ED Triage Notes (Signed)
Patient here today with c/o productive cough since Saturday. On Saturday and Sunday she has nose bleeds with headaches. On Sunday she had some dizziness whenever she stood up. She has also had some loss of appetite. She has taken Coricidin with no improvement. She also took Mucinex with no relief. She has a h/o HTN and takes metoprolol.

## 2023-06-10 NOTE — ED Provider Notes (Signed)
[facility]    CSN: [id number] Arrival date & time: 06/10/23  1339      History   Chief Complaint Chief Complaint  Patient presents with   Cough    HPI Miranda Hutchinson is a 77 y.o. female. She has been sick since 06/06/23 with congestion, cough. Denies ear pain or sore throat. Denies fever. Does have post nasal drainage. Had a nosebleed x1 on 06/06/23 and another one on 06/07/23. Took Coricidin HBP those days and attributes nosebleeds to the medicine, so stopped taking it. Took Mucinex once 06/08/23 and didn't feel like it helped so stopped taking it. Actually feels better today. She is here for 2 main reasons - first, her church has a covid outbreak and she wants tested because she has been exposed. Second, she had a brief episode of dizziness yesterday. She woke up yesterday, sat for a minute on the side of hte bed like usual, then got up. Once in the hallway, she felt dizzy (lightheaded- the room was not spinning), held onto the wall and made her way to the couch. Episode was brief and resolved spontaneously. Has not happened since. Has been taking her once daily metoprolol at inconsistent times. Did not pass out or feel like she was going to pass own.      Cough   Past Medical History:  Diagnosis Date   Aortic insufficiency    a. ascending aortic root aneursym with AI s/p Bentall procedure with bioprosthetic aortic valve 07/2012.   Ascending aortic aneurysm (HCC)    a. s/p Bentall 2013.   Aspirin intolerance    Bilateral carotid artery disease (HCC)    a.  carotid duplex 2013 showed 40-59% distal RICA and 40-59% mLICA stenosis. b. Duplex 09/2017 - <40% BICA.   Coronary artery disease    a. 3 vessel ASCAD s/p CABG (LIMA to LAD, SVG to OM, SVG to PDA) at time of Bentall 07/2012.   Heart murmur    Hyperlipidemia    Hypothyroidism    RBBB    Renal insufficiency    a. Cr 1.31 in 2016, previously 1 range.    Patient Active Problem List   Diagnosis Date Noted   RBBB  06/02/2022   Dyslipidemia 06/02/2022   UPJ obstruction, congenital 08/13/2018   Flank pain 06/03/2018   CKD (chronic kidney disease) stage 3, GFR 30-59 ml/min (HCC) 06/03/2018   Complicated UTI (urinary tract infection) 06/03/2018   AKI (acute kidney injury) (HCC) 05/07/2018   Severe sepsis (HCC) 05/07/2018   Sepsis secondary to UTI (HCC) 05/07/2018   Septic shock (HCC) 05/07/2018   Urinary tract infection with hematuria    Bilateral carotid artery disease (HCC) 05/06/2018   S/P ascending aortic aneurysm repair 05/06/2018   Hypotension 05/06/2018   Multinodular goiter 09/12/2015   Hyperthyroidism 09/12/2015   Dizziness 07/03/2014   Blurred vision 07/03/2014   Headache(784.0) 07/03/2014   S/P CABG x 3 08/16/2012   S/P ascending aortic replacement 08/16/2012   S/P AVR (aortic valve replacement) 08/16/2012   Essential hypertension, benign 08/05/2012   Pure hypercholesterolemia 08/05/2012   Obesity, unspecified 08/05/2012   Coronary artery disease 08/04/2012   Aortic valve insufficiency 08/04/2012   Dilated aortic root (HCC) 08/04/2012    Past Surgical History:  Procedure Laterality Date   ABDOMINAL HYSTERECTOMY  1990's   AORTIC VALVE REPLACEMENT  08/10/2012   Procedure: AORTIC VALVE REPLACEMENT (AVR);  Surgeon: Delight Ovens, MD;  Location: [facility] OR;  Service: Open Heart Surgery;  Laterality: N/A;  BREAST EXCISIONAL BIOPSY Left    CARDIAC CATHETERIZATION  08/04/2012   CORONARY ARTERY BYPASS GRAFT  08/10/2012   Procedure: CORONARY ARTERY BYPASS GRAFTING (CABG);  Surgeon: Delight Ovens, MD;  Location: [facility] OR;  Service: Open Heart Surgery;  Laterality: N/A;   FLEXIBLE URETEROSCOPY Left 08/13/2018   Procedure: FLEXIBLE URETEROSCOPY, Holmium laser, Basket extraction of left renal stone;  Surgeon: Crist Fat, MD;  Location: [facility] ORS;  Service: Urology;  Laterality: Left;   IR NEPHROSTOMY EXCHANGE LEFT  06/07/2018   IR NEPHROSTOMY PLACEMENT LEFT  05/08/2018   ROBOT ASSISTED  PYELOPLASTY Left 08/13/2018   Procedure: XI ROBOTIC ASSISTED PYELOPLASTY WITH STENT PLACEMENT;  Surgeon: Crist Fat, MD;  Location: [facility] ORS;  Service: Urology;  Laterality: Left;   THORACIC AORTIC ANEURYSM REPAIR  08/10/2012   Procedure: THORACIC ASCENDING ANEURYSM REPAIR (AAA);  Surgeon: Delight Ovens, MD;  Location: [facility] OR;  Service: Open Heart Surgery;  Laterality: N/A;   TONSILLECTOMY     "I was a little child"    OB History   No obstetric history on file.      Home Medications    Prior to Admission medications   Medication Sig Start Date End Date Taking? Authorizing Provider  cycloSPORINE (RESTASIS) 0.05 % ophthalmic emulsion Place 1 drop into both eyes 2 (two) times daily.   Yes [provider]  metoprolol tartrate (LOPRESSOR) 25 MG tablet Take 12.5 mg by mouth 2 (two) times daily.   Yes [provider]  rosuvastatin (CRESTOR) 40 MG tablet TAKE 1 TABLET(40 MG) BY MOUTH DAILY**CALL DOCTORS OFFICE TO MAKE APPOINTMENT FOR FURTHER REFILLS 05/21/22  Yes Rollene Rotunda, MD  acetaminophen (TYLENOL) 325 MG tablet Take 2 tablets (650 mg total) by mouth every 6 (six) hours as needed for fever (temp >101). 05/13/18   Zannie Cove, MD  ezetimibe (ZETIA) 10 MG tablet Take 1 tablet (10 mg total) by mouth daily. 06/03/22 05/29/23  Rollene Rotunda, MD    Family History Family History  Problem Relation Age of Onset   Lung cancer Mother    Thyroid disease Mother     Social History Social History   Tobacco Use   Smoking status: Former    Packs/day: 0.05    Years: 2.00    Additional pack years: 0.00    Total pack years: 0.10    Types: Cigarettes    Quit date: 12/30/1983    Years since quitting: 39.4   Smokeless tobacco: Never  Vaping Use   Vaping Use: Never used  Substance Use Topics   Alcohol use: No   Drug use: No     Allergies   Percocet [oxycodone-acetaminophen] and Asa [aspirin]   Review of Systems Review of Systems  Respiratory:  Positive for  cough.      Physical Exam Triage Vital Signs ED Triage Vitals [06/10/23 1441]  Enc Vitals Group     BP (!) 158/79     Pulse Rate 79     Resp 16     Temp 98.5 F (36.9 C)     Temp Source Oral     SpO2 95 %     Weight 215 lb (97.5 kg)     Height 5' 6.5" (1.689 m)     Head Circumference      Peak Flow      Pain Score 0     Pain Loc      Pain Edu?      Excl. in GC?    No  data found.  Updated Vital Signs BP (!) 158/79 (BP Location: Left Arm)   Pulse 79   Temp 98.5 F (36.9 C) (Oral)   Resp 16   Ht 5' 6.5" (1.689 m)   Wt 215 lb (97.5 kg)   SpO2 95%   BMI 34.18 kg/m   Visual Acuity Right Eye Distance:   Left Eye Distance:   Bilateral Distance:    Right Eye Near:   Left Eye Near:    Bilateral Near:     Physical Exam Constitutional:      General: She is not in acute distress.    Appearance: Normal appearance. She is ill-appearing.  HENT:     Right Ear: Ear canal and external ear normal.     Left Ear: Ear canal and external ear normal.     Ears:     Comments: Some cerumen in B ear canals - I can see corner of TMs and they appear normal.     Nose: Congestion present.     Comments: No active bleeding- does have mucus in nose and tissue is mildly inflamed likely due to uri    Mouth/Throat:     Mouth: Mucous membranes are moist.     Pharynx: Oropharynx is clear.  Cardiovascular:     Rate and Rhythm: Normal rate and regular rhythm.     Heart sounds: Murmur heard.  Pulmonary:     Effort: Pulmonary effort is normal.     Breath sounds: Normal breath sounds.  Lymphadenopathy:     Head:     Right side of head: No submandibular adenopathy.     Left side of head: No submandibular adenopathy.  Neurological:     Mental Status: She is alert.      UC Treatments / Results  Labs (all labs ordered are listed, but only abnormal results are displayed) Labs Reviewed  SARS CORONAVIRUS 2 (TAT 6-24 HRS)    EKG   Radiology No results  found.  Procedures Procedures (including critical care time)  Medications Ordered in UC Medications - No data to display  Initial Impression / Assessment and Plan / UC Course  I have reviewed the triage vital signs and the nursing notes.  Pertinent labs & imaging results that were available during my care of the patient were reviewed by me and considered in my medical decision making (see chart for details).    Covid test results pending. Dizziness could be from variable timing of taking metoprolol, could be related to congestion from URI, or could be something else. Pt will monitor sx and check BP if she feels dizzy again. Dsicussed taking metoprolol at consistnet time each day. Will f/u with pcp if dizziness occurs again.   Final Clinical Impressions(s) / UC Diagnoses   Final diagnoses:  Upper respiratory tract infection, unspecified type     Discharge Instructions      I recommend using Mucinex per package directions for at least the next few days to help thin out congestion and help it drain. Using nasal saline spray will also help with this - you can use it several times a day while sick.   You will get a call if test is positive, you will not get a call if test is negative but you can check results in MyChart if you have a MyChart account.     ED Prescriptions   None    PDMP not reviewed this encounter.   Cathlyn Parsons, NP 06/10/23 1535

## 2023-06-11 LAB — SARS CORONAVIRUS 2 (TAT 6-24 HRS): SARS Coronavirus 2: POSITIVE — AB

## 2023-06-22 ENCOUNTER — Ambulatory Visit
Admission: EM | Admit: 2023-06-22 | Discharge: 2023-06-22 | Disposition: A | Discharge status: Home / Self Care | Payer: No Typology Code available for payment source

## 2023-06-22 DIAGNOSIS — Z09 Encounter for follow-up examination after completed treatment for conditions other than malignant neoplasm: Secondary | ICD-10-CM

## 2023-06-22 NOTE — ED Triage Notes (Signed)
Pt reports she tested positive for COVID on 06/10/23. Pt needs a letter to go back to work. Pt denies any symptoms.

## 2023-06-22 NOTE — Discharge Instructions (Signed)
We will send you with a work release note Any complications return back for follow-up

## 2023-06-22 NOTE — ED Provider Notes (Signed)
[facility]    CSN: [id number] Arrival date & time: 06/22/23  7062      History   Chief Complaint Chief Complaint  Patient presents with   COVID test    HPI Miranda Hutchinson is a 77 y.o. female.   Patient arrives today for a release back to work note.  Patient tested positive on 06/10/2023 for COVID.  Her employer needs a release note.  Patient denies any complaints and states that she feels fine no cough no congestion no fever no chest pain.    Past Medical History:  Diagnosis Date   Aortic insufficiency    a. ascending aortic root aneursym with AI s/p Bentall procedure with bioprosthetic aortic valve 07/2012.   Ascending aortic aneurysm (HCC)    a. s/p Bentall 2013.   Aspirin intolerance    Bilateral carotid artery disease (HCC)    a.  carotid duplex 2013 showed 40-59% distal RICA and 40-59% mLICA stenosis. b. Duplex 09/2017 - <40% BICA.   Coronary artery disease    a. 3 vessel ASCAD s/p CABG (LIMA to LAD, SVG to OM, SVG to PDA) at time of Bentall 07/2012.   Heart murmur    Hyperlipidemia    Hypothyroidism    RBBB    Renal insufficiency    a. Cr 1.31 in 2016, previously 1 range.    Patient Active Problem List   Diagnosis Date Noted   RBBB 06/02/2022   Dyslipidemia 06/02/2022   UPJ obstruction, congenital 08/13/2018   Flank pain 06/03/2018   CKD (chronic kidney disease) stage 3, GFR 30-59 ml/min (HCC) 06/03/2018   Complicated UTI (urinary tract infection) 06/03/2018   AKI (acute kidney injury) (HCC) 05/07/2018   Severe sepsis (HCC) 05/07/2018   Sepsis secondary to UTI (HCC) 05/07/2018   Septic shock (HCC) 05/07/2018   Urinary tract infection with hematuria    Bilateral carotid artery disease (HCC) 05/06/2018   S/P ascending aortic aneurysm repair 05/06/2018   Hypotension 05/06/2018   Multinodular goiter 09/12/2015   Hyperthyroidism 09/12/2015   Dizziness 07/03/2014   Blurred vision 07/03/2014   Headache(784.0) 07/03/2014   S/P CABG x 3  08/16/2012   S/P ascending aortic replacement 08/16/2012   S/P AVR (aortic valve replacement) 08/16/2012   Essential hypertension, benign 08/05/2012   Pure hypercholesterolemia 08/05/2012   Obesity, unspecified 08/05/2012   Coronary artery disease 08/04/2012   Aortic valve insufficiency 08/04/2012   Dilated aortic root (HCC) 08/04/2012    Past Surgical History:  Procedure Laterality Date   ABDOMINAL HYSTERECTOMY  1990's   AORTIC VALVE REPLACEMENT  08/10/2012   Procedure: AORTIC VALVE REPLACEMENT (AVR);  Surgeon: Delight Ovens, MD;  Location: [facility] OR;  Service: Open Heart Surgery;  Laterality: N/A;   BREAST EXCISIONAL BIOPSY Left    CARDIAC CATHETERIZATION  08/04/2012   CORONARY ARTERY BYPASS GRAFT  08/10/2012   Procedure: CORONARY ARTERY BYPASS GRAFTING (CABG);  Surgeon: Delight Ovens, MD;  Location: [facility] OR;  Service: Open Heart Surgery;  Laterality: N/A;   FLEXIBLE URETEROSCOPY Left 08/13/2018   Procedure: FLEXIBLE URETEROSCOPY, Holmium laser, Basket extraction of left renal stone;  Surgeon: Crist Fat, MD;  Location: [facility] ORS;  Service: Urology;  Laterality: Left;   IR NEPHROSTOMY EXCHANGE LEFT  06/07/2018   IR NEPHROSTOMY PLACEMENT LEFT  05/08/2018   ROBOT ASSISTED PYELOPLASTY Left 08/13/2018   Procedure: XI ROBOTIC ASSISTED PYELOPLASTY WITH STENT PLACEMENT;  Surgeon: Crist Fat, MD;  Location: [facility] ORS;  Service: Urology;  Laterality: Left;  THORACIC AORTIC ANEURYSM REPAIR  08/10/2012   Procedure: THORACIC ASCENDING ANEURYSM REPAIR (AAA);  Surgeon: Delight Ovens, MD;  Location: [facility] OR;  Service: Open Heart Surgery;  Laterality: N/A;   TONSILLECTOMY     "I was a little child"    OB History   No obstetric history on file.      Home Medications    Prior to Admission medications   Medication Sig Start Date End Date Taking? Authorizing Provider  acetaminophen (TYLENOL) 325 MG tablet Take 2 tablets (650 mg total) by mouth every 6 (six) hours as needed for fever  (temp >101). 05/13/18   Zannie Cove, MD  cycloSPORINE (RESTASIS) 0.05 % ophthalmic emulsion Place 1 drop into both eyes 2 (two) times daily.    [provider]  ezetimibe (ZETIA) 10 MG tablet Take 1 tablet (10 mg total) by mouth daily. 06/03/22 05/29/23  Rollene Rotunda, MD  metoprolol tartrate (LOPRESSOR) 25 MG tablet Take 12.5 mg by mouth 2 (two) times daily.    [provider]  rosuvastatin (CRESTOR) 40 MG tablet TAKE 1 TABLET(40 MG) BY MOUTH DAILY**CALL DOCTORS OFFICE TO MAKE APPOINTMENT FOR FURTHER REFILLS 05/21/22   Rollene Rotunda, MD    Family History Family History  Problem Relation Age of Onset   Lung cancer Mother    Thyroid disease Mother     Social History Social History   Tobacco Use   Smoking status: Former    Packs/day: 0.05    Years: 2.00    Additional pack years: 0.00    Total pack years: 0.10    Types: Cigarettes    Quit date: 12/30/1983    Years since quitting: 39.5   Smokeless tobacco: Never  Vaping Use   Vaping Use: Never used  Substance Use Topics   Alcohol use: No   Drug use: No     Allergies   Percocet [oxycodone-acetaminophen] and Asa [aspirin]   Review of Systems Review of Systems  Constitutional: Negative.   Respiratory: Negative.    Cardiovascular: Negative.   Gastrointestinal: Negative.   Genitourinary: Negative.   Neurological: Negative.      Physical Exam Triage Vital Signs ED Triage Vitals  Enc Vitals Group     BP 06/22/23 1033 (!) 160/81     Pulse Rate 06/22/23 1033 78     Resp 06/22/23 1033 16     Temp 06/22/23 1033 98.5 F (36.9 C)     Temp Source 06/22/23 1033 Oral     SpO2 06/22/23 1033 95 %     Weight --      Height --      Head Circumference --      Peak Flow --      Pain Score 06/22/23 1039 0     Pain Loc --      Pain Edu? --      Excl. in GC? --    No data found.  Updated Vital Signs BP (!) 160/81 (BP Location: Left Arm)   Pulse 78   Temp 98.5 F (36.9 C) (Oral)   Resp 16   SpO2 95%    Visual Acuity Right Eye Distance:   Left Eye Distance:   Bilateral Distance:    Right Eye Near:   Left Eye Near:    Bilateral Near:     Physical Exam Constitutional:      Appearance: Normal appearance.  Cardiovascular:     Rate and Rhythm: Normal rate.  Pulmonary:     Effort: Pulmonary effort  is normal.  Abdominal:     General: Abdomen is flat.  Musculoskeletal:        General: Normal range of motion.  Neurological:     Mental Status: She is alert.      UC Treatments / Results  Labs (all labs ordered are listed, but only abnormal results are displayed) Labs Reviewed - No data to display  EKG   Radiology No results found.  Procedures Procedures (including critical care time)  Medications Ordered in UC Medications - No data to display  Initial Impression / Assessment and Plan / UC Course  I have reviewed the triage vital signs and the nursing notes.  Pertinent labs & imaging results that were available during my care of the patient were reviewed by me and considered in my medical decision making (see chart for details).     Patient states that she is well enough to return to work and needs a return to work note Final Clinical Impressions(s) / UC Diagnoses   Final diagnoses:  None   Discharge Instructions   None    ED Prescriptions   None    PDMP not reviewed this encounter.   Coralyn Mark, NP 06/22/23 [phone]

## 2023-07-29 ENCOUNTER — Encounter: Payer: Self-pay | Admitting: Gastroenterology

## 2023-08-25 DIAGNOSIS — R1032 Left lower quadrant pain: Secondary | ICD-10-CM | POA: Diagnosis not present

## 2023-08-25 DIAGNOSIS — R42 Dizziness and giddiness: Secondary | ICD-10-CM | POA: Diagnosis not present

## 2023-08-25 DIAGNOSIS — I10 Essential (primary) hypertension: Secondary | ICD-10-CM | POA: Diagnosis not present

## 2023-08-26 ENCOUNTER — Encounter: Payer: Self-pay | Admitting: Neurology

## 2023-10-06 ENCOUNTER — Ambulatory Visit: Payer: Medicare Other | Admitting: Neurology

## 2023-10-06 DIAGNOSIS — Z029 Encounter for administrative examinations, unspecified: Secondary | ICD-10-CM

## 2023-10-07 ENCOUNTER — Encounter: Payer: Self-pay | Admitting: Neurology

## 2023-10-13 ENCOUNTER — Telehealth: Payer: Self-pay | Admitting: Nurse Practitioner

## 2023-10-13 ENCOUNTER — Ambulatory Visit: Payer: Medicare Other | Admitting: Nurse Practitioner

## 2023-10-13 NOTE — Telephone Encounter (Signed)
Good Morning Miranda Hutchinson,   Patient called stating that she would not be able to come to her appointment with you today at 11:30 due to having issues with her insurance. Patient stated she will call back once she gets the insurance problems resolved.

## 2023-10-13 NOTE — Progress Notes (Deleted)
10/13/2023 TAMANI NAZARETH [id number] Oct 12, 1946   CHIEF COMPLAINT: Discuss scheduling: Skippy  HISTORY OF PRESENT ILLNESS: Kaleesi Silvan. Gaston is a 77 year old female with a past medical history of coronary artery disease s/p CABG 07/2012, ascending aortic aneurysm s/p bioprosthetic aortic valve surgery 07/2012, carotid artery disease, hypothyroidism,  .cbc   Past Medical History:  Diagnosis Date   Aortic insufficiency    a. ascending aortic root aneursym with AI s/p Bentall procedure with bioprosthetic aortic valve 07/2012.   Ascending aortic aneurysm (HCC)    a. s/p Bentall 2013.   Aspirin intolerance    Bilateral carotid artery disease (HCC)    a.  carotid duplex 2013 showed 40-59% distal RICA and 40-59% mLICA stenosis. b. Duplex 09/2017 - <40% BICA.   Coronary artery disease    a. 3 vessel ASCAD s/p CABG (LIMA to LAD, SVG to OM, SVG to PDA) at time of Bentall 07/2012.   Heart murmur    Hyperlipidemia    Hypothyroidism    RBBB    Renal insufficiency    a. Cr 1.31 in 2016, previously 1 range.   Past Surgical History:  Procedure Laterality Date   ABDOMINAL HYSTERECTOMY  1990's   AORTIC VALVE REPLACEMENT  08/10/2012   Procedure: AORTIC VALVE REPLACEMENT (AVR);  Surgeon: Delight Ovens, MD;  Location: [facility] OR;  Service: Open Heart Surgery;  Laterality: N/A;   BREAST EXCISIONAL BIOPSY Left    CARDIAC CATHETERIZATION  08/04/2012   CORONARY ARTERY BYPASS GRAFT  08/10/2012   Procedure: CORONARY ARTERY BYPASS GRAFTING (CABG);  Surgeon: Delight Ovens, MD;  Location: [facility] OR;  Service: Open Heart Surgery;  Laterality: N/A;   FLEXIBLE URETEROSCOPY Left 08/13/2018   Procedure: FLEXIBLE URETEROSCOPY, Holmium laser, Basket extraction of left renal stone;  Surgeon: Crist Fat, MD;  Location: [facility];  Service: Urology;  Laterality: Left;   IR NEPHROSTOMY EXCHANGE LEFT  06/07/2018   IR NEPHROSTOMY PLACEMENT LEFT  05/08/2018   ROBOT ASSISTED PYELOPLASTY Left 08/13/2018   Procedure:  XI ROBOTIC ASSISTED PYELOPLASTY WITH STENT PLACEMENT;  Surgeon: Crist Fat, MD;  Location: [facility];  Service: Urology;  Laterality: Left;   THORACIC AORTIC ANEURYSM REPAIR  08/10/2012   Procedure: THORACIC ASCENDING ANEURYSM REPAIR (AAA);  Surgeon: Delight Ovens, MD;  Location: [facility] OR;  Service: Open Heart Surgery;  Laterality: N/A;   TONSILLECTOMY     "I was a little child"   Social History:  Family History:    reports that she quit smoking about 39 years ago. Her smoking use included cigarettes. She started smoking about 41 years ago. She has a 0.1 pack-year smoking history. She has never used smokeless tobacco. She reports that she does not drink alcohol and does not use drugs. family history includes Lung cancer in her mother; Thyroid disease in her mother. Allergies  Allergen Reactions   Percocet [Oxycodone-Acetaminophen] Other (See Comments)    Hallucinations    Asa [Aspirin] Nausea Only and Other (See Comments)    "hurts my stomach"      Outpatient Encounter Medications as of 10/13/2023  Medication Sig   acetaminophen (TYLENOL) 325 MG tablet Take 2 tablets (650 mg total) by mouth every 6 (six) hours as needed for fever (temp >101).   cycloSPORINE (RESTASIS) 0.05 % ophthalmic emulsion Place 1 drop into both eyes 2 (two) times daily.   ezetimibe (ZETIA) 10 MG tablet Take 1 tablet (10 mg total) by mouth daily.   metoprolol tartrate (LOPRESSOR) 25 MG tablet  Take 12.5 mg by mouth 2 (two) times daily.   rosuvastatin (CRESTOR) 40 MG tablet TAKE 1 TABLET(40 MG) BY MOUTH DAILY**CALL DOCTORS OFFICE TO MAKE APPOINTMENT FOR FURTHER REFILLS   No facility-administered encounter medications on file as of 10/13/2023.     REVIEW OF SYSTEMS:  Gen: Denies fever, sweats or chills. No weight loss.  CV: Denies chest pain, palpitations or edema. Resp: Denies cough, shortness of breath of hemoptysis.  GI: Denies heartburn, dysphagia, stomach or lower abdominal pain. No diarrhea or  constipation.  GU: Denies urinary burning, blood in urine, increased urinary frequency or incontinence. MS: Denies joint pain, muscles aches or weakness. Derm: Denies rash, itchiness, skin lesions or unhealing ulcers. Psych: Denies depression, anxiety, memory loss or confusion. Heme: Denies bruising, easy bleeding. Neuro:  Denies headaches, dizziness or paresthesias. Endo:  Denies any problems with DM, thyroid or adrenal function.  PHYSICAL EXAM: There were no vitals taken for this visit. General: in no acute distress. Head: Normocephalic and atraumatic. Eyes:  Sclerae non-icteric, conjunctive pink. Ears: Normal auditory acuity. Mouth: Dentition intact. No ulcers or lesions.  Neck: Supple, no lymphadenopathy or thyromegaly.  Lungs: Clear bilaterally to auscultation without wheezes, crackles or rhonchi. Heart: Regular rate and rhythm. No murmur, rub or gallop appreciated.  Abdomen: Soft, nontender, nondistended. No masses. No hepatosplenomegaly. Normoactive bowel sounds x 4 quadrants.  Rectal: Deferred.  Musculoskeletal: Symmetrical with no gross deformities. Skin: Warm and dry. No rash or lesions on visible extremities. Extremities: No edema. Neurological: Alert oriented x 4, no focal deficits.  Psychological:  Alert and cooperative. Normal mood and affect.  ASSESSMENT AND PLAN:    CC:  Tally Joe, MD
